# Patient Record
Sex: Female | Born: 1970
Health system: Southern US, Community
[De-identification: ages and names within clinical notes are randomized; demographics above are authoritative.]

## PROBLEM LIST (undated history)

## (undated) DIAGNOSIS — G4700 Insomnia, unspecified: Secondary | ICD-10-CM

## (undated) DIAGNOSIS — Z8719 Personal history of other diseases of the digestive system: Secondary | ICD-10-CM

## (undated) DIAGNOSIS — K219 Gastro-esophageal reflux disease without esophagitis: Secondary | ICD-10-CM

## (undated) DIAGNOSIS — E785 Hyperlipidemia, unspecified: Secondary | ICD-10-CM

## (undated) DIAGNOSIS — M722 Plantar fascial fibromatosis: Secondary | ICD-10-CM

## (undated) DIAGNOSIS — K0889 Other specified disorders of teeth and supporting structures: Secondary | ICD-10-CM

## (undated) DIAGNOSIS — M1990 Unspecified osteoarthritis, unspecified site: Secondary | ICD-10-CM

## (undated) DIAGNOSIS — H3530 Unspecified macular degeneration: Secondary | ICD-10-CM

## (undated) DIAGNOSIS — F329 Major depressive disorder, single episode, unspecified: Secondary | ICD-10-CM

## (undated) DIAGNOSIS — F32A Depression, unspecified: Secondary | ICD-10-CM

## (undated) DIAGNOSIS — Z9889 Other specified postprocedural states: Secondary | ICD-10-CM

## (undated) DIAGNOSIS — F419 Anxiety disorder, unspecified: Secondary | ICD-10-CM

## (undated) DIAGNOSIS — K759 Inflammatory liver disease, unspecified: Secondary | ICD-10-CM

## (undated) DIAGNOSIS — G8929 Other chronic pain: Secondary | ICD-10-CM

## (undated) DIAGNOSIS — I10 Essential (primary) hypertension: Secondary | ICD-10-CM

## (undated) HISTORY — DX: Other specified postprocedural states: Z98.890

## (undated) HISTORY — PX: COLONOSCOPY: SHX174

## (undated) HISTORY — DX: Unspecified macular degeneration: H35.30

## (undated) HISTORY — DX: Major depressive disorder, single episode, unspecified: F32.9

## (undated) HISTORY — DX: Gastro-esophageal reflux disease without esophagitis: K21.9

## (undated) HISTORY — PX: OTHER SURGICAL HISTORY: SHX169

## (undated) HISTORY — DX: Hyperlipidemia, unspecified: E78.5

## (undated) HISTORY — DX: Other chronic pain: G89.29

## (undated) HISTORY — DX: Plantar fascial fibromatosis: M72.2

## (undated) HISTORY — DX: Personal history of other diseases of the digestive system: Z87.19

## (undated) HISTORY — DX: Insomnia, unspecified: G47.00

## (undated) HISTORY — DX: Essential (primary) hypertension: I10

## (undated) HISTORY — DX: Depression, unspecified: F32.A

## (undated) HISTORY — DX: Anxiety disorder, unspecified: F41.9

## (undated) HISTORY — DX: Other specified disorders of teeth and supporting structures: K08.89

---
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
## 1997-09-13 HISTORY — PX: OTHER SURGICAL HISTORY: SHX169

## 1997-11-16 ENCOUNTER — Inpatient Hospital Stay (HOSPITAL_COMMUNITY): Admission: AD | Admit: 1997-11-16 | Discharge: 1997-11-16 | Payer: Self-pay | Admitting: *Deleted

## 1997-12-17 ENCOUNTER — Inpatient Hospital Stay (HOSPITAL_COMMUNITY): Admission: AD | Admit: 1997-12-17 | Discharge: 1997-12-17 | Payer: Self-pay | Admitting: *Deleted

## 1997-12-20 ENCOUNTER — Other Ambulatory Visit: Admission: RE | Admit: 1997-12-20 | Discharge: 1997-12-20 | Payer: Self-pay | Admitting: *Deleted

## 1997-12-24 ENCOUNTER — Inpatient Hospital Stay (HOSPITAL_COMMUNITY): Admission: AD | Admit: 1997-12-24 | Discharge: 1997-12-24 | Payer: Self-pay | Admitting: *Deleted

## 1997-12-26 ENCOUNTER — Inpatient Hospital Stay (HOSPITAL_COMMUNITY): Admission: AD | Admit: 1997-12-26 | Discharge: 1997-12-29 | Payer: Self-pay | Admitting: Obstetrics

## 1998-03-01 ENCOUNTER — Emergency Department (HOSPITAL_COMMUNITY): Admission: EM | Admit: 1998-03-01 | Discharge: 1998-03-01 | Payer: Self-pay | Admitting: Emergency Medicine

## 1998-03-19 ENCOUNTER — Inpatient Hospital Stay (HOSPITAL_COMMUNITY): Admission: AD | Admit: 1998-03-19 | Discharge: 1998-03-19 | Payer: Self-pay | Admitting: Obstetrics

## 1998-03-19 ENCOUNTER — Emergency Department (HOSPITAL_COMMUNITY): Admission: EM | Admit: 1998-03-19 | Discharge: 1998-03-19 | Payer: Self-pay | Admitting: Emergency Medicine

## 1998-04-03 ENCOUNTER — Ambulatory Visit (HOSPITAL_COMMUNITY): Admission: RE | Admit: 1998-04-03 | Discharge: 1998-04-03 | Payer: Self-pay | Admitting: *Deleted

## 1998-04-11 ENCOUNTER — Ambulatory Visit (HOSPITAL_COMMUNITY): Admission: RE | Admit: 1998-04-11 | Discharge: 1998-04-11 | Payer: Self-pay | Admitting: *Deleted

## 1998-04-14 ENCOUNTER — Ambulatory Visit (HOSPITAL_COMMUNITY): Admission: RE | Admit: 1998-04-14 | Discharge: 1998-04-14 | Payer: Self-pay | Admitting: *Deleted

## 1998-05-05 ENCOUNTER — Encounter: Admission: RE | Admit: 1998-05-05 | Discharge: 1998-05-05 | Payer: Self-pay | Admitting: Internal Medicine

## 1998-05-06 ENCOUNTER — Encounter: Admission: RE | Admit: 1998-05-06 | Discharge: 1998-05-06 | Payer: Self-pay | Admitting: Internal Medicine

## 1998-05-10 ENCOUNTER — Emergency Department (HOSPITAL_COMMUNITY): Admission: EM | Admit: 1998-05-10 | Discharge: 1998-05-10 | Payer: Self-pay | Admitting: Emergency Medicine

## 1998-05-13 ENCOUNTER — Ambulatory Visit (HOSPITAL_COMMUNITY): Admission: RE | Admit: 1998-05-13 | Discharge: 1998-05-13 | Payer: Self-pay | Admitting: *Deleted

## 1998-05-22 ENCOUNTER — Ambulatory Visit (HOSPITAL_COMMUNITY): Admission: RE | Admit: 1998-05-22 | Discharge: 1998-05-22 | Payer: Self-pay | Admitting: *Deleted

## 1998-05-22 ENCOUNTER — Encounter: Payer: Self-pay | Admitting: *Deleted

## 1998-05-25 ENCOUNTER — Emergency Department (HOSPITAL_COMMUNITY): Admission: EM | Admit: 1998-05-25 | Discharge: 1998-05-25 | Payer: Self-pay | Admitting: Emergency Medicine

## 1998-05-26 ENCOUNTER — Ambulatory Visit (HOSPITAL_COMMUNITY): Admission: RE | Admit: 1998-05-26 | Discharge: 1998-05-26 | Payer: Self-pay | Admitting: *Deleted

## 1998-06-07 ENCOUNTER — Emergency Department (HOSPITAL_COMMUNITY): Admission: EM | Admit: 1998-06-07 | Discharge: 1998-06-07 | Payer: Self-pay | Admitting: Emergency Medicine

## 1998-06-08 ENCOUNTER — Emergency Department (HOSPITAL_COMMUNITY): Admission: EM | Admit: 1998-06-08 | Discharge: 1998-06-08 | Payer: Self-pay | Admitting: Emergency Medicine

## 1998-06-17 ENCOUNTER — Encounter: Payer: Self-pay | Admitting: *Deleted

## 1998-06-17 ENCOUNTER — Ambulatory Visit (HOSPITAL_COMMUNITY): Admission: RE | Admit: 1998-06-17 | Discharge: 1998-06-17 | Payer: Self-pay | Admitting: *Deleted

## 1998-07-04 ENCOUNTER — Encounter: Payer: Self-pay | Admitting: *Deleted

## 1998-07-04 ENCOUNTER — Ambulatory Visit (HOSPITAL_COMMUNITY): Admission: RE | Admit: 1998-07-04 | Discharge: 1998-07-04 | Payer: Self-pay | Admitting: *Deleted

## 1998-07-05 ENCOUNTER — Inpatient Hospital Stay (HOSPITAL_COMMUNITY): Admission: AD | Admit: 1998-07-05 | Discharge: 1998-07-05 | Payer: Self-pay | Admitting: *Deleted

## 1998-07-10 ENCOUNTER — Encounter: Admission: RE | Admit: 1998-07-10 | Discharge: 1998-07-10 | Payer: Self-pay | Admitting: Obstetrics

## 1998-07-18 ENCOUNTER — Ambulatory Visit (HOSPITAL_COMMUNITY): Admission: RE | Admit: 1998-07-18 | Discharge: 1998-07-18 | Payer: Self-pay | Admitting: *Deleted

## 1998-07-24 ENCOUNTER — Inpatient Hospital Stay (HOSPITAL_COMMUNITY): Admission: AD | Admit: 1998-07-24 | Discharge: 1998-07-24 | Payer: Self-pay | Admitting: Obstetrics & Gynecology

## 1998-08-04 ENCOUNTER — Encounter: Payer: Self-pay | Admitting: Emergency Medicine

## 1998-08-04 ENCOUNTER — Emergency Department (HOSPITAL_COMMUNITY): Admission: EM | Admit: 1998-08-04 | Discharge: 1998-08-04 | Payer: Self-pay | Admitting: Emergency Medicine

## 1998-08-08 ENCOUNTER — Encounter: Payer: Self-pay | Admitting: Obstetrics & Gynecology

## 1998-08-08 ENCOUNTER — Inpatient Hospital Stay (HOSPITAL_COMMUNITY): Admission: AD | Admit: 1998-08-08 | Discharge: 1998-08-08 | Payer: Self-pay | Admitting: Obstetrics & Gynecology

## 1998-08-16 ENCOUNTER — Emergency Department (HOSPITAL_COMMUNITY): Admission: EM | Admit: 1998-08-16 | Discharge: 1998-08-16 | Payer: Self-pay | Admitting: Emergency Medicine

## 1998-08-21 ENCOUNTER — Encounter: Admission: RE | Admit: 1998-08-21 | Discharge: 1998-08-21 | Payer: Self-pay | Admitting: Obstetrics

## 1998-08-22 ENCOUNTER — Ambulatory Visit (HOSPITAL_COMMUNITY): Admission: RE | Admit: 1998-08-22 | Discharge: 1998-08-22 | Payer: Self-pay | Admitting: *Deleted

## 1998-08-22 ENCOUNTER — Encounter: Payer: Self-pay | Admitting: *Deleted

## 1998-09-02 ENCOUNTER — Ambulatory Visit (HOSPITAL_COMMUNITY): Admission: RE | Admit: 1998-09-02 | Discharge: 1998-09-02 | Payer: Self-pay | Admitting: *Deleted

## 1998-10-31 ENCOUNTER — Ambulatory Visit (HOSPITAL_COMMUNITY): Admission: RE | Admit: 1998-10-31 | Discharge: 1998-10-31 | Payer: Self-pay | Admitting: *Deleted

## 1998-10-31 ENCOUNTER — Encounter: Payer: Self-pay | Admitting: *Deleted

## 1998-11-07 ENCOUNTER — Ambulatory Visit (HOSPITAL_COMMUNITY): Admission: RE | Admit: 1998-11-07 | Discharge: 1998-11-07 | Payer: Self-pay | Admitting: *Deleted

## 1998-11-21 ENCOUNTER — Emergency Department (HOSPITAL_COMMUNITY): Admission: EM | Admit: 1998-11-21 | Discharge: 1998-11-22 | Payer: Self-pay | Admitting: Emergency Medicine

## 1998-11-22 ENCOUNTER — Encounter: Payer: Self-pay | Admitting: Emergency Medicine

## 1998-12-16 ENCOUNTER — Encounter: Admission: RE | Admit: 1998-12-16 | Discharge: 1998-12-16 | Payer: Self-pay | Admitting: Hematology and Oncology

## 1999-02-17 ENCOUNTER — Encounter: Admission: RE | Admit: 1999-02-17 | Discharge: 1999-02-17 | Payer: Self-pay | Admitting: Internal Medicine

## 1999-02-24 ENCOUNTER — Ambulatory Visit (HOSPITAL_COMMUNITY): Admission: RE | Admit: 1999-02-24 | Discharge: 1999-02-24 | Payer: Self-pay | Admitting: *Deleted

## 1999-03-19 ENCOUNTER — Encounter: Admission: RE | Admit: 1999-03-19 | Discharge: 1999-03-19 | Payer: Self-pay | Admitting: Obstetrics

## 1999-04-02 ENCOUNTER — Encounter: Admission: RE | Admit: 1999-04-02 | Discharge: 1999-04-02 | Payer: Self-pay | Admitting: Obstetrics

## 1999-04-24 ENCOUNTER — Encounter: Admission: RE | Admit: 1999-04-24 | Discharge: 1999-04-24 | Payer: Self-pay | Admitting: Internal Medicine

## 1999-07-02 ENCOUNTER — Encounter: Admission: RE | Admit: 1999-07-02 | Discharge: 1999-07-02 | Payer: Self-pay | Admitting: Obstetrics

## 1999-07-17 ENCOUNTER — Ambulatory Visit (HOSPITAL_COMMUNITY): Admission: RE | Admit: 1999-07-17 | Discharge: 1999-07-17 | Payer: Self-pay | Admitting: *Deleted

## 1999-09-17 ENCOUNTER — Encounter: Admission: RE | Admit: 1999-09-17 | Discharge: 1999-09-17 | Payer: Self-pay | Admitting: Obstetrics

## 1999-09-29 ENCOUNTER — Other Ambulatory Visit: Admission: RE | Admit: 1999-09-29 | Discharge: 1999-09-29 | Payer: Self-pay | Admitting: Obstetrics

## 1999-09-29 ENCOUNTER — Encounter: Admission: RE | Admit: 1999-09-29 | Discharge: 1999-09-29 | Payer: Self-pay | Admitting: Obstetrics & Gynecology

## 1999-12-24 ENCOUNTER — Encounter: Admission: RE | Admit: 1999-12-24 | Discharge: 1999-12-24 | Payer: Self-pay | Admitting: Obstetrics

## 2000-02-06 ENCOUNTER — Inpatient Hospital Stay (HOSPITAL_COMMUNITY): Admission: AD | Admit: 2000-02-06 | Discharge: 2000-02-06 | Payer: Self-pay | Admitting: *Deleted

## 2000-03-24 ENCOUNTER — Encounter: Payer: Self-pay | Admitting: Emergency Medicine

## 2000-03-24 ENCOUNTER — Encounter: Admission: RE | Admit: 2000-03-24 | Discharge: 2000-03-24 | Payer: Self-pay | Admitting: Obstetrics

## 2000-03-24 ENCOUNTER — Emergency Department (HOSPITAL_COMMUNITY): Admission: EM | Admit: 2000-03-24 | Discharge: 2000-03-24 | Payer: Self-pay | Admitting: Emergency Medicine

## 2000-05-18 ENCOUNTER — Encounter: Admission: RE | Admit: 2000-05-18 | Discharge: 2000-05-18 | Payer: Self-pay | Admitting: Hematology and Oncology

## 2000-05-27 ENCOUNTER — Ambulatory Visit (HOSPITAL_COMMUNITY): Admission: RE | Admit: 2000-05-27 | Discharge: 2000-05-27 | Payer: Self-pay | Admitting: *Deleted

## 2000-06-06 ENCOUNTER — Ambulatory Visit (HOSPITAL_COMMUNITY): Admission: RE | Admit: 2000-06-06 | Discharge: 2000-06-06 | Payer: Self-pay | Admitting: *Deleted

## 2000-06-21 ENCOUNTER — Ambulatory Visit (HOSPITAL_COMMUNITY): Admission: RE | Admit: 2000-06-21 | Discharge: 2000-06-21 | Payer: Self-pay | Admitting: Internal Medicine

## 2000-08-02 ENCOUNTER — Ambulatory Visit (HOSPITAL_COMMUNITY): Admission: RE | Admit: 2000-08-02 | Discharge: 2000-08-02 | Payer: Self-pay | Admitting: *Deleted

## 2000-08-25 ENCOUNTER — Encounter: Admission: RE | Admit: 2000-08-25 | Discharge: 2000-08-25 | Payer: Self-pay | Admitting: Internal Medicine

## 2000-09-08 ENCOUNTER — Encounter: Admission: RE | Admit: 2000-09-08 | Discharge: 2000-09-08 | Payer: Self-pay | Admitting: Internal Medicine

## 2000-09-14 ENCOUNTER — Encounter: Payer: Self-pay | Admitting: Internal Medicine

## 2000-09-14 ENCOUNTER — Ambulatory Visit (HOSPITAL_COMMUNITY): Admission: RE | Admit: 2000-09-14 | Discharge: 2000-09-14 | Payer: Self-pay | Admitting: Internal Medicine

## 2000-09-27 ENCOUNTER — Other Ambulatory Visit: Admission: RE | Admit: 2000-09-27 | Discharge: 2000-09-27 | Payer: Self-pay | Admitting: Obstetrics & Gynecology

## 2000-09-27 ENCOUNTER — Encounter: Admission: RE | Admit: 2000-09-27 | Discharge: 2000-09-27 | Payer: Self-pay | Admitting: Obstetrics & Gynecology

## 2000-11-03 ENCOUNTER — Emergency Department (HOSPITAL_COMMUNITY): Admission: EM | Admit: 2000-11-03 | Discharge: 2000-11-04 | Payer: Self-pay | Admitting: Emergency Medicine

## 2000-11-09 ENCOUNTER — Encounter: Admission: RE | Admit: 2000-11-09 | Discharge: 2000-11-09 | Payer: Self-pay | Admitting: Internal Medicine

## 2000-11-26 ENCOUNTER — Emergency Department (HOSPITAL_COMMUNITY): Admission: EM | Admit: 2000-11-26 | Discharge: 2000-11-26 | Payer: Self-pay | Admitting: *Deleted

## 2000-12-10 ENCOUNTER — Emergency Department (HOSPITAL_COMMUNITY): Admission: EM | Admit: 2000-12-10 | Discharge: 2000-12-10 | Payer: Self-pay | Admitting: Emergency Medicine

## 2000-12-13 ENCOUNTER — Emergency Department (HOSPITAL_COMMUNITY): Admission: EM | Admit: 2000-12-13 | Discharge: 2000-12-14 | Payer: Self-pay | Admitting: Emergency Medicine

## 2000-12-15 ENCOUNTER — Encounter: Payer: Self-pay | Admitting: Internal Medicine

## 2000-12-15 ENCOUNTER — Encounter: Admission: RE | Admit: 2000-12-15 | Discharge: 2000-12-15 | Payer: Self-pay | Admitting: Internal Medicine

## 2000-12-15 ENCOUNTER — Inpatient Hospital Stay (HOSPITAL_COMMUNITY): Admission: AD | Admit: 2000-12-15 | Discharge: 2000-12-18 | Payer: Self-pay | Admitting: Internal Medicine

## 2000-12-23 ENCOUNTER — Encounter: Admission: RE | Admit: 2000-12-23 | Discharge: 2000-12-23 | Payer: Self-pay

## 2000-12-30 ENCOUNTER — Encounter: Admission: RE | Admit: 2000-12-30 | Discharge: 2000-12-30 | Payer: Self-pay

## 2001-01-13 ENCOUNTER — Encounter: Admission: RE | Admit: 2001-01-13 | Discharge: 2001-01-13 | Payer: Self-pay | Admitting: Internal Medicine

## 2001-01-20 ENCOUNTER — Encounter: Admission: RE | Admit: 2001-01-20 | Discharge: 2001-01-20 | Payer: Self-pay | Admitting: Internal Medicine

## 2001-03-13 ENCOUNTER — Ambulatory Visit (HOSPITAL_COMMUNITY): Admission: RE | Admit: 2001-03-13 | Discharge: 2001-03-13 | Payer: Self-pay | Admitting: *Deleted

## 2001-03-24 ENCOUNTER — Encounter: Admission: RE | Admit: 2001-03-24 | Discharge: 2001-03-24 | Payer: Self-pay | Admitting: Obstetrics & Gynecology

## 2001-03-28 ENCOUNTER — Encounter: Admission: RE | Admit: 2001-03-28 | Discharge: 2001-03-28 | Payer: Self-pay | Admitting: Internal Medicine

## 2001-04-12 ENCOUNTER — Encounter: Admission: RE | Admit: 2001-04-12 | Discharge: 2001-04-12 | Payer: Self-pay | Admitting: Internal Medicine

## 2001-07-10 ENCOUNTER — Encounter: Admission: RE | Admit: 2001-07-10 | Discharge: 2001-07-10 | Payer: Self-pay

## 2001-07-31 ENCOUNTER — Ambulatory Visit (HOSPITAL_COMMUNITY): Admission: RE | Admit: 2001-07-31 | Discharge: 2001-07-31 | Payer: Self-pay | Admitting: *Deleted

## 2001-07-31 ENCOUNTER — Encounter: Payer: Self-pay | Admitting: *Deleted

## 2001-08-21 ENCOUNTER — Encounter: Admission: RE | Admit: 2001-08-21 | Discharge: 2001-08-21 | Payer: Self-pay | Admitting: Internal Medicine

## 2001-10-03 ENCOUNTER — Ambulatory Visit (HOSPITAL_COMMUNITY): Admission: RE | Admit: 2001-10-03 | Discharge: 2001-10-03 | Payer: Self-pay

## 2001-10-03 ENCOUNTER — Encounter: Payer: Self-pay | Admitting: Internal Medicine

## 2001-10-03 ENCOUNTER — Encounter: Admission: RE | Admit: 2001-10-03 | Discharge: 2001-10-03 | Payer: Self-pay | Admitting: Obstetrics & Gynecology

## 2001-11-30 ENCOUNTER — Encounter: Admission: RE | Admit: 2001-11-30 | Discharge: 2001-11-30 | Payer: Self-pay | Admitting: Internal Medicine

## 2001-12-12 ENCOUNTER — Ambulatory Visit (HOSPITAL_COMMUNITY): Admission: RE | Admit: 2001-12-12 | Discharge: 2001-12-12 | Payer: Self-pay | Admitting: *Deleted

## 2001-12-19 ENCOUNTER — Ambulatory Visit (HOSPITAL_COMMUNITY): Admission: RE | Admit: 2001-12-19 | Discharge: 2001-12-19 | Payer: Self-pay | Admitting: *Deleted

## 2001-12-26 ENCOUNTER — Encounter: Admission: RE | Admit: 2001-12-26 | Discharge: 2001-12-26 | Payer: Self-pay | Admitting: *Deleted

## 2001-12-28 ENCOUNTER — Encounter: Admission: RE | Admit: 2001-12-28 | Discharge: 2001-12-28 | Payer: Self-pay | Admitting: *Deleted

## 2002-02-05 ENCOUNTER — Encounter: Payer: Self-pay | Admitting: Internal Medicine

## 2002-02-05 ENCOUNTER — Ambulatory Visit (HOSPITAL_COMMUNITY): Admission: RE | Admit: 2002-02-05 | Discharge: 2002-02-05 | Payer: Self-pay | Admitting: Internal Medicine

## 2002-02-05 ENCOUNTER — Encounter: Admission: RE | Admit: 2002-02-05 | Discharge: 2002-02-05 | Payer: Self-pay | Admitting: Internal Medicine

## 2002-02-06 ENCOUNTER — Encounter: Admission: RE | Admit: 2002-02-06 | Discharge: 2002-02-06 | Payer: Self-pay | Admitting: Internal Medicine

## 2002-03-06 ENCOUNTER — Encounter: Admission: RE | Admit: 2002-03-06 | Discharge: 2002-03-06 | Payer: Self-pay | Admitting: Internal Medicine

## 2002-03-26 ENCOUNTER — Encounter: Admission: RE | Admit: 2002-03-26 | Discharge: 2002-03-26 | Payer: Self-pay | Admitting: Internal Medicine

## 2002-04-09 ENCOUNTER — Encounter: Admission: RE | Admit: 2002-04-09 | Discharge: 2002-04-09 | Payer: Self-pay | Admitting: Internal Medicine

## 2002-07-05 ENCOUNTER — Encounter: Admission: RE | Admit: 2002-07-05 | Discharge: 2002-07-05 | Payer: Self-pay | Admitting: Obstetrics and Gynecology

## 2002-12-02 ENCOUNTER — Emergency Department (HOSPITAL_COMMUNITY): Admission: EM | Admit: 2002-12-02 | Discharge: 2002-12-03 | Payer: Self-pay | Admitting: Emergency Medicine

## 2003-02-15 ENCOUNTER — Encounter: Admission: RE | Admit: 2003-02-15 | Discharge: 2003-02-15 | Payer: Self-pay | Admitting: Internal Medicine

## 2003-03-11 ENCOUNTER — Encounter: Admission: RE | Admit: 2003-03-11 | Discharge: 2003-03-11 | Payer: Self-pay | Admitting: Internal Medicine

## 2003-05-16 ENCOUNTER — Encounter: Admission: RE | Admit: 2003-05-16 | Discharge: 2003-05-16 | Payer: Self-pay | Admitting: Internal Medicine

## 2003-05-16 ENCOUNTER — Encounter: Admission: RE | Admit: 2003-05-16 | Discharge: 2003-05-16 | Payer: Self-pay | Admitting: Obstetrics and Gynecology

## 2003-07-18 ENCOUNTER — Encounter: Admission: RE | Admit: 2003-07-18 | Discharge: 2003-07-18 | Payer: Self-pay | Admitting: Internal Medicine

## 2003-07-18 ENCOUNTER — Ambulatory Visit (HOSPITAL_COMMUNITY): Admission: RE | Admit: 2003-07-18 | Discharge: 2003-07-18 | Payer: Self-pay | Admitting: Internal Medicine

## 2003-07-30 ENCOUNTER — Encounter: Admission: RE | Admit: 2003-07-30 | Discharge: 2003-10-28 | Payer: Self-pay | Admitting: Internal Medicine

## 2003-08-30 ENCOUNTER — Encounter: Admission: RE | Admit: 2003-08-30 | Discharge: 2003-08-30 | Payer: Self-pay | Admitting: Internal Medicine

## 2003-09-14 HISTORY — PX: OTHER SURGICAL HISTORY: SHX169

## 2003-09-23 ENCOUNTER — Encounter: Admission: RE | Admit: 2003-09-23 | Discharge: 2003-09-23 | Payer: Self-pay | Admitting: Internal Medicine

## 2003-10-02 ENCOUNTER — Encounter: Admission: RE | Admit: 2003-10-02 | Discharge: 2003-10-02 | Payer: Self-pay | Admitting: Internal Medicine

## 2003-11-01 ENCOUNTER — Encounter: Admission: RE | Admit: 2003-11-01 | Discharge: 2003-11-01 | Payer: Self-pay | Admitting: Internal Medicine

## 2003-11-06 ENCOUNTER — Ambulatory Visit (HOSPITAL_COMMUNITY): Admission: RE | Admit: 2003-11-06 | Discharge: 2003-11-06 | Payer: Self-pay | Admitting: Gastroenterology

## 2003-11-13 ENCOUNTER — Ambulatory Visit (HOSPITAL_COMMUNITY): Admission: RE | Admit: 2003-11-13 | Discharge: 2003-11-13 | Payer: Self-pay | Admitting: Gastroenterology

## 2003-11-29 ENCOUNTER — Encounter: Admission: RE | Admit: 2003-11-29 | Discharge: 2004-02-27 | Payer: Self-pay | Admitting: Orthopedic Surgery

## 2004-01-08 ENCOUNTER — Ambulatory Visit (HOSPITAL_BASED_OUTPATIENT_CLINIC_OR_DEPARTMENT_OTHER): Admission: RE | Admit: 2004-01-08 | Discharge: 2004-01-08 | Payer: Self-pay | Admitting: Orthopedic Surgery

## 2004-05-14 ENCOUNTER — Ambulatory Visit (HOSPITAL_COMMUNITY): Admission: RE | Admit: 2004-05-14 | Discharge: 2004-05-14 | Payer: Self-pay | Admitting: Internal Medicine

## 2004-05-14 ENCOUNTER — Ambulatory Visit: Payer: Self-pay | Admitting: Internal Medicine

## 2004-05-28 ENCOUNTER — Ambulatory Visit: Payer: Self-pay | Admitting: Internal Medicine

## 2004-06-15 ENCOUNTER — Ambulatory Visit: Payer: Self-pay | Admitting: Internal Medicine

## 2004-07-16 ENCOUNTER — Ambulatory Visit: Payer: Self-pay | Admitting: Internal Medicine

## 2004-07-31 ENCOUNTER — Ambulatory Visit: Payer: Self-pay | Admitting: Family Medicine

## 2004-08-17 ENCOUNTER — Ambulatory Visit: Payer: Self-pay | Admitting: Internal Medicine

## 2004-08-24 ENCOUNTER — Ambulatory Visit: Payer: Self-pay | Admitting: Internal Medicine

## 2004-09-02 ENCOUNTER — Ambulatory Visit: Payer: Self-pay | Admitting: Internal Medicine

## 2004-10-27 ENCOUNTER — Ambulatory Visit (HOSPITAL_COMMUNITY): Admission: RE | Admit: 2004-10-27 | Discharge: 2004-10-27 | Payer: Self-pay | Admitting: Gastroenterology

## 2004-11-18 ENCOUNTER — Ambulatory Visit (HOSPITAL_COMMUNITY): Admission: RE | Admit: 2004-11-18 | Discharge: 2004-11-18 | Payer: Self-pay | Admitting: Gastroenterology

## 2004-12-10 ENCOUNTER — Emergency Department (HOSPITAL_COMMUNITY): Admission: EM | Admit: 2004-12-10 | Discharge: 2004-12-10 | Payer: Self-pay | Admitting: Family Medicine

## 2004-12-11 ENCOUNTER — Emergency Department (HOSPITAL_COMMUNITY): Admission: EM | Admit: 2004-12-11 | Discharge: 2004-12-11 | Payer: Self-pay | Admitting: Family Medicine

## 2005-02-03 ENCOUNTER — Ambulatory Visit: Payer: Self-pay | Admitting: Internal Medicine

## 2005-02-17 ENCOUNTER — Ambulatory Visit: Payer: Self-pay | Admitting: Internal Medicine

## 2005-02-18 ENCOUNTER — Ambulatory Visit (HOSPITAL_COMMUNITY): Admission: RE | Admit: 2005-02-18 | Discharge: 2005-02-18 | Payer: Self-pay | Admitting: Internal Medicine

## 2005-02-19 ENCOUNTER — Ambulatory Visit: Payer: Self-pay | Admitting: Internal Medicine

## 2005-04-15 ENCOUNTER — Encounter: Admission: RE | Admit: 2005-04-15 | Discharge: 2005-05-10 | Payer: Self-pay | Admitting: Internal Medicine

## 2005-05-04 ENCOUNTER — Emergency Department (HOSPITAL_COMMUNITY): Admission: EM | Admit: 2005-05-04 | Discharge: 2005-05-04 | Payer: Self-pay | Admitting: Family Medicine

## 2005-05-11 ENCOUNTER — Ambulatory Visit: Payer: Self-pay | Admitting: Internal Medicine

## 2005-05-11 ENCOUNTER — Ambulatory Visit (HOSPITAL_COMMUNITY): Admission: RE | Admit: 2005-05-11 | Discharge: 2005-05-11 | Payer: Self-pay | Admitting: Internal Medicine

## 2005-05-19 ENCOUNTER — Ambulatory Visit: Payer: Self-pay | Admitting: Internal Medicine

## 2005-06-08 ENCOUNTER — Encounter: Admission: RE | Admit: 2005-06-08 | Discharge: 2005-08-11 | Payer: Self-pay | Admitting: Orthopedic Surgery

## 2005-06-09 ENCOUNTER — Inpatient Hospital Stay (HOSPITAL_COMMUNITY): Admission: AD | Admit: 2005-06-09 | Discharge: 2005-06-09 | Payer: Self-pay | Admitting: Obstetrics and Gynecology

## 2005-06-09 ENCOUNTER — Ambulatory Visit: Payer: Self-pay | Admitting: Internal Medicine

## 2005-07-13 ENCOUNTER — Ambulatory Visit: Payer: Self-pay | Admitting: Obstetrics and Gynecology

## 2005-08-12 ENCOUNTER — Encounter (INDEPENDENT_AMBULATORY_CARE_PROVIDER_SITE_OTHER): Payer: Self-pay | Admitting: Internal Medicine

## 2005-08-12 ENCOUNTER — Ambulatory Visit: Payer: Self-pay | Admitting: Obstetrics and Gynecology

## 2005-12-15 ENCOUNTER — Ambulatory Visit: Payer: Self-pay | Admitting: Internal Medicine

## 2006-05-05 ENCOUNTER — Ambulatory Visit (HOSPITAL_COMMUNITY): Admission: RE | Admit: 2006-05-05 | Discharge: 2006-05-05 | Payer: Self-pay | Admitting: Gastroenterology

## 2006-05-12 ENCOUNTER — Ambulatory Visit: Payer: Self-pay | Admitting: Internal Medicine

## 2006-05-20 ENCOUNTER — Ambulatory Visit (HOSPITAL_COMMUNITY): Admission: RE | Admit: 2006-05-20 | Discharge: 2006-05-20 | Payer: Self-pay | Admitting: Hospitalist

## 2006-05-20 ENCOUNTER — Ambulatory Visit: Payer: Self-pay | Admitting: Hospitalist

## 2006-06-16 ENCOUNTER — Ambulatory Visit (HOSPITAL_COMMUNITY): Admission: RE | Admit: 2006-06-16 | Discharge: 2006-06-16 | Payer: Self-pay | Admitting: Gastroenterology

## 2006-07-07 ENCOUNTER — Ambulatory Visit: Payer: Self-pay | Admitting: Internal Medicine

## 2006-10-12 ENCOUNTER — Encounter (INDEPENDENT_AMBULATORY_CARE_PROVIDER_SITE_OTHER): Payer: Self-pay | Admitting: Internal Medicine

## 2006-10-12 DIAGNOSIS — G4700 Insomnia, unspecified: Secondary | ICD-10-CM | POA: Insufficient documentation

## 2006-10-12 DIAGNOSIS — K222 Esophageal obstruction: Secondary | ICD-10-CM | POA: Insufficient documentation

## 2006-10-12 DIAGNOSIS — F3289 Other specified depressive episodes: Secondary | ICD-10-CM | POA: Insufficient documentation

## 2006-10-12 DIAGNOSIS — K219 Gastro-esophageal reflux disease without esophagitis: Secondary | ICD-10-CM | POA: Insufficient documentation

## 2006-10-12 DIAGNOSIS — F17200 Nicotine dependence, unspecified, uncomplicated: Secondary | ICD-10-CM

## 2006-10-12 DIAGNOSIS — F329 Major depressive disorder, single episode, unspecified: Secondary | ICD-10-CM | POA: Insufficient documentation

## 2006-10-12 DIAGNOSIS — F411 Generalized anxiety disorder: Secondary | ICD-10-CM | POA: Insufficient documentation

## 2006-10-13 ENCOUNTER — Ambulatory Visit: Payer: Self-pay | Admitting: Internal Medicine

## 2006-10-27 ENCOUNTER — Ambulatory Visit: Payer: Self-pay | Admitting: Obstetrics & Gynecology

## 2006-10-27 ENCOUNTER — Encounter (INDEPENDENT_AMBULATORY_CARE_PROVIDER_SITE_OTHER): Payer: Self-pay | Admitting: *Deleted

## 2006-11-03 ENCOUNTER — Ambulatory Visit (HOSPITAL_COMMUNITY): Admission: RE | Admit: 2006-11-03 | Discharge: 2006-11-03 | Payer: Self-pay | Admitting: Hospitalist

## 2006-11-03 ENCOUNTER — Ambulatory Visit: Payer: Self-pay | Admitting: Hospitalist

## 2006-11-09 ENCOUNTER — Ambulatory Visit: Payer: Self-pay | Admitting: Internal Medicine

## 2006-11-09 ENCOUNTER — Encounter (INDEPENDENT_AMBULATORY_CARE_PROVIDER_SITE_OTHER): Payer: Self-pay | Admitting: Pulmonary Disease

## 2006-11-09 LAB — CONVERTED CEMR LAB
Cholesterol: 207 mg/dL — ABNORMAL HIGH (ref 0–200)
HDL: 45 mg/dL (ref >39)
LDL Cholesterol: 143 mg/dL — ABNORMAL HIGH (ref 0–99)
Triglycerides: 96 mg/dL (ref <150)
VLDL: 19 mg/dL (ref 0–40)

## 2006-11-23 ENCOUNTER — Ambulatory Visit: Payer: Self-pay | Admitting: *Deleted

## 2006-12-06 ENCOUNTER — Encounter (INDEPENDENT_AMBULATORY_CARE_PROVIDER_SITE_OTHER): Payer: Self-pay | Admitting: Internal Medicine

## 2006-12-27 ENCOUNTER — Encounter (INDEPENDENT_AMBULATORY_CARE_PROVIDER_SITE_OTHER): Payer: Self-pay | Admitting: Internal Medicine

## 2007-01-06 ENCOUNTER — Telehealth: Payer: Self-pay | Admitting: *Deleted

## 2007-01-16 ENCOUNTER — Ambulatory Visit (HOSPITAL_COMMUNITY): Admission: RE | Admit: 2007-01-16 | Discharge: 2007-01-16 | Payer: Self-pay | Admitting: *Deleted

## 2007-01-16 ENCOUNTER — Encounter (INDEPENDENT_AMBULATORY_CARE_PROVIDER_SITE_OTHER): Payer: Self-pay | Admitting: *Deleted

## 2007-01-16 ENCOUNTER — Telehealth: Payer: Self-pay | Admitting: *Deleted

## 2007-01-16 ENCOUNTER — Ambulatory Visit: Payer: Self-pay | Admitting: *Deleted

## 2007-01-17 ENCOUNTER — Encounter (INDEPENDENT_AMBULATORY_CARE_PROVIDER_SITE_OTHER): Payer: Self-pay | Admitting: *Deleted

## 2007-01-17 LAB — CONVERTED CEMR LAB
ALT: 21 units/L (ref 0–35)
AST: 11 units/L (ref 0–37)
Albumin: 4 g/dL (ref 3.5–5.2)
Alkaline Phosphatase: 57 units/L (ref 39–117)
BUN: 5 mg/dL — ABNORMAL LOW (ref 6–23)
Calcium: 9.1 mg/dL (ref 8.4–10.5)
Chloride: 100 meq/L (ref 96–112)
Creatinine, Ser: 0.57 mg/dL (ref 0.40–1.20)
MCHC: 33.8 g/dL (ref 30.0–36.0)
Platelets: 310 10*3/uL (ref 150–400)
Potassium: 3.3 meq/L — ABNORMAL LOW (ref 3.5–5.3)
RDW: 12.1 % (ref 11.5–14.0)

## 2007-01-19 ENCOUNTER — Ambulatory Visit (HOSPITAL_COMMUNITY): Admission: RE | Admit: 2007-01-19 | Discharge: 2007-01-19 | Payer: Self-pay | Admitting: *Deleted

## 2007-02-17 ENCOUNTER — Encounter (INDEPENDENT_AMBULATORY_CARE_PROVIDER_SITE_OTHER): Payer: Self-pay | Admitting: Internal Medicine

## 2007-02-17 ENCOUNTER — Encounter: Admission: RE | Admit: 2007-02-17 | Discharge: 2007-02-17 | Payer: Self-pay | Admitting: Gastroenterology

## 2007-02-17 ENCOUNTER — Ambulatory Visit: Payer: Self-pay | Admitting: Internal Medicine

## 2007-03-13 ENCOUNTER — Encounter (INDEPENDENT_AMBULATORY_CARE_PROVIDER_SITE_OTHER): Payer: Self-pay | Admitting: Internal Medicine

## 2007-05-18 ENCOUNTER — Ambulatory Visit (HOSPITAL_COMMUNITY): Admission: RE | Admit: 2007-05-18 | Discharge: 2007-05-18 | Payer: Self-pay | Admitting: Gastroenterology

## 2007-05-18 ENCOUNTER — Encounter (INDEPENDENT_AMBULATORY_CARE_PROVIDER_SITE_OTHER): Payer: Self-pay | Admitting: Gastroenterology

## 2007-05-18 ENCOUNTER — Encounter (INDEPENDENT_AMBULATORY_CARE_PROVIDER_SITE_OTHER): Payer: Self-pay | Admitting: Internal Medicine

## 2007-05-26 ENCOUNTER — Telehealth (INDEPENDENT_AMBULATORY_CARE_PROVIDER_SITE_OTHER): Payer: Self-pay | Admitting: Internal Medicine

## 2007-06-05 ENCOUNTER — Encounter (INDEPENDENT_AMBULATORY_CARE_PROVIDER_SITE_OTHER): Payer: Self-pay | Admitting: Internal Medicine

## 2007-08-17 ENCOUNTER — Ambulatory Visit: Payer: Self-pay | Admitting: Internal Medicine

## 2007-08-24 ENCOUNTER — Encounter (INDEPENDENT_AMBULATORY_CARE_PROVIDER_SITE_OTHER): Payer: Self-pay | Admitting: Internal Medicine

## 2007-08-24 ENCOUNTER — Ambulatory Visit: Payer: Self-pay | Admitting: Infectious Diseases

## 2007-09-22 ENCOUNTER — Encounter (INDEPENDENT_AMBULATORY_CARE_PROVIDER_SITE_OTHER): Payer: Self-pay | Admitting: Internal Medicine

## 2007-11-02 ENCOUNTER — Ambulatory Visit: Payer: Self-pay | Admitting: Obstetrics and Gynecology

## 2007-11-02 ENCOUNTER — Encounter: Payer: Self-pay | Admitting: Family Medicine

## 2008-04-22 ENCOUNTER — Emergency Department (HOSPITAL_COMMUNITY): Admission: EM | Admit: 2008-04-22 | Discharge: 2008-04-22 | Payer: Self-pay | Admitting: Family Medicine

## 2008-05-02 ENCOUNTER — Ambulatory Visit: Payer: Self-pay | Admitting: Obstetrics and Gynecology

## 2008-05-13 ENCOUNTER — Encounter (INDEPENDENT_AMBULATORY_CARE_PROVIDER_SITE_OTHER): Payer: Self-pay | Admitting: Internal Medicine

## 2008-05-13 ENCOUNTER — Telehealth: Payer: Self-pay | Admitting: *Deleted

## 2008-05-17 ENCOUNTER — Encounter (INDEPENDENT_AMBULATORY_CARE_PROVIDER_SITE_OTHER): Payer: Self-pay | Admitting: Internal Medicine

## 2008-05-17 ENCOUNTER — Emergency Department (HOSPITAL_COMMUNITY): Admission: EM | Admit: 2008-05-17 | Discharge: 2008-05-17 | Payer: Self-pay | Admitting: Family Medicine

## 2008-05-27 ENCOUNTER — Encounter (INDEPENDENT_AMBULATORY_CARE_PROVIDER_SITE_OTHER): Payer: Self-pay | Admitting: Internal Medicine

## 2008-07-12 ENCOUNTER — Encounter (INDEPENDENT_AMBULATORY_CARE_PROVIDER_SITE_OTHER): Payer: Self-pay | Admitting: Internal Medicine

## 2008-07-16 ENCOUNTER — Ambulatory Visit: Payer: Self-pay | Admitting: Internal Medicine

## 2008-07-19 ENCOUNTER — Ambulatory Visit (HOSPITAL_COMMUNITY): Admission: RE | Admit: 2008-07-19 | Discharge: 2008-07-19 | Payer: Self-pay | Admitting: Infectious Diseases

## 2008-07-19 ENCOUNTER — Ambulatory Visit: Payer: Self-pay | Admitting: Infectious Diseases

## 2008-07-23 ENCOUNTER — Telehealth (INDEPENDENT_AMBULATORY_CARE_PROVIDER_SITE_OTHER): Payer: Self-pay | Admitting: Internal Medicine

## 2008-07-26 ENCOUNTER — Ambulatory Visit (HOSPITAL_BASED_OUTPATIENT_CLINIC_OR_DEPARTMENT_OTHER): Admission: RE | Admit: 2008-07-26 | Discharge: 2008-07-26 | Payer: Self-pay | Admitting: Urology

## 2008-09-13 DIAGNOSIS — K0889 Other specified disorders of teeth and supporting structures: Secondary | ICD-10-CM

## 2008-09-13 HISTORY — DX: Other specified disorders of teeth and supporting structures: K08.89

## 2008-09-17 ENCOUNTER — Telehealth: Payer: Self-pay | Admitting: *Deleted

## 2008-09-24 ENCOUNTER — Encounter: Admission: RE | Admit: 2008-09-24 | Discharge: 2008-12-18 | Payer: Self-pay | Admitting: Urology

## 2008-10-02 ENCOUNTER — Encounter: Payer: Self-pay | Admitting: Family

## 2008-10-02 ENCOUNTER — Ambulatory Visit: Payer: Self-pay | Admitting: Obstetrics and Gynecology

## 2008-10-02 LAB — CONVERTED CEMR LAB
Trich, Wet Prep: NONE SEEN
Yeast Wet Prep HPF POC: NONE SEEN

## 2008-11-12 ENCOUNTER — Telehealth: Payer: Self-pay | Admitting: *Deleted

## 2008-12-05 ENCOUNTER — Ambulatory Visit: Payer: Self-pay | Admitting: Family Medicine

## 2008-12-05 ENCOUNTER — Encounter (INDEPENDENT_AMBULATORY_CARE_PROVIDER_SITE_OTHER): Payer: Self-pay | Admitting: Family Medicine

## 2009-01-02 ENCOUNTER — Ambulatory Visit: Payer: Self-pay | Admitting: Internal Medicine

## 2009-01-02 DIAGNOSIS — T22039A Burn of unspecified degree of unspecified upper arm, initial encounter: Secondary | ICD-10-CM | POA: Insufficient documentation

## 2009-04-16 ENCOUNTER — Encounter (INDEPENDENT_AMBULATORY_CARE_PROVIDER_SITE_OTHER): Payer: Self-pay | Admitting: Internal Medicine

## 2009-04-21 ENCOUNTER — Encounter (INDEPENDENT_AMBULATORY_CARE_PROVIDER_SITE_OTHER): Payer: Self-pay | Admitting: Internal Medicine

## 2009-05-13 ENCOUNTER — Ambulatory Visit: Payer: Self-pay | Admitting: Internal Medicine

## 2009-05-13 ENCOUNTER — Telehealth: Payer: Self-pay | Admitting: Internal Medicine

## 2009-05-13 ENCOUNTER — Ambulatory Visit (HOSPITAL_COMMUNITY): Admission: RE | Admit: 2009-05-13 | Discharge: 2009-05-13 | Payer: Self-pay | Admitting: Internal Medicine

## 2009-05-13 DIAGNOSIS — N3010 Interstitial cystitis (chronic) without hematuria: Secondary | ICD-10-CM | POA: Insufficient documentation

## 2009-05-13 DIAGNOSIS — M549 Dorsalgia, unspecified: Secondary | ICD-10-CM | POA: Insufficient documentation

## 2009-05-22 ENCOUNTER — Ambulatory Visit: Payer: Self-pay | Admitting: Internal Medicine

## 2009-05-26 LAB — CONVERTED CEMR LAB
ALT: 14 units/L (ref 0–35)
Albumin: 4.3 g/dL (ref 3.5–5.2)
CO2: 26 meq/L (ref 19–32)
Calcium: 9 mg/dL (ref 8.4–10.5)
Chloride: 101 meq/L (ref 96–112)
Cholesterol: 219 mg/dL — ABNORMAL HIGH (ref 0–200)
Glucose, Bld: 90 mg/dL (ref 70–99)
MCV: 88.3 fL (ref >=78.0)
Platelets: 248 10*3/uL (ref 150–400)
RBC: 4.6 M/uL (ref 3.87–5.11)
Sodium: 138 meq/L (ref 135–145)
Total Protein: 7 g/dL (ref 6.0–8.3)
Triglycerides: 146 mg/dL (ref <150)
WBC: 6.7 10*3/uL (ref 4.0–10.5)

## 2009-06-17 ENCOUNTER — Telehealth (INDEPENDENT_AMBULATORY_CARE_PROVIDER_SITE_OTHER): Payer: Self-pay | Admitting: Internal Medicine

## 2009-07-11 ENCOUNTER — Ambulatory Visit: Payer: Self-pay | Admitting: Internal Medicine

## 2009-07-11 DIAGNOSIS — H60339 Swimmer's ear, unspecified ear: Secondary | ICD-10-CM | POA: Insufficient documentation

## 2009-07-31 ENCOUNTER — Ambulatory Visit: Payer: Self-pay | Admitting: Internal Medicine

## 2009-08-07 IMAGING — CR DG THORACIC SPINE 2V
3 series · 3 of 3 positions shown · non-contrast
Comparison: None

CLINICAL DATA: Acute thoracic back pain.

THORACIC SPINE - 2 VIEW

[w t-spine a.p.]
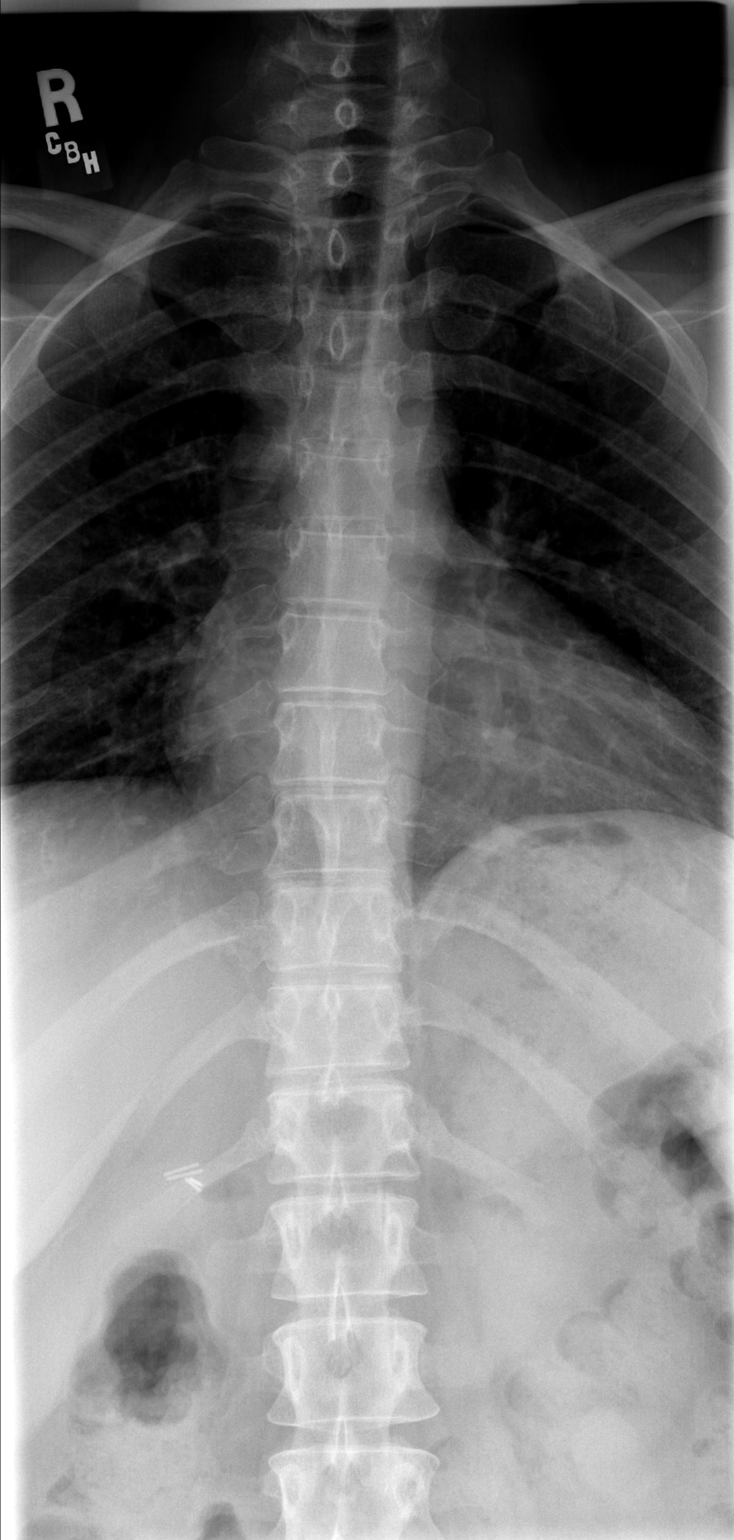

[w t-spine lat]
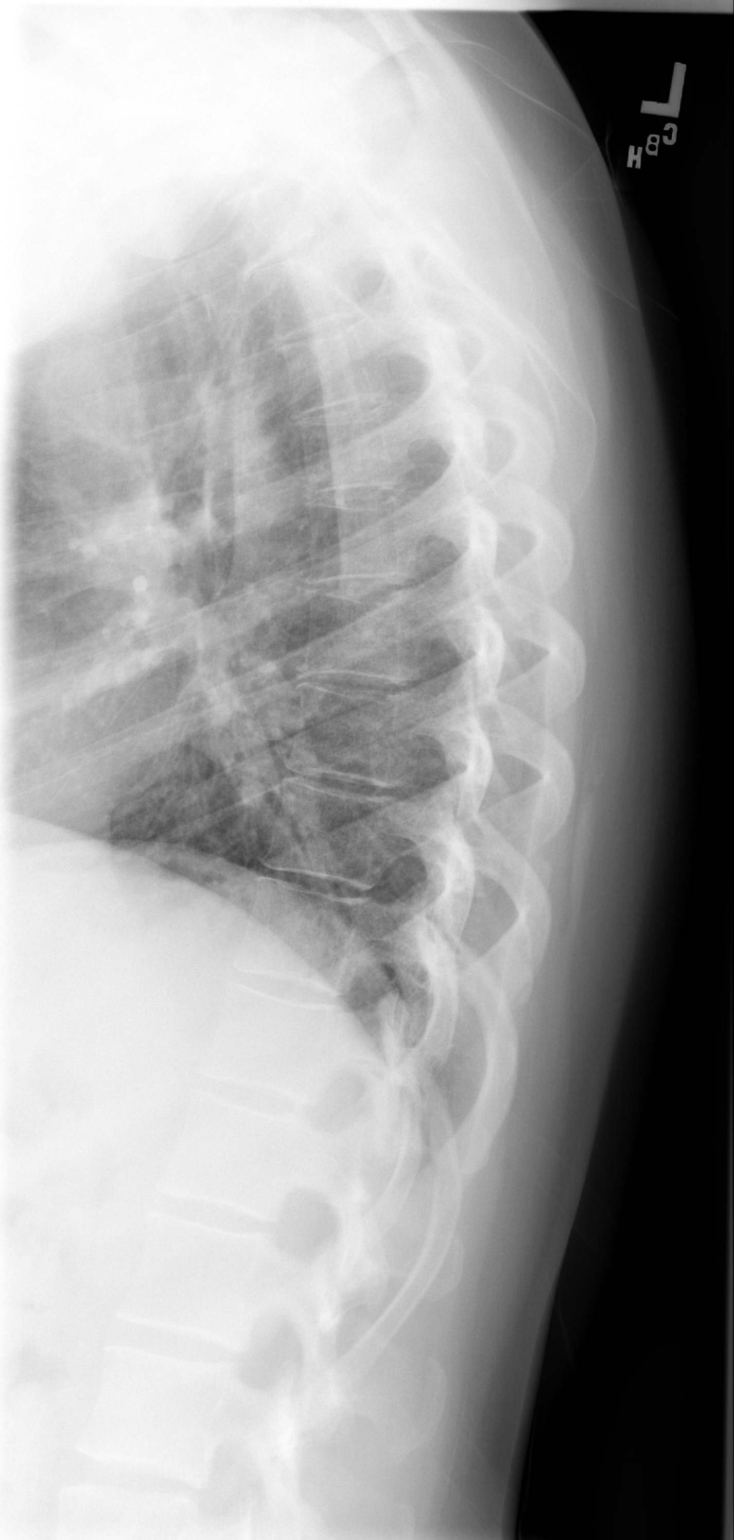

[w swimmers view]
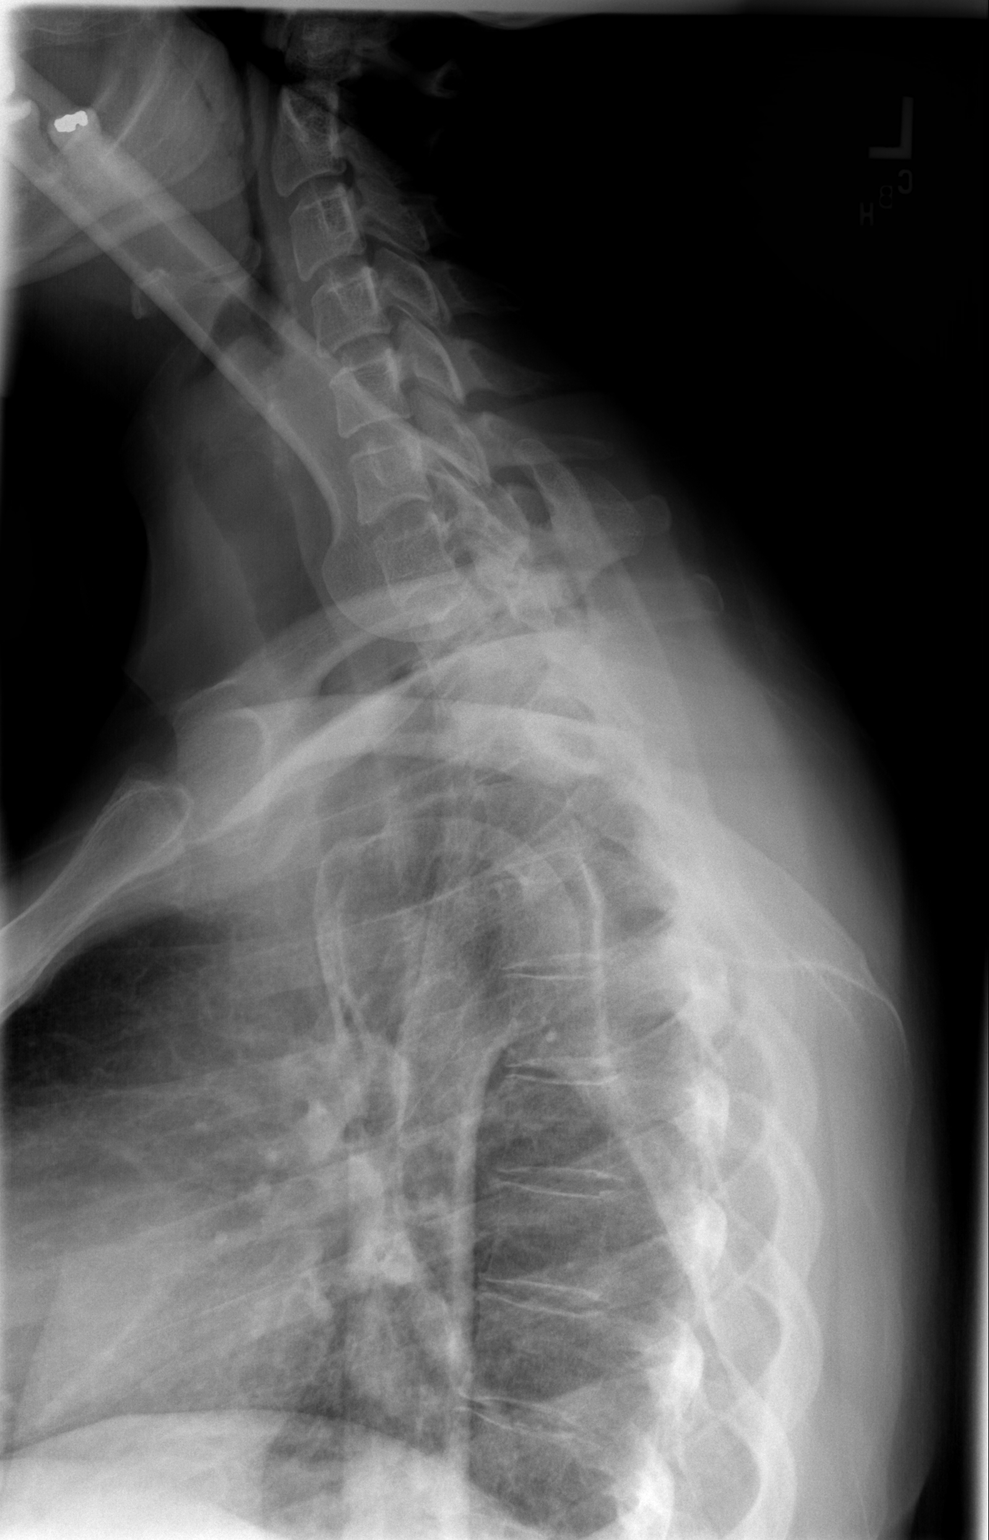

[3 of 3 positions shown; findings below may reference images not displayed]

FINDINGS: There is no evidence of thoracic spine fracture.
Alignment is normal.  No other significant bone abnormalities are
identified.
IMPRESSION: Negative.

## 2009-10-16 ENCOUNTER — Ambulatory Visit: Payer: Self-pay | Admitting: Internal Medicine

## 2009-10-16 ENCOUNTER — Ambulatory Visit (HOSPITAL_COMMUNITY): Admission: RE | Admit: 2009-10-16 | Discharge: 2009-10-16 | Payer: Self-pay | Admitting: Internal Medicine

## 2009-10-16 DIAGNOSIS — M79609 Pain in unspecified limb: Secondary | ICD-10-CM | POA: Insufficient documentation

## 2009-10-16 LAB — CONVERTED CEMR LAB: Cholesterol: 241 mg/dL — ABNORMAL HIGH (ref 0–200)

## 2009-10-17 ENCOUNTER — Telehealth: Payer: Self-pay | Admitting: *Deleted

## 2009-10-20 ENCOUNTER — Telehealth: Payer: Self-pay | Admitting: *Deleted

## 2010-03-20 ENCOUNTER — Telehealth: Payer: Self-pay | Admitting: *Deleted

## 2010-03-25 ENCOUNTER — Ambulatory Visit: Payer: Self-pay | Admitting: Internal Medicine

## 2010-03-25 LAB — CONVERTED CEMR LAB
Albumin: 4.4 g/dL (ref 3.5–5.2)
BUN: 8 mg/dL (ref 6–23)
Basophils Absolute: 0 10*3/uL (ref 0.0–0.1)
CRP: 0.1 mg/dL (ref <0.6)
Calcium: 9.2 mg/dL (ref 8.4–10.5)
Chloride: 104 meq/L (ref 96–112)
Glucose, Bld: 92 mg/dL (ref 70–99)
Hemoglobin: 13.9 g/dL (ref 12.0–15.0)
Lymphocytes Relative: 29 % (ref 12–46)
Monocytes Absolute: 0.8 10*3/uL (ref 0.1–1.0)
Monocytes Relative: 10 % (ref 3–12)
Neutro Abs: 4.5 10*3/uL (ref 1.7–7.7)
Potassium: 3.8 meq/L (ref 3.5–5.3)
RBC: 4.65 M/uL (ref 3.87–5.11)
RDW: 12.6 % (ref 11.5–15.5)
TSH: 0.865 microintl units/mL (ref 0.350–4.5)

## 2010-04-09 ENCOUNTER — Encounter: Admission: RE | Admit: 2010-04-09 | Discharge: 2010-04-09 | Payer: Self-pay | Admitting: Sports Medicine

## 2010-04-28 ENCOUNTER — Encounter: Admission: RE | Admit: 2010-04-28 | Discharge: 2010-06-22 | Payer: Self-pay | Admitting: Sports Medicine

## 2010-04-29 ENCOUNTER — Telehealth: Payer: Self-pay | Admitting: Internal Medicine

## 2010-06-16 ENCOUNTER — Telehealth: Payer: Self-pay | Admitting: Internal Medicine

## 2010-07-23 ENCOUNTER — Encounter: Payer: Self-pay | Admitting: Internal Medicine

## 2010-08-11 ENCOUNTER — Ambulatory Visit (HOSPITAL_COMMUNITY): Admission: RE | Admit: 2010-08-11 | Discharge: 2010-08-11 | Payer: Self-pay | Admitting: Internal Medicine

## 2010-08-11 ENCOUNTER — Telehealth: Payer: Self-pay | Admitting: *Deleted

## 2010-08-11 ENCOUNTER — Ambulatory Visit: Payer: Self-pay | Admitting: Internal Medicine

## 2010-08-11 ENCOUNTER — Telehealth: Payer: Self-pay | Admitting: Ophthalmology

## 2010-08-11 DIAGNOSIS — M26629 Arthralgia of temporomandibular joint, unspecified side: Secondary | ICD-10-CM

## 2010-08-11 LAB — CONVERTED CEMR LAB
Hemoglobin: 14.3 g/dL (ref 12.0–15.0)
RDW: 12.5 % (ref 11.5–15.5)
Sed Rate: 6 mm/hr (ref 0–22)

## 2010-08-12 ENCOUNTER — Telehealth: Payer: Self-pay | Admitting: Internal Medicine

## 2010-08-14 ENCOUNTER — Ambulatory Visit: Payer: Self-pay | Admitting: Internal Medicine

## 2010-08-14 ENCOUNTER — Encounter: Payer: Self-pay | Admitting: Internal Medicine

## 2010-08-20 ENCOUNTER — Encounter: Payer: Self-pay | Admitting: Internal Medicine

## 2010-10-04 ENCOUNTER — Encounter: Payer: Self-pay | Admitting: Gastroenterology

## 2010-10-13 NOTE — Progress Notes (Signed)
Summary: Medication  Phone Note Call from Patient   Caller: Patient Call For: Melida Quitter MD Summary of Call: Call from pt said that she saw Dr. Cathey Endow yesterday.  Given script by doctor was denied by her insurance.  Said that she had pain medication that was prescribed by another physician for her back.  Said that thestrap on her purse broke that she had her medications in and that it had been accidentally thrown away.  Wanted to know if she could get something for her pain if possible.  Said that she does not want the doctor to think that she is trying to pull anything over on anybody.  Can be reached at [phone].Angelina Ok RN  August 12, 2010 9:51 AM      Initial call taken by: Angelina Ok RN,  August 12, 2010 9:51 AM  Follow-up for Phone Call        We will not prescrie narcs if she gets narcs from another MD. She did not mention that she receives narcs from another MD when she saw Dr Cathey Endow. After the first pharmacy denied her RX, she took it to a second pharmacy and said she would pay cash - they were concerned and called [place].   Too many red flags for me to Rx narcs.  Follow-up by: Blanch Media MD,  August 12, 2010 11:34 AM  Additional Follow-up for Phone Call Additional follow up Details #1::        I contacted her regular pharmacy today (11/30) who stated that pt picked up a 30 day supply of vicodin on 11/14 from Dr. Ethelene Hal  #60.  As such we will not give her another perscription today and she will need to discuss the matter with Dr. Ethelene Hal if she feels she needs more narcotics.   Additional Follow-up by: Sinda Du MD,  August 12, 2010 4:25 PM     Appended Document: Medication RTC to pt message left to call the Clinics and to ask to speak with Venita Sheffield.  Calll was to inform pt thta Dr. Cathey Endow would like for her to contact the physician that gave her the pain medication.   He will nol not prescribe her medication for pain at this time. Angelina Ok, RN August 13, 2010 10:30 AM

## 2010-10-13 NOTE — Progress Notes (Signed)
Summary: prior authorization/gp  Phone Note Other Incoming   Summary of Call: Zolpidem 10mg  has been approved for 6 months starting on 10/16/09. [facility] pharmacy made awared. Initial call taken by: Chinita Pester RN,  October 20, 2009 9:43 AM

## 2010-10-13 NOTE — Assessment & Plan Note (Signed)
Summary: REASSIGNED NEW TO DR/MEDICATION F/U PER DR KILMA/CFB   Vital Signs:  Patient profile:   40 year old female Height:      60 inches (152.40 cm) Weight:      156.5 pounds (71.14 kg) BMI:     30.67 Temp:     97.9 degrees F (36.61 degrees C) oral Pulse rate:   82 / minute BP sitting:   102 / 63  (right arm)  Vitals Entered By: Chinita Pester RN (October 16, 2009 10:30 AM) CC: New to MD; BLE's  aching esp. in the a.m.(wakes her up).Refill on Ambien.  Hands are very dry/red/crackling. Is Patient Diabetic? No Pain Assessment Patient in pain? yes     Location: BLE's Intensity: 6 Type: aching Onset of pain  Intermittent Nutritional Status BMI of > 30 = obese  Have you ever been in a relationship where you felt threatened, hurt or afraid?No   Does patient need assistance? Functional Status Self care Ambulation Normal   Primary Care Provider:  Nilda Riggs MD  CC:  New to MD; BLE's  aching esp. in the a.m.(wakes her up).Refill on Ambien.  Hands are very dry/red/crackling.Marland Kitchen  History of Present Illness: Pt is a 40 yr old woman with PMH of depression, right ear infection and smoking abuse came here for f/u for:  1) Medication refills - Ambien   2) Bilateral leg pain- Began around 08/2009, when she was working at a seasonal [employer], where she was on her feet all day. Now the pain is slightly different, pain begins below the knee and radiates down to feet. The pain is greater in the left leg. Pain awakes her at night. No history of trauma or fall. When pain gets severe, pt has to get up and walk. Aggravated by prolonged periods of rest. Pt reports she takes Ibuprofen about 4 to relieve pain.     Depression History:      The patient denies a depressed mood most of the day and a diminished interest in her usual daily activities.         Preventive Screening-Counseling & Management  Alcohol-Tobacco     Alcohol drinks/day: 0     Smoking Status: current  Smoking Cessation Counseling: yes     Smoke Cessation Stage: trying     Packs/Day: 0.25     Year Started: one year ago  Caffeine-Diet-Exercise     Does Patient Exercise: yes     Type of exercise: WALKING     Times/week: ATTIMES  Current Medications (verified): 1)  Ambien 10 Mg Tabs (Zolpidem Tartrate) .... Take 1 Tablet By Mouth At Bedtime As Needed  Allergies (verified): 1)  ! Penicillin 2)  ! Zithromax  Past History:  Past Medical History: Last updated: 10/12/2006 Anxiety Depression Hyperlipidemia  Past Surgical History: Last updated: 10/12/2006  1. Left gastroc slide.  2. Left plantar fascial toe pad fascial tenotomy.  Family History: Last updated: 01/02/2009 No significant medical family history.   Social History: Last updated: 07/31/2009 Current Smoker: 1/2 PPD Alcohol use-no  Risk Factors: Alcohol Use: 0 (10/16/2009) Exercise: yes (10/16/2009)  Risk Factors: Smoking Status: current (10/16/2009) Packs/Day: 0.25 (10/16/2009)  Review of Systems General:  Denies chills, fatigue, fever, loss of appetite, sweats, weakness, and weight loss. Eyes:  Denies blurring. ENT:  Denies earache, hoarseness, nasal congestion, sinus pressure, and sore throat. CV:  Denies chest pain or discomfort, difficulty breathing at night, difficulty breathing while lying down, and swelling of feet. Resp:  Denies cough and sputum productive. GI:  Denies abdominal pain, change in bowel habits, constipation, diarrhea, hemorrhoids, indigestion, nausea, and vomiting. GU:  Denies dysuria, urinary frequency, and urinary hesitancy. MS:  Complains of joint pain, loss of strength, and mid back pain.  Physical Exam  General:  alert and well-developed.   Head:  normocephalic and atraumatic.   Eyes:  vision grossly intact.   Ears:  R ear normal and L ear normal.   Neck:  supple, full ROM, and no masses.   Lungs:  normal respiratory effort, no intercostal retractions, no accessory muscle  use, normal breath sounds, no dullness, no fremitus, no crackles, and no wheezes.   Heart:  normal rate, regular rhythm, no murmur, and no gallop.   Abdomen:  soft, non-tender, normal bowel sounds, no distention, no masses, and no guarding.   Msk:  normal ROM and joint tenderness.   Extremities:  no edema noted   Impression & Recommendations:  Problem # 1:  LEG PAIN, BILATERAL (ICD-729.5) Assessment New Pain is described more in knee joints. On physical exam there was no gait abnormality, and there was full ROM. Pain is more likely related to strain with overuse. Will check xrays of both knees and ankles to r/o acute fracture and degenerative changes. Pt reports using 4 or more Ibuprofen to relieve pain, without success. Will prescribe pt Tramadol for better pain management and Voltaren gel to apply topically over affected area. Also recommended PT.   Orders: Physical Therapy Referral (PT) Diagnostic X-Ray/Fluoroscopy (Diagnostic X-Ray/Flu)  Problem # 2:  INSOMNIA (ICD-780.52) Assessment: Comment Only Pt continues to experience insomnia.  Plan: Refill Ambien   Her updated medication list for this problem includes:    Ambien 10 Mg Tabs (Zolpidem tartrate) .Marland Kitchen... Take 1 tablet by mouth at bedtime as needed  Problem # 3:  Preventive Health Care (ICD-V70.0) Pt is up to date with immunizations and screening tests. Will check lipid panel, as prior  LDL was on the upper limit of normal.    Complete Medication List: 1)  Ambien 10 Mg Tabs (Zolpidem tartrate) .... Take 1 tablet by mouth at bedtime as needed 2)  Tramadol Hcl 50 Mg Tabs (Tramadol hcl) .... Take 1 tab every 6 hours or at bedtime for pain, do not exceed 3 tabs daily. 3)  Voltaren 1 % Gel (Diclofenac sodium) .... Apply a small amount to knees once daily.  Other Orders: T-Lipid Profile ([id number])  Patient Instructions: 1)  Please schedule a follow-up appointment in 1 month. 2)  Please follow up with physical therapy as  scheduled. 3)  Stop Smoking Tips: Choose a Quit date. Cut down before the Quit date. decide what you will do as a substitute when you feel the urge to smoke(gum,toothpick,exercise). Prescriptions: VOLTAREN 1 % GEL (DICLOFENAC SODIUM) Apply a small amount to knees once daily.  #1 tube x 2   Entered and Authorized by:   Melida Quitter MD   Signed by:   Melida Quitter MD on 10/16/2009   Method used:   Print then Give to Patient   RxID:   [id number] TRAMADOL HCL 50 MG TABS (TRAMADOL HCL) Take 1 tab every 6 hours or at bedtime for pain, do not exceed 3 tabs daily.  #60 x 2   Entered and Authorized by:   Melida Quitter MD   Signed by:   Melida Quitter MD on 10/16/2009   Method used:   Print then Give to Patient   RxID:   [id number] AMBIEN 10  MG TABS (ZOLPIDEM TARTRATE) Take 1 tablet by mouth at bedtime as needed  #30 x 2   Entered and Authorized by:   Melida Quitter MD   Signed by:   Melida Quitter MD on 10/16/2009   Method used:   Print then Give to Patient   RxID:   [id number]   Prevention & Chronic Care Immunizations   Influenza vaccine: Fluvax 3+  (07/11/2009)    Tetanus booster: Not documented    Pneumococcal vaccine: Not documented  Other Screening   Pap smear: NEGATIVE FOR INTRAEPITHELIAL LESIONS OR MALIGNANCY.  (12/05/2008)   Smoking status: current  (10/16/2009)   Smoking cessation counseling: yes  (10/16/2009)  Lipids   Total Cholesterol: 219  (05/22/2009)   Lipid panel action/deferral: Lipid Panel ordered   LDL: 140  (05/22/2009)   LDL Direct: Not documented   HDL: 50  (05/22/2009)   Triglycerides: 146  (05/22/2009)  Process Orders Check Orders Results:     [facility]: ABN not required for this insurance Tests Sent for requisitioning (October 17, 2009 8:16 AM):     10/16/2009: [facility] -- T-Lipid Profile [phone] (signed)    Process Orders Check Orders Results:     [facility]: ABN not  required for this insurance Tests Sent for requisitioning (October 17, 2009 8:16 AM):     10/16/2009: [facility] -- T-Lipid Profile [phone] (signed)

## 2010-10-13 NOTE — Progress Notes (Signed)
Summary: pharmacy call/ hla  Phone Note From Pharmacy   Summary of Call: fleming, [facility] to say [facility] to fill vicodin and naproxen, she stated she wanted to pay cash for the vicodin. fleming states this [facility]'s [facility] and pt has been getting vicodin from dr Ethelene Hal at [facility] and taking those scripts to another pharm, fleming states that pt recently did that and she is informing int med. she is instructed to hold script until the attending is informed. i spoke w/ dr Midwife. Initial call taken by: Marin Roberts RN,  August 11, 2010 2:32 PM  Follow-up for Phone Call        Printed off [facility] narc databse. Myriam Jacobson will tell pharmac not to fill today's [facility] narc Rx. I entered note into directives. Follow-up by: Blanch Media MD,  August 11, 2010 2:39 PM

## 2010-10-13 NOTE — Progress Notes (Signed)
Summary: refill/gg  Phone Note Refill Request  on April 29, 2010 12:36 PM  Refills Requested: Medication #1:  AMBIEN 10 MG TABS Take 1 tablet by mouth at bedtime as needed   Last Refilled: 03/24/2010  Method Requested: Fax to Local Pharmacy Initial call taken by: Merrie Roof RN,  April 29, 2010 12:36 PM  Follow-up for Phone Call        Refill approved-nurse to complete Follow-up by: Melida Quitter MD,  April 29, 2010 1:50 PM  Additional Follow-up for Phone Call Additional follow up Details #1::        Rx faxed to pharmacy Additional Follow-up by: Merrie Roof RN,  April 29, 2010 2:37 PM    Prescriptions: AMBIEN 10 MG TABS (ZOLPIDEM TARTRATE) Take 1 tablet by mouth at bedtime as needed  #30 x 0   Entered and Authorized by:   Melida Quitter MD   Signed by:   Melida Quitter MD on 04/29/2010   Method used:   Telephoned to ...       [facility] [address] [phone]* (retail)       [address]       [place], [place]  [address]       Ph: [phone] or [phone]       Fax: [phone]   RxID:   [id number]

## 2010-10-13 NOTE — Assessment & Plan Note (Signed)
Summary: ear pain/gg   Vital Signs:  Patient profile:   40 year old female Height:      60 inches (152.40 cm) Weight:      157.3 pounds (70.05 kg) BMI:     30.20 Temp:     97.9 degrees F (36.61 degrees C) oral Pulse rate:   87 / minute BP sitting:   123 / 79  (right arm) Cuff size:   regular  Vitals Entered By: Theotis Barrio NT II (August 11, 2010 10:33 AM) CC: PATIENT IS HERE FOR LEFT JAW AND EAR PAIN FOR ABOUT 3 DAYS, Is Patient Diabetic? No Pain Assessment Patient in pain? yes     Location: LEFT JAW/EAR Intensity:          6 Type: DULL ACHE Onset of pain  ABOUT 3 DAYS AGO Nutritional Status BMI of 25 - 29 = overweight  Have you ever been in a relationship where you felt threatened, hurt or afraid?No   Does patient need assistance? Functional Status Self care Ambulation Normal   Primary Care Provider:  Nilda Riggs MD  CC:  PATIENT IS HERE FOR LEFT JAW AND EAR PAIN FOR ABOUT 3 DAYS and .  History of Present Illness: This is a 40 year old with hx of GERD, esophageal stricture, and anxiety who presents for an acute visit for a 3 day hx of left  jaw pain radiating to the ear.  Pt states that she awoke 3 days ago with pain in her jaw and states that it felt like someone "punched her".  Throughout the day, the pain worsened and was associated with closing her jaw or chewing.   Pain is constant and discribed as 6/10 in intensity.  Pt has tried taking ibuprofen without relief.  Pt states that the pain radiates to the ear and causes her to have a headache.  Pt last saw a dentist in November and had no problems at that time but pt had multiple extractions of her molars in the past due to dental caries.  Pt denies any rash, fever, chills, but has had some mild facial swelling.  Pt also denis any weight change, change in her BMs or change is vision.   Pt continues to have mild dysphagia secondary to her esophageal stricture but is not concerned about that at this point in time. Pt  also denies any GERD symptoms and states that she is primarily controling her symptoms by avoidance of triggers.    Depression History:      The patient denies a depressed mood most of the day and a diminished interest in her usual daily activities.         Preventive Screening-Counseling & Management  Alcohol-Tobacco     Alcohol drinks/day: 0     Smoking Status: current     Smoking Cessation Counseling: yes     Smoke Cessation Stage: trying     Packs/Day: 0.25     Year Started: one year ago  Caffeine-Diet-Exercise     Does Patient Exercise: yes     Type of exercise: WALKING     Times/week: ATTIMES  Allergies: 1)  ! Penicillin 2)  ! Zithromax  Past History:  Past Medical History: Last updated: 10/12/2006 Anxiety Depression Hyperlipidemia  Family History: Reviewed history from 01/02/2009 and no changes required. No significant medical family history.   Social History: Reviewed history from 07/31/2009 and no changes required. Current Smoker: 1/2 PPD Alcohol use-no  Review of Systems  Negative as per HPI.   Physical Exam  General:  alert and well-developed.   Head:  normocephalic and atraumatic.  There is tenderness to palpation over the TMJ which appears to track normally throughout jaw opening and closing.  Eyes:  vision grossly intact, pupils equal, pupils round, and pupils reactive to light.   Ears:  R ear normal and L ear normal.  TMs wnl bilaterally Nose:  no external deformity and no nasal discharge.  Mild asymetry of the  nasolabial fold with mild edema on the left.  Mouth:  pharynx pink and moist.   Neck:  supple.   Lungs:  normal respiratory effort, normal breath sounds, no crackles, and no wheezes.   Heart:  normal rate, regular rhythm, no murmur, no gallop, and no rub.   Abdomen:  soft, non-tender, normal bowel sounds, no distention, and no masses.   Neurologic:  cranial nerves II-XII intact.  cranial nerves II-XII intact.   Skin:  No Rashes.     Impression & Recommendations:  Problem # 1:  TMJ PAIN (ICD-524.62) DDX would include TMJ pain vs. trigeminal neuralgia vs parotiditis vs zoster with the first being the most likely given the presentation and physical exam.  Although the jaw does not appear dislocated and the pt denies any trauma, I will check a orthopanogram today.  Given the acute onset and severity of the pain I will check a CBC and sed rate today though an infectious or autoimmune etiology is also less likely.  For now I will treat conservatively with pain control (vicodin for the short term) and naproxen to reduce inflamation.  The patient has received instructions that the patient should eat a soft diet and use warm compresses.  Pt was initially given a prescription for vicodin, however, she recently filled vicodin prescribed by Dr. Ethelene Hal, as such, the pharmacy was instructed not to refill the script. The patient will return in 1 week unless her pain resolves.  At that point, TMJ injection vs referral to a maxillofacial surgeon should be considered.     Orders: T-CBC No Diff ([id number]) T-Sed Rate (Automated) ([id number]) Radiology other (Radiology Other)  Problem # 2:  GERD (ICD-530.81) Pt currently denies any symptoms of GERD.  Will continue to monitor.  Her updated medication list for this problem includes:    Omeprazole 20 Mg Cpdr (Omeprazole) .Marland Kitchen... Take 1 tablet by mouth once a day for stomach protection while taking meloxicam  Problem # 3:  Preventive Health Care (ICD-V70.0) Pt did recieve a flu vaccination today.   Orders: Flu Vaccine 53yrs + MEDICARE PATIENTS (X5284)  Complete Medication List: 1)  Ambien 10 Mg Tabs (Zolpidem tartrate) .... Take 1 tablet by mouth at bedtime as needed 2)  Omeprazole 20 Mg Cpdr (Omeprazole) .... Take 1 tablet by mouth once a day for stomach protection while taking meloxicam 3)  Meloxicam 7.5 Mg Tabs (Meloxicam) .... Take 1 tablet by mouth once a day for leg pain. 4)   Vicodin 5-500 Mg Tabs (Hydrocodone-acetaminophen) .... Take 1-2 tabs every 6 hours as needed for pain. 5)  Naproxen 500 Mg Tabs (Naproxen) .... Take 1 tablet by mouth two times a day x 7 days. take with food.  Patient Instructions: 1)  Please make an appointment for this Friday.  You may cancel if you are better. I am prescribing you a pain medication and an anti-inflamatory drug for one week, these should be taken with food.  You may use a warm compress on your jaw. Prescriptions:  NAPROXEN 500 MG TABS (NAPROXEN) Take 1 tablet by mouth two times a day x 7 days. Take with food.  #14 x 0   Entered and Authorized by:   Sinda Du MD   Signed by:   Sinda Du MD on 08/11/2010   Method used:   Print then Give to Patient   RxID:   [id number] VICODIN 5-500 MG TABS (HYDROCODONE-ACETAMINOPHEN) Take 1-2 tabs every 6 hours as needed for pain.  #42 x 0   Entered and Authorized by:   Sinda Du MD   Signed by:   Sinda Du MD on 08/11/2010   Method used:   Print then Give to Patient   RxID:   [id number]    Orders Added: 1)  T-CBC No Diff [[id number]] 2)  T-Sed Rate (Automated) [[id number]] 3)  Est. Patient Level III [43329] 4)  Flu Vaccine 65yrs + MEDICARE PATIENTS [Q2039] 5)  Radiology other [Radiology Other]   Process Orders Check Orders Results:     [facility]: ABN not required for this insurance Tests Sent for requisitioning (August 11, 2010 9:41 PM):     08/11/2010: [facility] -- T-CBC No Diff [[id number]] (signed)     08/11/2010: [facility] -- T-Sed Rate (Automated) [[id number]] (signed)     Prevention & Chronic Care Immunizations   Influenza vaccine: Fluvax 3+  (07/11/2009)   Influenza vaccine deferral: Not available  (03/25/2010)    Tetanus booster: Not documented   Td booster deferral: Deferred  (03/25/2010)    Pneumococcal vaccine: Not documented   Pneumococcal vaccine deferral: Not indicated   (03/25/2010)  Other Screening   Pap smear: NEGATIVE FOR INTRAEPITHELIAL LESIONS OR MALIGNANCY.  (12/05/2008)   Pap smear action/deferral: Deferred-2 yr interval  (03/25/2010)   Smoking status: current  (08/11/2010)   Smoking cessation counseling: yes  (08/11/2010)  Lipids   Total Cholesterol: 241  (10/16/2009)   Lipid panel action/deferral: Lipid Panel ordered   LDL: 177  (10/16/2009)   LDL Direct: Not documented   HDL: 51  (10/16/2009)   Triglycerides: 67  (10/16/2009)

## 2010-10-13 NOTE — Medication Information (Signed)
Summary: RX HISTORY REPORT  RX HISTORY REPORT   Imported By: Margie Billet 08/18/2010 11:16:20  _____________________________________________________________________  External Attachment:    Type:   Image     Comment:   External Document

## 2010-10-13 NOTE — Progress Notes (Signed)
Summary: phone/gg  Phone Note Call from Patient   Caller: Patient Summary of Call: Pt c/o pain to left side of face,  pain to jaw with rad to ear.  Can't close jaw or chew.  Onset 3 days ago. She has taken IBU without relief.  Will see this AM Initial call taken by: Merrie Roof RN,  August 11, 2010 9:33 AM

## 2010-10-13 NOTE — Assessment & Plan Note (Signed)
Summary: EST-F/U VISIT/CH   Vital Signs:  Patient profile:   40 year old female Height:      60 inches (152.40 cm) Weight:      157.9 pounds (71.77 kg) BMI:     30.95 Temp:     98.5 degrees F (36.94 degrees C) oral Pulse rate:   85 / minute BP sitting:   101 / 68  (left arm) Cuff size:   large  Vitals Entered By: Cynda Familia Duncan Dull) (August 14, 2010 11:10 AM) CC: 3day f/u left side jaw side, ambien refill Is Patient Diabetic? No Pain Assessment Patient in pain? yes     Location: jaw Intensity: 5 Type: sharp Onset of pain  Sudden 3 days ago Nutritional Status BMI of > 30 = obese  Have you ever been in a relationship where you felt threatened, hurt or afraid?No   Does patient need assistance? Functional Status Self care Ambulation Normal   Primary Care Provider:  Nilda Riggs MD  CC:  3day f/u left side jaw side and ambien refill.  History of Present Illness: This is a 40 year old with hx of GERD, esophageal stricture, and anxiety who presents for an acute visit for a 3 day hx of left  jaw pain radiating to the ear.  Pt states that she awoke 5 days ago with pain in her jaw and states that it felt like someone "punched her".  Throughout the day, the pain worsened and was associated with closing her jaw or chewing.   Pain is constant and discribed as 6/10 in intensity.  Pt has tried taking ibuprofen without relief.  Pt states that the pain radiates to the ear and causes her to have a headache.  Pt last saw a dentist in November and had no problems at that time but pt had multiple extractions of her molars in the past due to dental caries.  Pt denies any rash, fever, chills, but has had some mild facial swelling.  Pt also denis any weight change, change in her BMs or change is vision.  She was evaluated for this 11/29, where she had orthopantogram, CBC and sed rate checked, all of which were normal. She was given a prescription for Vicodin and Naproxen, however could not  fill the vicodin as she is also receiving this medication from Dr. Ethelene Hal (ortho). She returns to clinic today to have this medication issue resolved. She still complains of jaw pain, unchanged from prior visit.       Depression History:      The patient denies a depressed mood most of the day and a diminished interest in her usual daily activities.         Preventive Screening-Counseling & Management  Alcohol-Tobacco     Alcohol drinks/day: 0     Smoking Status: current     Smoking Cessation Counseling: yes     Smoke Cessation Stage: trying     Packs/Day: 0.25     Year Started: one year ago  Current Medications (verified): 1)  Ambien 10 Mg Tabs (Zolpidem Tartrate) .... Take 1 Tablet By Mouth At Bedtime As Needed 2)  Omeprazole 20 Mg Cpdr (Omeprazole) .... Take 1 Tablet By Mouth Once A Day For Stomach Protection While Taking Meloxicam 3)  Vicodin 5-500 Mg Tabs (Hydrocodone-Acetaminophen) .... Take 1-2 Tabs Every 6 Hours As Needed For Pain. 4)  Naproxen 500 Mg Tabs (Naproxen) .... Take 1 Tablet By Mouth Two Times A Day X 7 Days. Take With Food.  Allergies: 1)  ! Penicillin 2)  ! Zithromax  Past History:  Past Medical History: Last updated: 10/12/2006 Anxiety Depression Hyperlipidemia  Past Surgical History: Last updated: 10/12/2006  1. Left gastroc slide.  2. Left plantar fascial toe pad fascial tenotomy.  Family History: Last updated: 01/02/2009 No significant medical family history.   Social History: Last updated: 07/31/2009 Current Smoker: 1/2 PPD Alcohol use-no  Risk Factors: Alcohol Use: 0 (08/14/2010) Exercise: yes (08/11/2010)  Risk Factors: Smoking Status: current (08/14/2010) Packs/Day: 0.25 (08/14/2010)  Review of Systems      See HPI  Physical Exam  General:  alert and well-developed.  VS reviewed  Head:  normocephalic, atraumatic, no abnormalities observed, and no abnormalities palpated.   Ears:  R ear normal and L ear normal.   TMs normal   Mouth:  no gingival abnormalities and pharynx pink and moist.   Lungs:  normal respiratory effort and normal breath sounds.   Heart:  normal rate and regular rhythm.     Impression & Recommendations:  Problem # 1:  TMJ PAIN (ICD-524.62) Assessment Improved TMJ pain has somewhat improved with naproxen. The exact etiology is still unknown, however orthopantogram, CBC and sed rate were all normal, making autoimmune, and infectious causes less likely. She is concerned that she could not get her Vicodin refilled. In looking at the [facility] narcotic data base, it appears that she received 90 Vicodin tablets from Dr. Ethelene Hal and Dr. Penni Bombard from the orthopedic clinic in the month of November. According to the patient, her boyfriend had threw out the second bottle by accident. I explained to the patient that she cannot fill multiple scripts for vicodin from different providers. I advised her to continue to follow with the Ortho clinic to receive her steroid injections and pain meds. Will give patient toradol shot today, and refill Naproxen. Pt was agreeable to this plan.   Complete Medication List: 1)  Ambien 10 Mg Tabs (Zolpidem tartrate) .... Take 1 tablet by mouth at bedtime as needed 2)  Omeprazole 20 Mg Cpdr (Omeprazole) .... Take 1 tablet by mouth once a day for stomach protection while taking meloxicam 3)  Vicodin 5-500 Mg Tabs (Hydrocodone-acetaminophen) .... Take 1-2 tabs every 6 hours as needed for pain. 4)  Naproxen 500 Mg Tabs (Naproxen) .... Take 1 tablet by mouth two times a day x 7 days. take with food.  Patient Instructions: 1)  Please follow up with Dr. Ethelene Hal for lower back pain. 2)  Please take Naproxen as needed for pain.  Prescriptions: AMBIEN 10 MG TABS (ZOLPIDEM TARTRATE) Take 1 tablet by mouth at bedtime as needed  #30 x 0   Entered by:   Cynda Familia (Duncan Dull)   Authorized by:   Melida Quitter MD   Signed by:   Melida Quitter MD on 08/14/2010   Method used:   Telephoned  to ...       [facility] [address] [phone]* (retail)       [address]       [address]       [place], [place]  [address]       Ph: [phone]       Fax: [phone]   RxID:   [id number] NAPROXEN 500 MG TABS (NAPROXEN) Take 1 tablet by mouth two times a day x 7 days. Take with food.  #28 x 0   Entered and Authorized by:   Melida Quitter MD   Signed by:   Melida Quitter MD on 08/14/2010  Method used:   Print then Give to Patient   RxID:   [id number]  Rx for Ambien called into pt's new pharmacy-[facility] on [address] .Cynda Familia [facility])  August 14, 2010 12:06 PM    Medication Administration  Injection # 1:    Medication: Ketorolac-Toradol 15mg     Diagnosis: TMJ PAIN (ICD-524.62)    Route: IM    Comments: Hd pt wait 10-15 min after injection to make sure she coule tolerate.. Pt given a total of 30mg .Cynda Familia ([facility])  August 14, 2010 12:07 PM     Patient tolerated injection without complications    Given by: Cynda Familia Duncan Dull) (August 14, 2010 12:08 PM)  Orders Added: 1)  Est. Patient Level III [99213]     Prevention & Chronic Care Immunizations   Influenza vaccine: Fluvax 3+  (07/11/2009)   Influenza vaccine deferral: Not available  (03/25/2010)    Tetanus booster: Not documented   Td booster deferral: Deferred  (03/25/2010)    Pneumococcal vaccine: Not documented   Pneumococcal vaccine deferral: Not indicated  (03/25/2010)  Other Screening   Pap smear: NEGATIVE FOR INTRAEPITHELIAL LESIONS OR MALIGNANCY.  (12/05/2008)   Pap smear action/deferral: Deferred-2 yr interval  (03/25/2010)   Smoking status: current  (08/14/2010)   Smoking cessation counseling: yes  (08/14/2010)  Lipids   Total Cholesterol: 241  (10/16/2009)   Lipid panel action/deferral: Lipid Panel ordered   LDL: 177  (10/16/2009)   LDL Direct: Not documented   HDL: 51  (10/16/2009)   Triglycerides: 67  (10/16/2009)

## 2010-10-13 NOTE — Consult Note (Signed)
Summary: [facility]  [facility]   Imported By: Louretta Parma 07/31/2010 16:07:31  _____________________________________________________________________  External Attachment:    Type:   Image     Comment:   External Document

## 2010-10-13 NOTE — Progress Notes (Signed)
Summary: refill/gg  Phone Note Refill Request  on June 16, 2010 10:29 AM  Refills Requested: Medication #1:  AMBIEN 10 MG TABS Take 1 tablet by mouth at bedtime as needed   Last Refilled: 06/02/2010  Method Requested: Fax to Local Pharmacy Initial call taken by: Merrie Roof RN,  June 16, 2010 10:30 AM  Follow-up for Phone Call        Refill approved-nurse to complete Follow-up by: Melida Quitter MD,  June 16, 2010 2:15 PM  Additional Follow-up for Phone Call Additional follow up Details #1::        Rx faxed to pharmacy Additional Follow-up by: Merrie Roof RN,  June 18, 2010 12:21 PM    Prescriptions: AMBIEN 10 MG TABS (ZOLPIDEM TARTRATE) Take 1 tablet by mouth at bedtime as needed  #30 x 0   Entered and Authorized by:   Melida Quitter MD   Signed by:   Melida Quitter MD on 06/16/2010   Method used:   Telephoned to ...       [facility] [address] [phone]* (retail)       [address]       [place], [place]  [address]       Ph: [phone] or [phone]       Fax: [phone]   RxID:   [id number]

## 2010-10-13 NOTE — Assessment & Plan Note (Signed)
Summary: leg pain/gg   Vital Signs:  Patient profile:   40 year old female Height:      60 inches (152.40 cm) Weight:      154.1 pounds (70.05 kg) BMI:     30.20 Temp:     99.5 degrees F (37.50 degrees C) oral Pulse rate:   99 / minute BP sitting:   117 / 73  (right arm) Cuff size:   regular  Vitals Entered By: Cynda Familia Duncan Dull) (March 25, 2010 1:46 PM) CC: f/u left leg pain, achy pain  that wakes her up at night, neg ankle/knee films in 10/2009 Is Patient Diabetic? No Pain Assessment Patient in pain? yes     Location: left leg Intensity: 4 Type: aching Onset of pain  Intermittent since 09/2009 Nutritional Status BMI of 25 - 29 = overweight  Have you ever been in a relationship where you felt threatened, hurt or afraid?No   Does patient need assistance? Functional Status Self care Ambulation Normal   Primary Care Provider:  Nilda Riggs MD  CC:  f/u left leg pain, achy pain  that wakes her up at night, and neg ankle/knee films in 10/2009.  History of Present Illness: Patient is a 40 year old female who presents today because of left leg pain.  She was seen in Febuary 2011 for bilateral leg pain and was started on Tramadol and Voltaren.  This helped for a few months but over the last month her left leg has started hurting again.  Her right leg bothers her occasionally but her left leg wakes her up almost every night and bothers her ever day.  The pain is in the distal anterior part of her thigh and down her leg to her toes.  She denies any numbness, weakness, or trouble walking.  She also denies any joint swelling, red warm joints, or diffuse stiffness.  The tramadol doesn't seem to be helping anymore.  She was refered to PT in February but never went.  Depression History:      The patient denies a depressed mood most of the day and a diminished interest in her usual daily activities.        The patient denies that she feels like life is not worth living, denies that she  wishes that she were dead, and denies that she has thought about ending her life.         Preventive Screening-Counseling & Management  Alcohol-Tobacco     Alcohol drinks/day: 0     Smoking Status: current     Smoking Cessation Counseling: yes     Smoke Cessation Stage: trying     Packs/Day: 0.25     Year Started: one year ago  Current Medications (verified): 1)  Ambien 10 Mg Tabs (Zolpidem Tartrate) .... Take 1 Tablet By Mouth At Bedtime As Needed 2)  Tramadol Hcl 50 Mg Tabs (Tramadol Hcl) .... Take 1 Tab Every 6 Hours or At Bedtime For Pain, Do Not Exceed 3 Tabs Daily. 3)  Voltaren 1 % Gel (Diclofenac Sodium) .... Apply A Small Amount To Knees Once Daily.  Allergies: 1)  ! Penicillin 2)  ! Zithromax  Past History:  Past medical, surgical, family and social histories (including risk factors) reviewed for relevance to current acute and chronic problems.  Past Medical History: Reviewed history from 10/12/2006 and no changes required. Anxiety Depression Hyperlipidemia  Past Surgical History: Reviewed history from 10/12/2006 and no changes required.  1. Left gastroc slide.  2. Left  plantar fascial toe pad fascial tenotomy.  Family History: Reviewed history from 01/02/2009 and no changes required. No significant medical family history.   Social History: Reviewed history from 07/31/2009 and no changes required. Current Smoker: 1/2 PPD Alcohol use-no  Review of Systems      See HPI  Physical Exam  Additional Exam:  General: Well developed, female in no acute distress  Head: Atraumatic, normocephalic with no signs of trauma  Eyes: PERRLA, EOM intact  Ears: TM intact  Nose: Nares patent, mucosa is pink and moist, no polyps noted  Mouth: Mucosa is pink and moist, dention,   Neck: Supple, full ROM, no thyromegaly or masses noted  Resp: Clear to ascultation bilaterally, no wheezes, rales, or rhonchi noted  CV: Regular rate and rhythm with no murmurs, rubs, or gallops  noted  Abdomen: Soft, non-tender, non-distended with normal bowel sounds  Musculoskelatal: ROM full with intact strength, no pain to palpation. Back extension, flexion, rotation, and bending are normal ROM with no pain to palpation over the spine or paraspinus muscles.  Hip, Knee, ankle, and foot ROM is normal, strength is 5/5 and symmetric.  There was minor pain to external rotation of the left hip.  There is mild pain to palpation of the medial and lateral collateral ligaments of the knee. Neurologic: Alert and oriented x3, Cranial nerves II-XII grossly intact, DTR normal and symmetric.  Sensory exam of the lower extremity is normal to light touch as well as pinprick. Pulses: Radial, brachial, carotid, femoral, dorsal pedis, and posterior tibial pulses equal and symmetric      Impression & Recommendations:  Problem # 1:  LEG PAIN, BILATERAL (ICD-729.5) This is returning and seems to be centered around the knee again.  We will discontinue the tramadol and Voltarin since it isn't helping and try Meloxicam 7.5mg  daily.  We will also add Omeprazole for GI protection.  We will also recheck some lab work to check for any more concerning causes of the pain since the symptoms and history don't fully fit with an orthopedic injury.  I will also refer her to sports medicine to see if they have any suggestions for her to deal with the knee pain.  Laboratory testing results were reviewed and were all normal except for a slightly increased ESR at 29 (normal is 22).  I'm not sure of the significance of this result but we will discuss this at her follow up appointment.  Orders: Sports Medicine (Sports Med) T-CMP with Estimated GFR ([id number]) T-CBC w/Diff [phone]) T-TSH [phone]) T-Sed Rate (Automated) [phone]) T-C-Reactive Protein [phone]) T-CK Total [phone])  Complete Medication List: 1)  Ambien 10 Mg Tabs (Zolpidem tartrate) .... Take 1 tablet by mouth at bedtime as needed 2)   Omeprazole 20 Mg Cpdr (Omeprazole) .... Take 1 tablet by mouth once a day for stomach protection while taking meloxicam 3)  Meloxicam 7.5 Mg Tabs (Meloxicam) .... Take 1 tablet by mouth once a day for leg pain.  Patient Instructions: 1)  Start taking Meloxicam 7.5 mg daily for the leg pain.  Take with food! 2)  Start Taking Omeprazole 20 mg daily for stomach protection. 3)  Stop Tramadol 4)  Stop Diclofunac Gel. 5)  We will call with appointment time for the Sports Medicine Doctor. 6)  Please schedule a follow-up appointment in 1 month Prescriptions: MELOXICAM 7.5 MG TABS (MELOXICAM) Take 1 tablet by mouth once a day for leg pain.  #30 x 1   Entered and Authorized by:   Cristal Deer  Tonny Branch MD   Signed by:   Leodis Sias MD on 03/25/2010   Method used:   Electronically to        [facility]. [address] [phone]* (retail)       [address]       [place], [place]  [address]       Ph: [phone] or [phone]       Fax: [phone]   RxID:   [id number] OMEPRAZOLE 20 MG CPDR (OMEPRAZOLE) Take 1 tablet by mouth once a day for stomach protection while taking Meloxicam  #30 x 2   Entered and Authorized by:   Leodis Sias MD   Signed by:   Leodis Sias MD on 03/25/2010   Method used:   Electronically to        [facility] [address] [phone]* (retail)       [address]       [place], [place]  [address]       Ph: [phone] or [phone]       Fax: [phone]   RxID:   [id number]  Process Orders Check Orders Results:     [facility]: ABN not required for this insurance Tests Sent for requisitioning (March 26, 2010 8:46 AM):     03/25/2010: [facility] -- T-CMP with Estimated GFR [[id number]] (signed)     03/25/2010: [facility] -- T-CBC w/Diff [[id number]] (signed)     03/25/2010: [facility] -- T-TSH [phone] (signed)     03/25/2010: [facility] -- T-Sed Rate (Automated) [phone] (signed)     03/25/2010: [facility] -- T-C-Reactive Protein [phone] (signed)     03/25/2010: [facility] -- T-CK Total [[id number]] (signed)    Prevention & Chronic Care Immunizations   Influenza vaccine: Fluvax 3+  (07/11/2009)   Influenza vaccine deferral: Not available  (03/25/2010)    Tetanus booster: Not documented   Td booster deferral: Deferred  (03/25/2010)    Pneumococcal vaccine: Not documented   Pneumococcal vaccine deferral: Not indicated  (03/25/2010)  Other Screening   Pap smear: NEGATIVE FOR INTRAEPITHELIAL LESIONS OR MALIGNANCY.  (12/05/2008)   Pap smear action/deferral: Deferred-2 yr interval  (03/25/2010)   Smoking status: current  (03/25/2010)   Smoking cessation counseling: yes  (03/25/2010)  Lipids   Total Cholesterol: 241  (10/16/2009)   Lipid panel action/deferral: Lipid Panel ordered   LDL: 177  (10/16/2009)   LDL Direct: Not documented   HDL: 51  (10/16/2009)   Triglycerides: 67  (10/16/2009)

## 2010-10-13 NOTE — Progress Notes (Signed)
Summary: prior authorization/gp  Phone Note From Pharmacy   Caller: [facility] [address] [phone]* Summary of Call: Prior Authorization requested for Zolpidem 10mg .  Form completed by Dr. Baltazar Apo the faxed.  Waiting on a response. Initial call taken by: Chinita Pester RN,  October 17, 2009 12:31 PM

## 2010-10-13 NOTE — Progress Notes (Signed)
Summary: refill/ hla  Phone Note Refill Request Message from:  Fax from Pharmacy on March 20, 2010 4:37 PM  Refills Requested: Medication #1:  AMBIEN 10 MG TABS Take 1 tablet by mouth at bedtime as needed   Dosage confirmed as above?Dosage Confirmed   Last Refilled: 6/14 Initial call taken by: Marin Roberts RN,  March 20, 2010 4:38 PM  Follow-up for Phone Call        Refill approved-nurse to complete Follow-up by: Julaine Fusi  DO,  March 24, 2010 2:51 PM  Additional Follow-up for Phone Call Additional follow up Details #1::        Rx called to pharmacy Additional Follow-up by: Marin Roberts RN,  March 24, 2010 6:08 PM    Prescriptions: AMBIEN 10 MG TABS (ZOLPIDEM TARTRATE) Take 1 tablet by mouth at bedtime as needed  #30 x 0   Entered by:   Julaine Fusi  DO   Authorized by:   Melida Quitter MD   Signed by:   Julaine Fusi  DO on 03/24/2010   Method used:   Telephoned to ...       [facility] [address] [phone]* (retail)       [address]       [place], [place]  [address]       Ph: [phone] or [phone]       Fax: [phone]   RxID:   [id number]

## 2010-10-15 NOTE — Consult Note (Signed)
Summary: [facility]  [facility]   Imported By: Margie Billet 08/28/2010 10:18:39  _____________________________________________________________________  External Attachment:    Type:   Image     Comment:   External Document

## 2010-10-26 ENCOUNTER — Other Ambulatory Visit: Payer: Self-pay | Admitting: Internal Medicine

## 2010-10-29 NOTE — Telephone Encounter (Signed)
Refill called to pharmacy.

## 2010-10-31 ENCOUNTER — Inpatient Hospital Stay (INDEPENDENT_AMBULATORY_CARE_PROVIDER_SITE_OTHER)
Admission: RE | Admit: 2010-10-31 | Discharge: 2010-10-31 | Disposition: A | Discharge status: Home / Self Care | Payer: Medicaid Other | Source: Ambulatory Visit | Attending: Family Medicine | Admitting: Family Medicine

## 2010-10-31 DIAGNOSIS — J020 Streptococcal pharyngitis: Secondary | ICD-10-CM

## 2010-10-31 LAB — POCT RAPID STREP A (OFFICE): Streptococcus, Group A Screen (Direct): POSITIVE — AB

## 2010-11-05 ENCOUNTER — Telehealth: Payer: Self-pay | Admitting: *Deleted

## 2010-11-05 DIAGNOSIS — B370 Candidal stomatitis: Secondary | ICD-10-CM

## 2010-11-05 MED ORDER — NYSTATIN 100000 UNIT/ML MT SUSP: 500000.0000 [IU] | Freq: Four times a day (QID) | OROMUCOSAL | Status: AC

## 2010-11-05 NOTE — Telephone Encounter (Signed)
Pt calls to say she went to urg care for sore throat, she has strep, now several days later her tongue feels funny, has a white, strange coating on it, pharmacist told her it was probably thrush, she would like something to treat yeast from her abx, [facility] pharmacy [facility] [address]

## 2010-11-05 NOTE — Telephone Encounter (Signed)
Pt informed and reassured. 

## 2010-11-19 ENCOUNTER — Encounter: Payer: Self-pay | Admitting: Internal Medicine

## 2010-12-16 ENCOUNTER — Other Ambulatory Visit: Payer: Self-pay | Admitting: Internal Medicine

## 2010-12-21 NOTE — Telephone Encounter (Signed)
Zolpidem rx refill called to [facility].

## 2011-01-26 NOTE — Group Therapy Note (Signed)
[name], [name] [name] NO.:  [id number]   MEDICAL RECORD NO.:  [id number]          PATIENT TYPE:  WOC   LOCATION:  [facility]                   FACILITY:  [facility]   PHYSICIAN:  Sid Falcon, CNM  DATE OF BIRTH:  [birth year]   DATE OF SERVICE:                                  CLINIC NOTE   HISTORY:  The patient is here for vaginal discharge and odor x 1-2  weeks.  The patient describes as fishy.  Denies itching.  Increased  burning with intercourse.  Using condoms for birth control.  LMP was  September 27, 2008.  The patient is receiving followup at an urologist for  interstitial cystitis in which she has been placed on Elmiron.   PHYSICAL EXAMINATION:  GU:  Vaginal white adherent discharge.  No odor  noted.  No abnormal lesions.  Negative cervical motion tenderness.  No  abnormal lesions or discharge concerns.   ASSESSMENT:  Vaginal discharge.   PLAN:  Metronidazole 500 mg 1 pill b.i.d. x 7 days.  The patient  instructed as potential for bacterial vaginosis.  Wet prep was obtained  and sent to lab.   LABORATORY DATA:  Urinanalysis for a culture.  Results will be sent to  her urologist for followup.      Sid Falcon, CNM     WM/MEDQ  D:  10/02/2008  T:  10/02/2008  Job:  [id number]

## 2011-01-26 NOTE — Op Note (Signed)
NAMEJADENE, [name] [name]                  ACCOUNT NO.:  [id number]   MEDICAL RECORD NO.:  [id number]          PATIENT TYPE:  AMB   LOCATION:  ENDO                         FACILITY:  [facility]   PHYSICIAN:  John C. Madilyn Fireman, M.D.    DATE OF BIRTH:  24-Jun-1971   DATE OF PROCEDURE:  05/18/2007  DATE OF DISCHARGE:                               OPERATIVE REPORT   OPERATION/PROCEDURE:  Esophagogastroduodenoscopy with esophageal  dilatation __________ .   INDICATIONS FOR PROCEDURE:  History of esophageal stricture requiring  dilatation who also has had recent early satiety, nausea, bloating and  has been with delayed gastric emptying which has been refractory to  medical management.  Procedure is to assess for any anatomic abnormality  of the stomach or gastric outlet and to consider esophageal dilatation  for the previous stricture.   PROCEDURE:  The patient was placed in left lateral decubitus position  and placed on the pulse monitor with continuous low-flow oxygen  delivered by nasal cannula.  Propofol was administered per the  anesthesia team for this case.  The Olympus video endoscope was advanced  under direct vision into the oropharynx and esophagus.  The esophagus  was straight, of normal caliber, squamocolumnar line at 38 cm.  I did  not visually appreciate any definite stricturing, but there was  significant resistance to passage of 9.8-mm scope which finally popped  through.  There was no visible esophagitis or hiatal hernia.  The  stomach was entered and a small amount of liquid secretions were  suctioned from the fundus.  There was no retained food.  Retroflexed  view of cardia was unremarkable.  Fundus, body and pylorus appeared  normal.  The duodenum was entered, both bulb and second portion were  inspected and appeared to be within normal limits.  The CLO-test was  obtained from the antrum and small bowel biopsies were obtained to rule  out celiac disease.  Then a 50 Savary guidewire  was placed through the  endoscope channel and scope withdrawn.  Savary dilators of 14 mm and 15  mm passed consecutively over the guidewire with mild resistance and some  blood seen on all 15 mm dilator.  The dilator was removed together with  the wire and the patient returned to the recovery room in stable  condition.  She tolerated the procedure well.  There were no immediate  complications.   IMPRESSION:  1. Esophageal stricture dilated to 15 mm.  2. No anatomic abnormality of the stomach or duodenum, status post CLO-      test and duodenal biopsies.   PLAN:  Advance diet slowly and await biopsy results for continuing with  __________  for now.           ______________________________  Everardo All. Madilyn Fireman, M.D.     JCH/MEDQ  D:  05/18/2007  T:  05/18/2007  Job:  [id number]   cc:   Ileana Roup, M.D.  Fax: [phone]

## 2011-01-26 NOTE — Group Therapy Note (Signed)
NAME:  [name] [name], [name] [name] NO.:  [id number]   MEDICAL RECORD NO.:  [id number]          PATIENT TYPE:  WOC   LOCATION:  [facility]                   FACILITY:  [facility]   PHYSICIAN:  Tinnie Gens, MD        DATE OF BIRTH:  [birth year]   DATE OF SERVICE:  11/02/2007                                  CLINIC NOTE   CHIEF COMPLAINT:  Yearly exam.   HISTORY OF PRESENT ILLNESS:  The patient is a 40 year old gravida 2,  para 2 who here for yearly exam.  Complains that she has regular cycles  and was using condoms for birth control.  She is interested in possibly  something more permanent in terms of birth control, possibly tubal  ligation.  The patient also has lost 30 pounds.  States she had a full  feeling.  She has recently been treated for H. Pylori.   PAST MEDICAL HISTORY:  Significant for GERD and esophageal stricture.   PAST SURGICAL HISTORY:  Esophageal dilation 1 to 2 times a year since  1994.  C section times one.   MEDICATIONS:  Ambien 10 mg p.o. p.r.n., Tylenol as needed.   ALLERGIES:  PENICILLIN and ZITHROMAX.   PAST SURGICAL HISTORY:  C-section times one.  Esophageal dilation   OBSTETRICAL HISTORY:  She is a G2 P2, one C section, one VBAC, 7 pounds  9 ounces female.   GYN HISTORY:  Regular cycles.  LMP October 20, 1007.   SOCIAL HISTORY:  She is a smoker half pack per day.  No alcohol use.  She does not work currently.  She lives with her boyfriend and has been  with him for past 15 years.   FAMILY HISTORY:  No breast, GYN or colon cancer.  Father deceased of  heart disease.  Acute MI.  Mother has diabetes and hypertension.   14 POINT REVIEW OF SYSTEMS:  Reviewed.  Please see HPI.   EXAM:  In general she is a well-nourished female in no acute distress.  VITALS:  Blood pressure 105/71, and weight is 153.  HEENT:  Normocephalic, atraumatic.  Sclerae anicteric.  NECK:  Supple.  Normal thyroid.  LUNGS:  Clear bilaterally.  CARDIOVASCULAR:  Regular rate and  rhythm without murmurs.  ABDOMEN:  Soft, nontender, nondistended.  EXTREMITIES:  She has no sinus clubbing or edema pulse was 2+.  BREASTS:  Symmetric with everted nipples.  No masses.  Bilateral  fibrocystic change.  No supraclavicular adenopathy.  GU:  Normal external female genitalia, BUS normal.  Vagina is pink and  rugated.  Cervix is parous without lesion, uterus small, anteverted.  No  adnexal mass or tenderness.   IMPRESSION:  1. Annual exam.  2. Undesired fertility.   PLAN:  1. Pap smear today.  2. The patient given handout and probably 50 minutes was spent      discussing birth control options with the patient including Mirena      IUD, tubal ligation, and vasectomy.  The patient is not really      candidate for oral contraceptives.  Suspect the patient is already  using barrier method but do not think implantable or injection      devices are good choice either.           ______________________________  Tinnie Gens, MD     TP/MEDQ  D:  11/02/2007  T:  11/03/2007  Job:  [id number]

## 2011-01-26 NOTE — Group Therapy Note (Signed)
NAME:  [name] [name], [name] [name] NO.:  [id number]   MEDICAL RECORD NO.:  [id number]          PATIENT TYPE:  WOC   LOCATION:  [facility]                   FACILITY:  [facility]   PHYSICIAN:  Argentina Donovan, MD        DATE OF BIRTH:  1971-08-17   DATE OF SERVICE:  05/02/2008                                  CLINIC NOTE   The patient is a 40 year old Caucasian female gravida 2, para 2-0-0-2  who was the last few days went to the [facility] because of abrupt onset of  severe urinary symptoms including post voiding bleeding, spasms of the  bladder and extreme burning and frequency.  She was seen and placed on  Macrobid for 3 days.  A culture was done and they called her, after 3  days the culture showed 100,000 colonies enterobacter cloacae and she  was switched to Trimeth/sulfa.  She has been on that now for 5 days with  no relief and Pyridium has not helped.  The urine that we did today was  completely negative except for a large amount of blood.  My feeling is  that she is probably getting some relief from the antibiotic she was on  but the irritation of the bladder is still there so rather good chance  it is going to come back I will switch her to Cipro.  I am giving her 10  days of Cipro with some Pyridium.  I told her after 5 days if her  symptoms are not completely gone to call because she is going to have to  see a urologist and we will make that referral.   IMPRESSION:  Cystitis with urethritis, difficult to respond.           ______________________________  Argentina Donovan, MD     PR/MEDQ  D:  05/02/2008  T:  05/02/2008  Job:  [id number]

## 2011-01-26 NOTE — Op Note (Signed)
[name] [name], [name] [name]                  ACCOUNT NO.:  [id number]   MEDICAL RECORD NO.:  [id number]          PATIENT TYPE:  AMB   LOCATION:  [facility]                         FACILITY:  [facility]   PHYSICIAN:  Bertram Millard. Dahlstedt, M.D.DATE OF BIRTH:  12-09-1970   DATE OF PROCEDURE:  DATE OF DISCHARGE:                               OPERATIVE REPORT   ATTENDING:  Bertram Millard. Dahlstedt, M.D.   ASSISTANT:  Dr. Duane Boston.   PROCEDURE:  1. Pancystourethroscopy.  2. Right-sided retrograde pyelogram.  3. Hydrodistention.  4. Installation of the bladder with local anesthetic and Pyridium.   PREOPERATIVE DIAGNOSES:  Pelvic pain and right flank pain.   POSTOPERATIVE DIAGNOSES:  Pelvic pain and right flank pain, interstitial  cystitis.   INDICATIONS:  This is a 40 year old female with a history of voiding  dysfunction and bladder pain.  She also had a right-sided flank pain and  as such was scheduled for evaluation for interstitial cystitis, as well  as retrograde pyelogram for evaluation of her right flank pain.  The  risks and benefits were discussed with the patient preoperatively.   PROCEDURE IN DETAIL:  The patient was brought back to the operating room  and after successful induction of general endotracheal anesthesia she  was placed in dorsal lithotomy position and all pressure points were  padded appropriately.  She was prepped and draped in usual sterile  fashion.  A 22-French sheath was inserted into the patient's urethra and  pancystourethroscopy was performed.  The patient had no evidence of  stricture, tumor, foreign body, diverticula, stone or other anomaly.  At  this point, retrograde pyelogram was done by inserting a 5-French end-  hole catheter in the right ureter.  Retrograde pyelogram showed no fixed  filling defects, narrowing or other anomaly.  At this point, the end-  hole catheter was removed and the bladder was filled to 1 meter of water  pressure.   The anesthetic bladder  capacity was measured to be approximately 650 mL.  This was left in the bladder for approximately 5 minutes and the bladder  was drained and she was rescoped.  Inside her bladder there was evidence  of multiple mucosal hemorrhages, more than 30 per cystoscopic field. Due  to this, the patient is considered to have Interstitial Cystitis. At  this point, the bladder was drained and a combination of Marcaine and  Pyridium was inserted into the bladder and the procedure was ended.   Please note Bertram Millard. Dahlstedt, M.D. was the responsible surgeon and  present throughout the entirety of the case.   ESTIMATED BLOOD LOSS:  None.   URINE OUTPUT:  Unrecorded.   DRAINS:  None.   SPECIMENS:  None.   DISPOSITION:  The patient will go to the PACU for further care.      Delman Kitten, MD      Bertram Millard. Dahlstedt, M.D.  Electronically Signed    DW/MEDQ  D:  07/26/2008  T:  07/26/2008  Job:  [id number]

## 2011-01-26 NOTE — Group Therapy Note (Signed)
NAME:  ALEXSYS, ESKIN NO.:  [id number]   MEDICAL RECORD NO.:  [id number]          PATIENT TYPE:  WOC   LOCATION:  [facility]                   FACILITY:  [facility]   PHYSICIAN:  Argentina Donovan, MD        DATE OF BIRTH:  Feb 03, 1971   DATE OF SERVICE:                                  CLINIC NOTE   REASON FOR VISIT:  Annual exam.   HISTORY OF PRESENT ILLNESS:  [name] [name] is a 40 year old gravida 2,  para 2 who presents today for her annual exam.  She has no significant  complaints other than some vaginal burning and irritation which she  generally experiences fairly regular due to her history of interstitial  cystitis, but notes that she has some discharge and a little bit  worsening of her usual symptoms.  She otherwise has no significant  complaints or concerns.   PAST MEDICAL HISTORY:  1. Significant for GERD.  2. Esophageal stricture.  3. Interstitial cystitis.   PAST SURGICAL HISTORY:  1. Significant for C. section.  2. Esophageal dilation.   MEDICATIONS:  1. Hydroxyzine.  2. Ibuprofen.   GYN HISTORY:  She has no history of abnormal Pap smears.   SOCIAL HISTORY:  She is a smoker.  Does not use alcohol.   FAMILY HISTORY:  Noncontributory.   REVIEW OF SYSTEMS:  As per noted in the history of present illness.   PHYSICAL EXAMINATION:  GENERAL:  She is healthy-appearing in no acute  distress.  VITAL SIGNS:  Normal.  They are on her chart.  HEENT:  Normal.  NECK:  Supple.  Her thyroid is normal size without nodules.  LUNGS:  Clear bilaterally.  CARDIOVASCULAR:  Regular rate and rhythm.  No murmurs.  ABDOMEN:  Soft, nontender, no hepatosplenomegaly.  No rebound or  guarding.  BREASTS:  Symmetric.  There are no masses noted.  Bilateral fibrocystic  tissues noted.  There is no axillary or supraclavicular adenopathy.  GU:  Normal external female genitalia.  Her vaginal mucosa is normal.  The cervix is parous and no lesions were noted.  Pap smear was  collected.  Uterus is normal size without mass.  There are no adnexal  masses or tenderness.   ASSESSMENT/PLAN:  1. Annual examination.  2. Contraceptive counseling.  Pap smear done today.  Patient will be      notified of the results.  GC and chlamydia were also collected off      the Pap.  We discussed her birth control options.  The patient is      interested in the [place].  I told her to make a follow up      appointment for insertion.  3. Bacterial vaginosis.  A wet prep was collected and clue cells were      noted.  No hyphae noted.  The patient was given a prescription for      Flagyl 500 mg twice daily for 1 week.  I also discussed with her      eating yogurt daily or buying some probiotic pills.     ______________________________  Odie Sera, D.O.    ______________________________  Argentina Donovan, MD    MC/MEDQ  D:  12/05/2008  T:  12/05/2008  Job:  [id number]

## 2011-01-29 NOTE — Procedures (Signed)
[place]  Patient:    [name] [name], [name] [name]                         MRN: [id number] Proc. Date: 06/21/00 Adm. Date:  [id number] Attending:  Sharyn Dross                           Procedure Report  PREOPERATIVE DIAGNOSIS:  Esophageal stricture.  POSTOPERATIVE DIAGNOSIS:  Dilation of esophageal stricture to 12 mm with a balloon dilator.  PROCEDURE PERFORMED:  Esophagogastroduodenoscopy with dilatation of the esophageal stricture.  MEDICATIONS USED:  Demerol 90 mg IV, Versed 10 mg IV over a 15-minute period of time.  INSTRUMENT USED:  Olympus video panendoscope.  ENDOSCOPIST:  Sharyn Dross., M.D.  INDICATIONS FOR PROCEDURE:  This pleasant 40 year old female known to me with a previous history of esophageal strictures at this time.  Recently at an endoscopy was found to have a distal esophageal stricture and the instrument was unable to advance through it.  Subsequently serial dilatations were started.   The patients last dilatation was approximately two weeks ago where the area was dilated at this time.  It appears to be patent and open.  The patient was brought back in because of recurrent heartburn and discomforts with some dysphagia noted.  OBJECTIVE FINDINGS:  She is a pleasant female in no distress.  Her vital signs are stable.  Her HEENT examination is anicteric.  The neck was supple.  Lungs were clear.  Heart had a regular rate and rhythm without heaves, thrills, murmurs or gallops.  The abdomen was soft, no tenderness, no hepatosplenomegaly.  Extremities are within normal limits.  PLAN:  Will proceed with the endoscopic examination.  INFORMED CONSENT:  The patient was advised of the procedure, indications, and the risks involved.  The patient has agreed to have the procedure performed at this time.  A video was reviewed and the consent form was obtained.  PREOPERATIVE PREPARATION:  The patient was brought to the endoscopy unit  where an IV for IV conscious sedating medication was started.  A monitor was placed on the patient to monitor the patients vital signs and oxygen saturation. Nasal oxygen at 2 L per minute was used and after adequate sedation was performed, the procedure was begun.  The instrument was advanced with the patient in the left lateral position via the direct technique without difficulty.  The oropharyngeal epliglottis, vocal cords and piriform sinuses appeared to be grossly within normal limits.  The esophagus appeared to be within normal limits until the level of the stricture was appreciated.  Although the instrument was patent it had to slightly be forced into the region at this time for the area to give.  Otherwise, there was no other abnormalities that was noted of the esophagus.  The gastric area showed normal mucous lake and is without any evidence of acute inflammation or ulcerations noted.  The pylorus was normal and upon advancement through the pyloric canal, the duodenal bulb and second portion appeared to be within normal limits.  Retroflex view of the cardia revealed evidence of heme that was present but no mass or bleeding noted at this time. The heme was probably from when the instrument advanced through the stricture spot that was present.  The instrument was retracted back into the esophageal area.  A 12 mm balloon dilator was then used at this time.  It  was inflated into the region and held for 60 seconds.  After adequate time was held in position, the balloon was deflated at this time.  There was evidence of residual heme that was appreciated and photographs were taken of the area.  The instrument was subsequently removed per orum without difficulty at this time.  The patient tolerated the procedure well.  TREATMENT: 1. Will continue the Carafate slurry that the patient has used to protect the    area. 2. Have patient follow up with me in the office. 3. May need to  repeat the procedure but will wait approximately one month    or six weeks before this is done again.  DD:  06/21/00 TD:  06/22/00 Job: [id number]

## 2011-01-29 NOTE — Group Therapy Note (Signed)
NAME:  [name] [name], BEHNE NO.:  [id number]   MEDICAL RECORD NO.:  [id number]          PATIENT TYPE:  WOC   LOCATION[facility]  [facility]                   FACILITY:  [facility]   PHYSICIAN:  Argentina Donovan, MD        DATE OF BIRTH:  06-30-1971   DATE OF SERVICE:                                    CLINIC NOTE   The patient is a 40 year old white female, gravida 2, para 2-0-0-2, one  vaginal birth, and one C-section, who has had some irregularity in menstrual  cycle, which seems to have self-corrected.  She came in today for her yearly  check and consideration for __________ IUD.  She wants to delay the decision  on that for a while.  Her other complaint is urinary stress incontinence.   PHYSICAL EXAMINATION:  ABDOMEN:  Soft, flat, nontender, no masses, no  organomegaly, although in her lower abdomen there is an early furuncle  forming, which apparently has recurred, and for which I am sending her to a  dermatologist.  Her abdomen is clear with no sign of mass.  EXTERNAL GENITALIA:  Normal.  BUS within normal limits.  PELVIC:  The vagina is clean and well rugated with a second degree cystocele  that is at the back 2/3 of the bladder.  Cervix is clean and parous, and Pap  smear was taken.  The uterus was normal size, shape, and consistency.  Adnexa could not be well palpated because of habitus of patient.   IMPRESSION:  Normal gynecologic examination with the exception of mild  cystocele causing mild stress incontinence.   The patient is going to wait for the urological testing clinic to open here  to be tested.  She will try and call in about 6 months to see if that is  available.           ______________________________  Argentina Donovan, MD     PR/MEDQ  D:  08/12/2005  T:  08/12/2005  Job:  [id number]

## 2011-01-29 NOTE — Group Therapy Note (Signed)
NAME:  [name] [name]                            ACCOUNT NO.:  [id number]   MEDICAL RECORD NO.:  [id number]                   PATIENT TYPE:  OUT   LOCATION:  [facility]                           FACILITY:  [facility]   PHYSICIAN:  Tinnie Gens, MD                     DATE OF BIRTH:  Feb 01, 1971   DATE OF SERVICE:  05/16/2003                                    CLINIC NOTE   CHIEF COMPLAINT:  Annual examination.   HISTORY OF PRESENT ILLNESS:  The patient is a 40 year old gravida 2, para 2  who had one previous cesarean section who is here for annual examination.  She was previously on Depo-Provera, but has not been on that for some time.  She does want different kind of birth control.  She has trouble with pills  because they made her bleed.   PAST MEDICAL HISTORY:  1. Significant for elevated blood pressure during pregnancy.  2. Hepatitis A ___________.  3. History of ulcer disease.   PAST SURGICAL HISTORY:  1. Cesarean section.  2. Gallbladder removal.   ALLERGIES:  PENICILLIN, ZITHROMAX.   MEDICATIONS:  1. Aciphex.  2. Entex.  3. Tylenol No.3.   GYN HISTORY:  She had menarche age 20.  Cycles q.25-28 days.  Last five  days.  She has had no abnormal Pap smears.   FAMILY HISTORY:  Significant for diabetes in her mom and high blood pressure  in her mom, heart attack in her father.   SOCIAL HISTORY:  She denies alcohol, drug, and tobacco use.  She is not  currently working and lives with her two children.   REVIEW OF SYSTEMS:  Reviewed and is negative except for weight gain,  fatigue, swelling in her legs, and loss of urine with coughing or sneezing.   PHYSICAL EXAMINATION:  VITAL SIGNS:  Pulse 114, blood pressure 133/81,  weight 189.  She is a slightly obese female in no acute distress.  BREASTS:  Symmetric breasts with everted nipples.  She has no  supraclavicular or axillary lymphadenopathy.  ABDOMEN:  Soft, nontender, nondistended.  PELVIC:  She has normal external female  genitalia.  Cervix is visualized,  has a fish mouth shape with a previous old scar from a vaginal delivery off  to the patient's left.  There is a moderate amount of mucousy yellow  discharge.  The uterus is anteverted, small.  The adnexa are without  tenderness or mass.  Nuvaring placement of the vagina without difficulty.  The patient tolerated this well and walked around and had no problems with  it.  EXTREMITIES:  No clubbing, cyanosis, edema.   IMPRESSION:  1. Annual examination.  2. Contraceptive counseling.   PLAN:  Pap smear, GC, Chlamydia today.  Also Nuvaring prescription given and  instruction as well as an information packet.  Tinnie Gens, MD    TP/MEDQ  D:  05/16/2003  T:  05/17/2003  Job:  [id number]

## 2011-01-29 NOTE — Op Note (Signed)
[name], [name]                  ACCOUNT NO.:  [id number]   MEDICAL RECORD NO.:  [id number]          PATIENT TYPE:  AMB   LOCATION:  ENDO                         FACILITY:  [facility]   PHYSICIAN:  Bernette Redbird, M.D.   DATE OF BIRTH:  1971-07-05   DATE OF PROCEDURE:  05/05/2006  DATE OF DISCHARGE:                                 OPERATIVE REPORT   PROCEDURE:  Upper endoscopy, Savary dilatation of the esophagus.   SURGEON:  Bernette Redbird, M.D.   INDICATIONS:  This is a 41 year old female with recurrent esophageal  dysphagia symptoms, status post numerous dilatations by Dr. Madilyn Fireman  previously, most recently about 1-1/2 years ago with intermittent dysphagia  symptoms since that time but really more severe protracted symptoms over the  past 2 weeks and a transient food impaction last night.   FINDINGS:  Distal esophageal stricture, without residual food impaction.  Dilatation performed to 14 mm.   PROCEDURE:  The nature, purpose and risks of the procedure were familiar to  the patient from prior examination, but I reviewed the risks of the  procedure with her and she provided written consent.   Sedation was provided by the Anesthesia Department since this patient has a  known tendency for combativeness when attempts are made to do a procedure on  her with standard moderate IV conscious sedation.  Moreover, the anesthesia  personnel elected to intubate the patient for the procedure because of  concerns of a possible residual degree of food impaction.   The Olympus video endoscope was passed under direct vision after the patient  had been endotracheally intubated, and the esophagus was readily entered.   The mucosa of the esophagus was normal, without evidence of reflux  esophagitis, Barrett's esophagus, varices, infection or neoplasia, but there  was a smooth and ring-like stricture with tapering at the esophagus at the  squamocolumnar junction, such that the 10 mm endoscope could  not be passed  through this area.  Therefore, dilatation was performed by passing the  spring tipped Savary guidewire through the scope and through the lumen of  the esophagus blindly thereafter into the stomach after which the scope was  removed in an exchange fashion and the passage was made sequentially using  the Savary dilators, sizes 12 and 14 mm, each time encountering mild  discrete resistance in the distal esophageal region.  There was a slight  amount of heme in the lumen of the second dilator distally.  The patient was  then re-endoscoped under direct vision.  There was some fresh hemorrhage in  the region of the gastroesophageal junction, but no evidence of active or  ongoing bleeding.  Careful inspection showed what appeared to be a  superficial but broad mucosal laceration in two areas in the distal  esophagus, without actual fracture of a ring right at the squamocolumnar  junction as such.  There was no evidence of frank perforation.  The stomach  was able to be entered with the scope following the dilatation and appeared  normal, without evidence of gastritis, erosions, ulcers, polyps or masses,  and  the pylorus and second duodenum looked normal.   The scope was then removed from the patient.  She tolerated the procedure  well and there were no apparent complications.   IMPRESSION:  Smooth, benign-appearing distal esophageal stricture, without  evidence of esophagitis or residual food impaction at this time.  This no  doubt accounts for the patient's dysphagia symptoms (530.3)   PLAN:  The patient has been instructed to watch for evidence of esophageal  perforation such as chest pain, and will contact us if it occurs.  I would  favor consideration of a repeat dilatation to a larger diameter in the near  future, at the discretion of the patient and her primary gastroenterologist.           ______________________________  Bernette Redbird, M.D.     RB/MEDQ  D:   05/05/2006  T:  05/06/2006  Job:  [id number]   cc:   Madaline Guthrie, M.D.  Fax: [phone]   Everardo All. Madilyn Fireman, M.D.  Fax: [phone]

## 2011-01-29 NOTE — Op Note (Signed)
[name] [name], [name] [name]                  ACCOUNT NO.:  [id number]   MEDICAL RECORD NO.:  [id number]          PATIENT TYPE:  AMB   LOCATION:  ENDO                         FACILITY:  [facility]   PHYSICIAN:  John C. Madilyn Fireman, M.D.    DATE OF BIRTH:  23-Aug-1971   DATE OF PROCEDURE:  10/27/2004  DATE OF DISCHARGE:                                 OPERATIVE REPORT   PROCEDURE:  Esophagogastroduodenoscopy with esophageal dilatation.   INDICATIONS FOR PROCEDURE:  Distal recurrent esophageal stricture with  recurrent dysphagia.   PROCEDURE:  The patient was placed in the left lateral decubitus position  and placed on the pulse monitor with continuous low-flow oxygen delivered by  nasal cannula.  She was sedated 250 mg IV propofol and 50 mcg IV fentanyl  and 2 mg IV Versed, administered by the anesthesiologist.  The Olympus video  endoscope was advanced under direct vision into the oropharynx and  esophagus.  The esophagus was straight and of normal caliber with the  squamocolumnar line at 38 cm imbedded in a fibrotic, smooth, uninflamed-  appearing ring or stricture.  This did not permit passage of the scope tip  beyond it.  I elected to pass a guidewire through the GE junction which  passed freely for a long distance and withdrew the scope.  Savary dilators  of 12, 12.8, and 14 were passed consecutively over the guidewire with blood  seen on the last dilator only.  The last dilator was removed together with  the wire, and the patient returned to the recovery room in stable condition.  She tolerated the procedure well, and there were no immediate complications.   IMPRESSION:  Esophageal stricture dilated to 14 mm.   PLAN:  I will have her come back in 2-3 weeks and repeat dilatation  hopefully up to 16 mm.      JCH/MEDQ  D:  10/27/2004  T:  10/27/2004  Job:  [id number]   cc:   Madaline Guthrie, M.D.  [address]  [place] [address]  Fax: [phone]

## 2011-01-29 NOTE — Group Therapy Note (Signed)
NAME:  [name] [name], [name] [name] NO.:  [id number]   MEDICAL RECORD NO.:  [id number]          PATIENT TYPE:  WOC   LOCATION:  [facility]                   FACILITY:  [facility]   PHYSICIAN:  Argentina Donovan, MD        DATE OF BIRTH:  [birth year]   DATE OF SERVICE:                                    CLINIC NOTE   HISTORY OF PRESENT ILLNESS:  The patient is a 40 year old gravida 2, para 2-  0-0-2 who over the past year has been completely normal until the last  month.  She has had regular periods that have lasted only day and then,  after her last period a month ago, had one week of heavy bleeding with  cramps for 7 days.  Three weeks after the last regular period she, once  again, had a period that lasted 5 days, but the bleeding was not heavy at  that time.  Other than that, she has been in good health, and is using  condoms for contraception.  We have discussed other alternatives for her.  She gets sick with a birth control pill, and has put on great weight with  Depo-Provera, but is considering the Mirena.  We did a Pap smear today, but  discarded it because it has not been a year since her last normal Pap smear.  She will be in at the beginning of December at which time we will repeat her  Pap, and do her annual.   PHYSICAL EXAMINATION:  GENITOURINARY:  External genitalia is normal.  BUS  within normal limits.  The vagina is clean and well rugated.  The cervix is  clean and parous.  The uterus is anterior and of normal size, shape and  consistency, and the adnexa is normal.   PLAN:  Keep a menstrual chart.  Consider Mirena IUD, and when she returns in  one month, if she desires the IUD, we will insert it at that time.  I have  instructed her to take ibuprofen before she comes in.           ______________________________  Argentina Donovan, MD     PR/MEDQ  D:  07/13/2005  T:  07/13/2005  Job:  [id number]

## 2011-01-29 NOTE — Op Note (Signed)
NAME:  [name] [name], [name] [name]                            ACCOUNT NO.:  [id number]   MEDICAL RECORD NO.:  [id number]                   PATIENT TYPE:  AMB   LOCATION:  ENDO                                 FACILITY:  [facility]   PHYSICIAN:  John C. Madilyn Fireman, M.D.                 DATE OF BIRTH:  1970-12-22   DATE OF PROCEDURE:  11/06/2003  DATE OF DISCHARGE:                                 OPERATIVE REPORT   PROCEDURE:  Esophagoscopy with esophageal dilatation.   INDICATIONS:  History of esophageal stricture with recurrent dysphagia.   DESCRIPTION OF PROCEDURE:  The patient was placed in the left lateral  decubitus position and placed on the pulse monitor with continuous low-flow  oxygen delivered by nasal cannula.  She was sedated with 75 mcg IV fentanyl  and 7 mg IV Versed and 12.5 mg of IV Phenergan.  The Olympus video endoscope  was advanced under direct vision into the oropharynx and esophagus near the  GE junction at 38 cm.  There was an abrupt fibrotic narrowing and the scope  would not traverse this area.  It was also very difficult to keep the  patient comfortably sedated.  She was agitated during this phase of the  procedure and subsequently I elected to pass a guide wire through the  visible GE junction lumen and this passed freely into the stomach.  The  scope was withdrawn and then 8 mm Savary dilator was passed over the guide  wire without resistance and a 9 mm dilator was also passed.  The patient  became quite combative during this time and I elected not to pursue further  dilatation because of this.  The last dilator was removed together with the  wire and the patient returned to the recovery room in stable condition.  She  did not tolerate the procedure well but there were no immediate  complications.   IMPRESSION:  1. Tight distal esophageal stricture.  Unable to pass a standard scope     beyond it.  2. Status post dilatation to only 9 mm.   PLAN:  Will need to have the procedure  repeated under general anesthesia or  with propofol.                                               John C. Madilyn Fireman, M.D.    JCH/MEDQ  D:  11/06/2003  T:  11/06/2003  Job:  [id number]   cc:   Madaline Guthrie, M.D.  [address]  [place] [address]  Fax: [phone]

## 2011-01-29 NOTE — Discharge Summary (Signed)
[place]. [facility]  Patient:    [name] [name], [name] [name]                         MRN: [id number] Adm. Date:  [id number] Disc. Date: [id number] Attending:  Edwyna Perfect Dictator:   Blanch Media, M.D.                           Discharge Summary  DISCHARGE DIAGNOSES: 1. Pansinusitis. 2. Increased liver function tests.  DISCHARGE MEDICATIONS: 1. Nexium. 2. Afrin. 3. Saline nasal spray. 4. Nasonex. 5. Vicodin p.r.n. 6. Azithromycin 2 tsp q.d.  HISTORY OF PRESENT ILLNESS:  [name] [name] is a 40 year old white female with a history of esophageal stricture and GERD who presents to the clinic with severe left frontal sinus pain and droopy left eye lid since the day before admission.  The patient presented to the emergency department on the Saturday prior to admission with onset of fever to 102 with sore throat and nausea. The patient was diagnosed with a URI and supportive care was prescribed.  The patient also had frontal sinus pain that started on the Monday prior to admission.  The patient again presented to the ED and diagnosed with sinusitis treated with Claritin, Vicodin and Phenergan.  The sinus pain persisted and her left eye lid began drooping and the patient presented to the clinic.  PHYSICAL EXAMINATION:  VITAL SIGNS:  Temperature 99.4, heart rate 101, blood pressure 104/70, respiratory rate 16.  HEENT:  Left ptosis.  Pupils are equal, round and reactive to light. Extraocular movements intact.  Conjunctivae pink.  TMs clear.  Cervical lymphadenopathy present.  Tender left frontal sinus.  CARDIOVASCULAR:  Tachycardia, otherwise no murmurs, rubs or gallops.  LUNGS:  Clear to auscultation bilaterally.  ABDOMEN:  Benign.  NEUROLOGIC:  No focal deficits.  ADMITTING LABORATORY DATA AND X-RAY FINDINGS:  White blood cell count 10.5, ANC 75%, hemoglobin 13.2, platelets 304.  Sodium 138, potassium 2.9, chloride 97, CO2 34, BUN 9, creatinine 0.5, glucose  104.  Alk phos 168, AST 60, ALT 112, total bilirubin 0.6.  Admitting sinus CT showed pansinusitis with air fluid levels of the left sphenoid, right maxillary and bilateral frontal sinuses with air fluid levels, left greater than right.  There were no intracranial abnormalities.  HOSPITAL COURSE:  #1 - SINUSITIS:  The patient was started on Primaxin as he had a penicillin allergy and Afrin nasal spray.  The patient was continued on Vicodin for pain control.  The patient remained afebrile with no leukocytosis throughout her hospital stay.  ENT was consulted and recommended continued inpatient treatment with IV antibiotics and q.4h. neurologic checks and eye checks.  The patients discharge was held until she remained stable throughout the weekend.  Prior to discharge, the patient had decreased swelling of her eye lid.  Her extraocular muscles remained intact.  There is no diplopia on examination and her pupils are equal, round and reactive to light.  The patient was discharged on oral antibiotics to complete a course.  #2 - INCREASED LIVER FUNCTION TESTS:  The patients hepatitis panel was negative.  An ANA was checked which was pending at the time of discharge. After discussing with the patient, she stated that she had been taking Vicodin every three hours plus 1000 mg of Tylenol several times a day.  Her LFTs could be consequence of her acetaminophen use.  This is to be rechecked as  an outpatient.  Her primary care physician was made aware of these findings.  DISPOSITION:  The patient was discharged to home.  FOLLOWUP:  Arrangements for a follow-up appointment in the internal medicine clinic was to be arranged and to be contacted with these details. DD:  01/12/01 TD:  01/14/01 Job: [id number]

## 2011-01-29 NOTE — Op Note (Signed)
NAME:  [name] [name], [name] [name]                            ACCOUNT NO.:  [id number]   MEDICAL RECORD NO.:  [id number]                   PATIENT TYPE:  AMB   LOCATION:  ENDO                                 FACILITY:  [facility]   PHYSICIAN:  John C. Madilyn Fireman, M.D.                 DATE OF BIRTH:  15-Mar-1971   DATE OF PROCEDURE:  11/13/2003  DATE OF DISCHARGE:                                 OPERATIVE REPORT   PROCEDURE:  Esophagogastroduodenoscopy with esophageal dilatation.   INDICATION FOR PROCEDURE:  Esophageal stricture with difficulty sedating  patient for previous dilatation two weeks ago, only able to dilate up to 9  mm Savary dilator.  The procedure has been arranged to be done with  anesthesiologist providing Propofol for sedation.   DESCRIPTION OF PROCEDURE:  The patient was placed in the left lateral  decubitus position and placed on the pulse monitor with continuous low-flow  oxygen delivered by nasal cannula.  She was sedated with IV Propofol  administered by Dr. Rica Mast.  The Olympus video endoscope was advanced under  direct vision into the oropharynx and esophagus.  The esophagus was straight  and of normal caliber with abrupt narrowing through a very short, fibrotic  stricture at the GE junction at 37 mm.  There was some resistance to passage  of the scope beyond it but unlike with the last procedure, the scope did  pass.  There was no visible esophagitis and no visible suspicion of  neoplasm.  The stomach was entered and a small amount of liquid secretions  were suctioned from the fundus.  Retroflexed view of the cardia was  unremarkable.  The fundus, body, antrum, and pylorus all appeared normal.  The duodenum was entered and both the bulb and second portion were well-  inspected and appeared to be within normal limits.  The Savary guidewire was  placed through the endoscope channel and the scope withdrawn.  Savary  dilators of 11, 12.8, and 14 mm were passed consecutively over the  guidewire  with some blood seen on the last two dilators, and the last dilator was  removed together with the wire and the patient returned to the recovery room  in stable condition.  She tolerated the procedure well, and there were no  immediate complications.   IMPRESSION:  Distal esophageal stricture, status post dilatation to 14 mm.   PLAN:  Advance diet and observe response to dilatation and consider elective  repeat dilatation to approximately 16 mm in the next few weeks, particularly  if improvement in dysphagia is not satisfactory.                                               John C. Madilyn Fireman, M.D.    JCH/MEDQ  D:  11/13/2003  T:  11/13/2003  Job:  [id number]   cc:   Madaline Guthrie, M.D.  [address]  [place] [address]  Fax: [phone]

## 2011-01-29 NOTE — Op Note (Signed)
[place]. [facility]  Patient:    [name] [name], [name] [name]                         MRN: [id number] Proc. Date: 05/27/00 Adm. Date:  [id number] Disc. Date: [id number] Attending:  Sharyn Dross CC:         Sharyn Dross., M.D.  [facility]   Operative Report  PREOPERATIVE DIAGNOSES:  1. Dysphagia, recurrent.  2. History of esophageal stricture.  POSTOPERATIVE DIAGNOSIS: Stricture of the esophagus at approximately 35-37 cm.  OPERATION/PROCEDURE: Esophagogastroduodenoscopy with dilatation over a guide wire.  ENDOSCOPIST: Dortha Kern, Montez Hageman., M.D.  MEDICATION: Demerol 100 mg IV, Versed 10 mg IV over a 20 minute period of time.  INSTRUMENTS: Olympus video panendoscope.  INDICATIONS FOR PROCEDURE: The patient is a pleasant 40 year old female with a known history of esophageal stricture in the past.  She has undergone previous dilatations and had been on a conservative regimen without any complaints for a while.  She subsequently came in for evaluation because of recurrent difficulty swallowing at this time.  PHYSICAL EXAMINATION:  GENERAL: She is a pleasant female, in no distress.  VITAL SIGNS: Stable.  HEENT: Sclerae anicteric.  NECK: Supple.  LUNGS: Clear.  HEART: Regular rate and rhythm without heaves, thrills, murmurs, or gallops.  ABDOMEN: Soft, nontender.  No hepatosplenomegaly noted.  EXTREMITIES: Unremarkable.  PLAN: Proceed with endoscopic examination and if evidence of a stricture is noted may need to do a dilatation procedure.  INFORMED CONSENT: I have explained the procedure to the patient and the fact that we were initially going to try not dilate the area; however, if the dilatation was worse or unable to be successful we would need to dilate the region for complete evaluation at this time.  The patient was fully aware of this and has agreed to have the procedure performed.  PREPARATION: The patient was  brought to the endoscopy room and intravenous sedation medication started.  A monitor was placed to monitor the patients vital signs and oxygen saturation.  Nasal oxygen at 2 liters per minute was used and after adequate sedation was performed the procedure was begun.  DESCRIPTION OF PROCEDURE: The instrument was advanced with the patient lying in the left lateral position via direct technique without difficulty.  The oropharynx, epiglottis, vocal cords, and piriform sinus appeared to be grossly within normal limits.  The esophagus was normal, without any evidence of acute inflammation, ulcerations, or varices appreciated.  There appeared to be evidence of a distal stricture at approximately 35-37 cm.  The instrument was advanced to this region but was able to be advanced through the area at this time.  Photographs were taken of the region at this time.  Because of the difficulty attempting to advance the instrument through the area it was felt that dilatative procedure was necessary for complete evaluation.  A guide wire was advanced into the area at this time and because the previous endoscopic examination showed no evidence of any diverticulum or abnormalities it was felt the guide wire could be advanced into the gastric lumen without difficulty at this time.  Subsequently after the guide wire was advanced the instrument was remained without difficulty.  Savory dilators 9 and 10 mm were then subsequently used to advance through the area.  They were held in for 60 seconds each and then removed.  Re-endoscopy confirmed evidence with heme of the region at this time of [name] [name]  area that has been opened at this time.  The instrument was able to advance through without any abnormalities noted.  Photographs of the pre and the post procedure dilated region were then taken.  The gastric area showed no abnormalities except mild linear streaks from the instrumentation that was present.  The  pylorus was normal, and upon advancement to the pyloric canal the duodenal bulb and second portion appeared to be within normal limits.  Retroflex view of the cardia revealed no gross pathology, but heme was noted at the cardia region.  The instrument was subsequently retracted back at this point and removed without difficulty.  The patient tolerated the procedure well.  TREATMENT: I am going to recommend repeating the procedure in approximately one week to two weeks at this time and depending on results will determine course of therapy. DD:  05/27/00 TD:  05/29/00 Job: [id number]

## 2011-01-29 NOTE — Procedures (Signed)
. [facility]  Patient:    [name] [name], [name] [name]                         MRN: [id number] Proc. Date: 06/06/00 Adm. Date:  [id number] Attending:  Sharyn Dross                           Procedure Report  REFERRING PHYSICIAN:  Sharyn Dross., M.D.  PREOPERATIVE  DIAGNOSIS:  Esophageal stricture.  POSTOPERATIVE DIAGNOSIS: 1. Esophageal stricture dilated with 12 mm balloon dilator. 2. Mild duodenitis.  PROCEDURE:  Esophagogastroduodenoscopy.  MEDICATIONS:  Demerol 100 mg IV, Versed 5 mg IV over a ten minute period of time.  INSTRUMENTS:  Olympus video panendoscope.  ENDOSCOPIST:  Sharyn Dross., M.D.  INDICATIONS:  This pleasant female comes in for a series of procedures with esophageal dilatation.  The patient has had previous strictures in the past requiring dilatation that was noted.  Approximately one week ago, the patient underwent an endoscopy because of dysphagia and was found that the instrument could not advance through the area because of the stricture that was present. The patient underwent an esophagoscopy and dilatation which went excellent at at this time.  She was brought back in for a reevaluation of the area.  OBJECTIVE FINDINGS:  GENERAL:  She is a pleasant female appears to be in no acute distress.  VITAL SIGNS:  Stable.  HEENT:  Adequate.  NECK:  Supple.  LUNGS:  Clear.  HEART:  Regular rate and rhythm without murmurs or gallops.  ABDOMEN:  Soft no tenderness.  EXTREMITIES:  Grossly within normal limits.  PLAN:  Proceed with the endoscopic examination with possible dilatation noted.  INFORMED CONSENT:  The patient has been advised of the procedure and the indications.  She has agreed to have the procedure performed in a serial fashion at this time.  PREOPERATIVE PREPARATION:  The patient was brought in the endoscopy unit with an IV in place for adequate sedation.  Medication was started.  A monitor was placed on  the patient to monitor the patients vital signs and oxygen saturation.  Nasal oxygen at 2 liters per minute was used and after adequate sedation was performed the procedure was begun.  PROCEDURE NOTE:  The instrument was advanced with the patient lying in the left lateral decubitus position where the instrument was advanced without difficulty.  The oropharyngeal, epiglottis, vocal cords and piriformis sinuses felt to be grossly within normal limits.  The esophagus was normal without any evidence of acute inflammation, ulcerations, hiatal hernia proximal portion of the esophageal body.  However, in the distal area of the gastric region there appeared to be evidence of a stricture formation that was noted.  The instrument was able to advance through the area but there was some tightness on the advancement noted.  The gastric area showed a normal mucous lake without any evidence of acute inflammation that was noted.  The antral area appeared to be unremarkable  at this time.  Upon advancing to the pylorus the duodenal bulb showed evidence of of mild duodenitis that was noted.  A photograph was taken of the area.  The instrument was retracted back where retroflexion of the cardia showed no gross pathology.  Because the instrument was able to advance through those regions, it was felt that a 12 mm balloon be used for dilatation.  The balloon apparatus was then set  up and advanced onto the instrument at this time.  The instrument was gradually retracted back with the mid position of the balloon noted.  Once in adequate position the balloon was inflated to a maximum capacity at this time.  The balloon was held in position for 60 seconds at this time and then deflation was noted.  The balloon was subsequently removed from the instrument at this time.  Advancement of the instrument through the area showed good dilatative process that was present. The instrument was subsequently retracted back and  removed without difficulty.  The patient tolerated the procedure well.  TREATMENT:  Because of the easiness without any residual tears only minimally were appreciated, it is felt I will repeat this procedure in approximately two weeks if evidence that the area is relatively stable will then gradually extend the process out and the patient may only need these short dilatative procedures at this time. DD:  06/06/00 TD:  06/06/00 Job: [id number]

## 2011-01-29 NOTE — Op Note (Signed)
NAME:  [name] [name], [name] [name]                            ACCOUNT NO.:  [id number]   MEDICAL RECORD NO.:  [id number]                   PATIENT TYPE:  AMB   LOCATION:  DSC                                  FACILITY:  [facility]   PHYSICIAN:  Leonides Grills, M.D.                  DATE OF BIRTH:  1971/09/04   DATE OF PROCEDURE:  01/08/2004  DATE OF DISCHARGE:                                 OPERATIVE REPORT   PREOPERATIVE DIAGNOSES:  1. Left tight gastrocnemius.  2. Left chronic plantar fasciitis.   POSTOPERATIVE DIAGNOSES:  1. Left tight gastrocnemius.  2. Left chronic plantar fasciitis.   OPERATION PERFORMED:  1. Left gastroc slide.  2. Left plantar fascial toe pad fascial tenotomy.   SURGEON:  Leonides Grills, M.D.   ASSISTANT:  Lianne Cure, P.A.   ANESTHESIA:  General endotracheal tube.   ESTIMATED BLOOD LOSS:  Minimal.   TOURNIQUET TIME:  Approximately 20 minutes.   COMPLICATIONS:  None.   DISPOSITION:  Stable to PR.   INDICATIONS FOR PROCEDURE:  The patient is a 40 year old female who has had  longstanding plantar fasciitis that was resistant to conservative  management.  The patient  has consented for the above procedure.  All risks  which include infection, neurovascular injury, persistent pain, worsening  pain, possibility of plantar fascial rupture were all explained, questions  were encouraged and answered.   DESCRIPTION OF PROCEDURE:  The patient was brought to the operating room and  placed in supine position after adequate general endotracheal tube  anesthesia was administered as well as vancomycin 500 mg IV piggyback.  The  left lower extremity was then prepped and draped in sterile manner over a  proximally placed thigh tourniquet.  We started the procedure with  longitudinal incision on medial gastrocnemius, musculotendinous junction.  Dissection was carried down through skin, hemostasis was obtained, fascia  was opened in line with the incision.  The conjoined region  was then  developed between the gastroc soleus.  The soft tissue was then elevated off  the posterior aspect of the gastrocnemius tendon. The gastrocnemius tendon  was then tenotomized with the Mayo scissors after the sural nerve was  identified posteriorly and protected throughout the case.  This had  excellent release of the tight gastroc.  The wound was then copiously  irrigated with normal saline.  The subcutaneous tissue was closed with 3-0  Vicryl and skin was 4-0 Monocryl subcuticular stitch.  At this point the  limb was gravity exsanguinated and tourniquet was elevated 290 mmHg.  A  longitudinal incision over the medial aspect of the proximal portion of the  plantar fascia was then made.  Dissection was carried down through skin.  Plantar fascia was then identified as described and [employer] manual,  a saline bath was then created and ____________ swab was used to create a  grid in the plantar fascia.  This was done at varying depths between  superficial 3 mm to 5 mm.  The procedure was uneventful.  The wound was  copiously irrigated with normal saline.  Skin over the heel was closed with  4-0 nylon suture.  The gastroc slide area was closed with 3-0 Vicryl and  then 4-0 Monocryl subcuticular stitch.  Steri-Strips were applied.  Sterile  dressing was applied.  Cam walker boot was applied.  The patient was stable  to the PR.                                               Leonides Grills, M.D.    PB/MEDQ  D:  01/08/2004  T:  01/08/2004  Job:  [id number]

## 2011-01-29 NOTE — Group Therapy Note (Signed)
NAME:  [name] [name], [name] [name] NO.:  [id number]   MEDICAL RECORD NO.:  [id number]          PATIENT TYPE:  WOC   LOCATION:  [facility]                   FACILITY:  [facility]   PHYSICIAN:  Tinnie Gens, MD        DATE OF BIRTH:  01-09-1971   DATE OF SERVICE:  07/31/2004                                    CLINIC NOTE   CHIEF COMPLAINT:  Annual exam.   HISTORY OF PRESENT ILLNESS:  The patient is a 40 year old gravida 2 para 2-0-  0-2 who is here today for annual exam.  She was previously placed on the  NuvaRing; however, she stated that she could feel it too much and did not  want to continue that.  She is currently using condoms for birth control.   PAST MEDICAL HISTORY:  Elevated blood pressure during pregnancy, hepatitis  A, history of ulcer disease, and plantar fasciitis.   PAST SURGICAL HISTORY:  Cesarean section, gallbladder removal, and surgery  for plantar fasciitis.   ALLERGIES:  PENICILLIN, ZITHROMAX.   MEDICATIONS:  Darvocet, Ultracet, Ambien, and Prilosec.   GYNECOLOGICAL HISTORY:  Menarche at age 69, cycles every 25-28 days.  Over  the last 5 months they have only lasted for 1 day.  No history of abnormal  Pap smears.  Her LMP is July 23, 2004.  Last Pap was September 2004.   OBSTETRICAL HISTORY:  She is a G2 P2.  She had a C-section followed by a  VBAC.   FAMILY HISTORY:  Significant for diabetes and hypertension in her mom,  coronary artery disease in her father.  She denies tobacco, alcohol, or drug  use.  She is currently not working and lives with her two children.   REVIEW OF SYSTEMS:  A 14-point review of systems is reviewed and is negative  today.   PHYSICAL EXAMINATION:  VITAL SIGNS:  Her vital signs are as noted in the  chart.  Her weight is 178.6 - which is down 13 pounds from last year.  GENERAL:  She is an obese female in no acute distress.  BREASTS:  Symmetric, everted nipples.  She has no supraclavicular or  axillary adenopathy.  There is  no breast mass.  ABDOMEN:  Soft, nontender, nondistended.  PELVIC:  Shows normal external female genitalia.  The cervix is clear with a  fishmouth shape.  The uterus feels small and anteverted.  The adnexa are  without mass or tenderness, however.   LABORATORY DATA:  Urine pregnancy test.   IMPRESSION:  Annual exam with contraceptive counseling.   PLAN:  1.  Pap smear today; rule out pregnancy.  2.  Advised the patient about 95% effectiveness of condom use; however, she      declines other types of birth control at this moment.      TP/MEDQ  D:  07/31/2004  T:  07/31/2004  Job:  [id number]

## 2011-01-29 NOTE — Op Note (Signed)
NAMEJOUA, [name] [name]                  ACCOUNT NO.:  [id number]   MEDICAL RECORD NO.:  [id number]          PATIENT TYPE:  AMB   LOCATION:  DAY                          FACILITY:  [facility]   PHYSICIAN:  John C. Madilyn Fireman, M.D.    DATE OF BIRTH:  01-12-1971   DATE OF PROCEDURE:  11/18/2004  DATE OF DISCHARGE:                                 OPERATIVE REPORT   PROCEDURE PERFORMED:  Esophagogastroduodenoscopy with esophageal dilatation.   INDICATIONS FOR PROCEDURE:  Tight lower esophageal stricture dilated a few  weeks ago to 14 mm it was anticipated she would further need dilatation to  achieve satisfactory relief of her dysphagia. She is brought in for elective  dilatation today.   PROCEDURE:  The patient was placed in the left lateral decubitus position  and placed on the pulse monitor with continuous low-flow oxygen delivered by  nasal cannula. Anesthesiology was requested to perform sedation for this  procedure which was done with 200 mcg IV fentanyl 4 milligrams IV Versed and  150 milligrams of IV propofol the Olympus video endoscope was advanced under  direct vision into the oropharynx and esophagus.  The esophagus was straight  and of normal caliber with the squamocolumnar line at 38 cm with a smooth  circumferential stricture with slight resistance to passage of scope beyond  it. There was no visible inflammation nor visible suspicion of neoplasm. The  stomach was entered and small amount of liquid secretions were suctioned  fundus. Retroflexed view of cardia was unremarkable.  The fundus, body,  antrum and pylorus all appeared normal. The duodenum was entered and both  the bulb and second portion were well inspected and appeared be within  normal limits. A Savary guidewire was placed through the endoscope channel  and the scope withdrawn. Savary dilators of 14, 15 and 16 mm passed  consecutively over the guide wire with moderate resistance and slight blood  seen on removal of the last two  dilators.  The last dilator was removed  together with wire and the patient returned to the recovery room in stable  condition. She tolerated the procedure well. There were no immediate  complications.   IMPRESSION:  Persistent esophageal stricture dilated to 16 mm.   PLAN:  Advance diet and observe response to dilatation.      JCH/MEDQ  D:  11/18/2004  T:  11/18/2004  Job:  [id number]   cc:   Madaline Guthrie, M.D.  [address]  [place] [address]  Fax: [phone]

## 2011-01-29 NOTE — Op Note (Signed)
[name], [name] [name]                  ACCOUNT NO.:  [id number]   MEDICAL RECORD NO.:  [id number]          PATIENT TYPE:  AMB   LOCATION:  ENDO                         FACILITY:  [facility]   PHYSICIAN:  John C. Madilyn Fireman, M.D.    DATE OF BIRTH:  [birth year]   DATE OF PROCEDURE:  06/16/2006  DATE OF DISCHARGE:                                 OPERATIVE REPORT   PROCEDURE:  Esophagogastroduodenoscopy with esophageal dilatation.   INDICATION FOR PROCEDURE:  Dysphagia with known esophageal stricture.   DESCRIPTION OF PROCEDURE:  The patient was placed in the left lateral  decubitus position and placed on the pulse monitor with continuous low-flow  oxygen delivered by nasal cannula.  Sedation was administered by the  anesthesia department.  She received 5 mcg IV fentanyl and 1000 mcg of  Alfenta and 120 mcg of Propofol.  The Olympus video endoscope was advanced  under direct vision into the oropharynx and esophagus.  The esophagus was  straight and of normal caliber with the concentric narrowing at 36 cm with  slight resistance of passage of the scope through it.  There was no visible  inflammation.  The stomach was entered, and a small amount of liquid  secretions were suctioned from the fundus.  Retroflexed view of the cardia  was unremarkable.  The fundus, body, antrum, and pylorus all appeared  normal.  The duodenum was entered, and both the bulb and second portion were  well-inspected and appeared to be within normal limits.  A Savary guidewire  was placed through the endoscope channel and the scope withdrawn.  Savary  dilators of 14, 15, and 16 mm were passed over the guidewire with mild to  moderate resistance and some blood seen on the last 2 dilators.  The last  dilator was removed together with the wire and the patient returned to the  recovery room in stable condition.  She tolerated the procedure well, and  there were no immediate complications.   IMPRESSION:  Esophageal stricture or ring,  status post dilatation.   PLAN:  Advance diet and observe response to dilatation.           ______________________________  Everardo All Madilyn Fireman, M.D.     JCH/MEDQ  D:  06/16/2006  T:  06/17/2006  Job:  [id number]

## 2011-02-12 ENCOUNTER — Other Ambulatory Visit: Payer: Self-pay | Admitting: Internal Medicine

## 2011-02-12 NOTE — Telephone Encounter (Signed)
Refill for Ambien called to pt's Pharmacy.

## 2011-03-11 ENCOUNTER — Ambulatory Visit (INDEPENDENT_AMBULATORY_CARE_PROVIDER_SITE_OTHER): Payer: Medicaid Other | Admitting: Internal Medicine

## 2011-03-11 VITALS — BP 107/71 | HR 87 | Temp 97.6°F | Wt 145.0 lb

## 2011-03-11 DIAGNOSIS — N3090 Cystitis, unspecified without hematuria: Secondary | ICD-10-CM

## 2011-03-11 DIAGNOSIS — R300 Dysuria: Secondary | ICD-10-CM

## 2011-03-11 LAB — URINALYSIS, ROUTINE W REFLEX MICROSCOPIC
Ketones, ur: NEGATIVE mg/dL
Nitrite: NEGATIVE
Protein, ur: NEGATIVE mg/dL
Urobilinogen, UA: 0.2 mg/dL (ref 0.0–1.0)
pH: 7 (ref 5.0–8.0)

## 2011-03-11 LAB — POCT URINALYSIS DIPSTICK
Ketones, UA: NEGATIVE
Nitrite, UA: NEGATIVE
Protein, UA: NEGATIVE
Urobilinogen, UA: 0.2

## 2011-03-11 MED ORDER — CIPROFLOXACIN 500 MG/5ML (10%) PO SUSR: 500.0000 mg | Freq: Two times a day (BID) | ORAL | Status: AC

## 2011-03-11 NOTE — Patient Instructions (Signed)
Please follow-up with Urology

## 2011-03-12 LAB — URINALYSIS, MICROSCOPIC ONLY: Casts: NONE SEEN

## 2011-03-14 ENCOUNTER — Encounter: Payer: Self-pay | Admitting: Internal Medicine

## 2011-03-14 NOTE — Assessment & Plan Note (Signed)
Patient most likely has cystitis. Urine dip showed many leukocytes. Patient was started on ciprofloxacin which she tolerated well in the past. We'll send urine for cultures and sensitivity. I recommended the patient to follow up with urology. She did not followup since one year since she was feeling better.

## 2011-03-14 NOTE — Progress Notes (Signed)
  Subjective:    Patient ID: [name] [name], female    DOB: 1971/08/16, 40 y.o.   MRN: [id number]  HPI  This is a 40 year old female with past medical history significant for recurrent cystitis, anxiety and depression who presented to the clinic with history of dysuria. Patient endorses pain exactly after passing urine. The pain is located in the bladder area and is kind of sharp, burning sensation. She also noted increased frequency but denies any flank pain, fevers or chills, nausea or vomiting. Patient is sexually active.     Review of Systems  Constitutional: Negative for fever, chills and fatigue.  Respiratory: Negative for chest tightness and shortness of breath.   Cardiovascular: Negative for chest pain and palpitations.  Gastrointestinal: Positive for abdominal pain. Negative for nausea, vomiting, constipation and abdominal distention.  Genitourinary: Positive for dysuria, urgency and frequency. Negative for hematuria, flank pain, vaginal discharge and pelvic pain.  Musculoskeletal: Negative for back pain.       Objective:   Physical Exam  Constitutional: She is oriented to person, place, and time. She appears well-developed and well-nourished.  Neck: Neck supple.  Cardiovascular: Normal rate, regular rhythm and normal heart sounds.   Pulmonary/Chest: Effort normal and breath sounds normal.  Abdominal: Soft. Bowel sounds are normal. She exhibits no distension. There is no tenderness.  Musculoskeletal: Normal range of motion.  Neurological: She is alert and oriented to person, place, and time.  Skin: Skin is dry.          Assessment & Plan:

## 2011-04-06 ENCOUNTER — Other Ambulatory Visit: Payer: Self-pay | Admitting: Internal Medicine

## 2011-04-07 NOTE — Telephone Encounter (Signed)
Zolpidem rx called to [facility] pharmacy.

## 2011-04-12 ENCOUNTER — Telehealth: Payer: Self-pay | Admitting: *Deleted

## 2011-04-12 ENCOUNTER — Ambulatory Visit (INDEPENDENT_AMBULATORY_CARE_PROVIDER_SITE_OTHER): Payer: Medicaid Other | Admitting: Internal Medicine

## 2011-04-12 VITALS — BP 130/80 | HR 98 | Temp 98.0°F | Ht 60.0 in | Wt 143.0 lb

## 2011-04-12 DIAGNOSIS — N390 Urinary tract infection, site not specified: Secondary | ICD-10-CM | POA: Insufficient documentation

## 2011-04-12 LAB — URINALYSIS, ROUTINE W REFLEX MICROSCOPIC
Protein, ur: NEGATIVE mg/dL
Urobilinogen, UA: 0.2 mg/dL (ref 0.0–1.0)

## 2011-04-12 MED ORDER — SULFAMETHOXAZOLE-TRIMETHOPRIM 800-160 MG PO TABS: 1.0000 | ORAL_TABLET | Freq: Two times a day (BID) | ORAL | Status: AC

## 2011-04-12 NOTE — Patient Instructions (Signed)
Please take your medicines as prescribed. I will call you when your final results will be back and if we may need to change antibiotics. Please call the clinic if your symptoms persist or get worse after finishing the complete antibiotic course.  Urinary Tract Infection (UTI) Infections of the urinary tract can start in several places. A bladder infection (cystitis), a kidney infection (pyelonephritis), and a prostate infection (prostatitis) are different types of urinary tract infections. They usually get better if treated with medicines (antibiotics) that kill germs. Take all the medicine until it is gone. You or your child may feel better in a few days, but TAKE ALL MEDICINE or the infection may not respond and may become more difficult to treat. HOME CARE INSTRUCTIONS  Drink enough water and fluids to keep the urine clear or pale yellow. Cranberry juice is especially recommended, in addition to large amounts of water.   Avoid caffeine, tea, and carbonated beverages. They tend to irritate the bladder.   Alcohol may irritate the prostate.   Only take over-the-counter or prescription medicines for pain, discomfort, or fever as directed by your caregiver.  FINDING OUT THE RESULTS OF YOUR TEST Not all test results are available during your visit. If your or your child's test results are not back during the visit, make an appointment with your caregiver to find out the results. Do not assume everything is normal if you have not heard from your caregiver or the medical facility. It is important for you to follow up on all test results. TO PREVENT FURTHER INFECTIONS:  Empty the bladder often. Avoid holding urine for long periods of time.   After a bowel movement, women should cleanse from front to back. Use each tissue only once.   Empty the bladder before and after sexual intercourse.  SEEK MEDICAL CARE IF:  There is back pain.   You or your child has an oral temperature above .   Your  baby is older than 3 months with a rectal temperature of 100.5 F (38.1 C) or higher for more than 1 day.   Your or your child's problems (symptoms) are no better in 3 days. Return sooner if you or your child is getting worse.  SEEK IMMEDIATE MEDICAL CARE IF:  There is severe back pain or lower abdominal pain.   You or your child develops chills.  Your baby is older than 3 months with a rectal temperature of 102 F (38.9 C) or higher.   Your baby is 2 months old or younger with a rectal temperature of 100.4 F (38 C) or higher.   There is nausea or vomiting.   There is continued burning or discomfort with urination.  MAKE SURE YOU:  Understand these instructions.   Will watch this condition.   Will get help right away if you or your child is not doing well or gets worse.  Document Released: 06/09/2005 Document Re-Released: 11/24/2009 [facility] Patient Information 2011 [place], [place].

## 2011-04-12 NOTE — Telephone Encounter (Signed)
Pt called with c/o UTI, she was treated for same on 6/28 with Cipro for 7 days.  Now symptoms return. Onset over the week end.  She is having burning with and after urination.   Her daughter had a miscarriage this AM so that's why she is calling so late.  We have appointments for tomorrow.  Could we have her come by and leave a sample urine, give her Rx for antibiotic and give her an appointment for tomorrow?  I hate to make her wait another night.  Pt # [id number]

## 2011-04-12 NOTE — Telephone Encounter (Signed)
As we discussed, let's put her in today at 4:15pm.

## 2011-04-12 NOTE — Assessment & Plan Note (Signed)
Patient presents with recurrent symptoms of dysuria  within a month of finishing her antibiotic course (ciprofloxacin for 7 days for Klebsiella UTI). Her dipstick showed moderate leukocytes. We'll check her UA, urine culture and sensitivities. We'll treat her empirically with Bactrim for 5 days. Patient was advised to drink plenty of fluids, postcoital voiding which might help her with her recurrent UTIs. Patient was informed that based on her final results if her  antibiotics needs to be changed, I will call in for that. Patient voices understanding.

## 2011-04-12 NOTE — Telephone Encounter (Signed)
Will see today.  

## 2011-04-12 NOTE — Progress Notes (Signed)
Subjective:    Patient ID: [name] [name], female    DOB: April 21, 1971, 40 y.o.   MRN: [id number]  HPI: 40 year old woman with past medical history significant for depression, cystitis, comes to the clinic for dysuria  for one week.  The patient was seen in the clinic a month ago in the last week of June with similar complaints of dysuria and her urine cultures grew Klebsiella for which she was treated with 7 days of ciprofloxacin. She states that she was feeling fine in the interim until a week ago when she started having some intermittent burning again. The symptoms were progressively getting worse and got worst over the weekend (a day prior to her clinic visit )and she could not tolerate her dysuria. She also reports some suprapubic discomfort whenever she urinates along with some urgency and chills. Denies any frequency,fever,  nausea and vomiting. She is not sure if she has noticed any blood in her urine but says that she has been always told that she had hematuria with her previous UTI's. She has been drinking a lot of water. She states she has been sexually active but she does not do post - coitus voiding every time.  Of note patient has been treated for recurrent UTIs around 2 years ago and was also referred to [facility] at that time. As per the patient she got up some  interstitial cystitis when she required treatment with different antibiotics.   Review of Systems  Constitutional: Negative for fever, chills, diaphoresis and fatigue.  HENT: Negative for nosebleeds, congestion, rhinorrhea, sneezing and postnasal drip.   Eyes: Negative for discharge and redness.  Respiratory: Negative for apnea, cough, choking, chest tightness, shortness of breath, wheezing and stridor.   Cardiovascular: Negative for chest pain, palpitations and leg swelling.  Gastrointestinal: Negative for abdominal distention.  Genitourinary: Positive for dysuria. Negative for urgency, frequency, hematuria, flank pain  and vaginal discharge.  Musculoskeletal: Negative for arthralgias.  Neurological: Negative for dizziness, light-headedness and numbness.  Hematological: Negative for adenopathy.       Objective:   Physical Exam  Constitutional: She is oriented to person, place, and time. She appears well-developed and well-nourished. No distress.  HENT:  Head: Normocephalic and atraumatic.  Right Ear: External ear normal.  Left Ear: External ear normal.  Eyes: Conjunctivae and EOM are normal. Pupils are equal, round, and reactive to light. Right eye exhibits no discharge. Left eye exhibits no discharge. No scleral icterus.  Neck: Normal range of motion. Neck supple. No JVD present. No tracheal deviation present. No thyromegaly present.  Cardiovascular: Normal rate, regular rhythm, normal heart sounds and intact distal pulses.  Exam reveals no gallop and no friction rub.   No murmur heard. Pulmonary/Chest: Effort normal and breath sounds normal. No stridor. No respiratory distress. She has no wheezes. She has no rales. She exhibits no tenderness.  Abdominal: Soft. Bowel sounds are normal. She exhibits no distension and no mass. There is no rebound and no guarding.       No suprapubic tenderness, no CVA tenderness.  Genitourinary: Guaiac negative stool. No vaginal discharge found.  Musculoskeletal: Normal range of motion. She exhibits no edema and no tenderness.  Lymphadenopathy:    She has no cervical adenopathy.  Neurological: She is alert and oriented to person, place, and time. She displays normal reflexes. No cranial nerve deficit. She exhibits normal muscle tone. Coordination normal.  Skin: Skin is warm. She is not diaphoretic.  Assessment & Plan:

## 2011-04-13 ENCOUNTER — Encounter: Payer: Self-pay | Admitting: Internal Medicine

## 2011-04-13 LAB — URINALYSIS, MICROSCOPIC ONLY: Casts: NONE SEEN

## 2011-04-14 LAB — URINE CULTURE: Colony Count: 8000

## 2011-05-05 ENCOUNTER — Other Ambulatory Visit: Payer: Self-pay | Admitting: Internal Medicine

## 2011-05-05 DIAGNOSIS — G479 Sleep disorder, unspecified: Secondary | ICD-10-CM

## 2011-05-05 NOTE — Telephone Encounter (Signed)
Rx called in 

## 2011-05-05 NOTE — Telephone Encounter (Signed)
Reviewed chart, no red flags, does not appear to have inappropriate use. I will refill for now but will need to see PCP to discuss long term treatment of insomnia.

## 2011-06-02 ENCOUNTER — Other Ambulatory Visit: Payer: Self-pay | Admitting: Internal Medicine

## 2011-06-03 NOTE — Telephone Encounter (Signed)
Ambien rx called to [facility] pharmacy. 

## 2011-06-11 LAB — POCT URINALYSIS DIP (DEVICE)
Bilirubin Urine: NEGATIVE
Ketones, ur: NEGATIVE
pH: 6

## 2011-06-11 LAB — URINE CULTURE: Colony Count: >=100000

## 2011-06-11 LAB — POCT PREGNANCY, URINE: Preg Test, Ur: NEGATIVE

## 2011-06-15 LAB — POCT PREGNANCY, URINE: Preg Test, Ur: NEGATIVE

## 2011-06-16 LAB — POCT URINALYSIS DIP (DEVICE)
Bilirubin Urine: NEGATIVE
Ketones, ur: NEGATIVE
Protein, ur: NEGATIVE
Specific Gravity, Urine: >=1.03
pH: 5.5

## 2011-06-16 LAB — URINE CULTURE
Colony Count: NO GROWTH
Culture: NO GROWTH

## 2011-06-25 LAB — BASIC METABOLIC PANEL
BUN: 5 — ABNORMAL LOW
Calcium: 9
GFR calc non Af Amer: >60
Potassium: 3.6
Sodium: 138

## 2011-06-25 LAB — CBC
HCT: 38.6
Platelets: 266
RDW: 12
WBC: 5.6

## 2011-08-17 ENCOUNTER — Other Ambulatory Visit: Payer: Self-pay | Admitting: Internal Medicine

## 2011-08-18 ENCOUNTER — Other Ambulatory Visit: Payer: Self-pay | Admitting: Internal Medicine

## 2011-08-18 NOTE — Telephone Encounter (Signed)
Ambien rx called to [facility] pharmacy. 

## 2011-09-14 DIAGNOSIS — H3530 Unspecified macular degeneration: Secondary | ICD-10-CM

## 2011-09-14 HISTORY — DX: Unspecified macular degeneration: H35.30

## 2011-09-20 ENCOUNTER — Ambulatory Visit: Payer: Medicaid Other

## 2011-10-25 ENCOUNTER — Other Ambulatory Visit: Payer: Self-pay | Admitting: Internal Medicine

## 2011-10-25 DIAGNOSIS — O021 Missed abortion: Secondary | ICD-10-CM

## 2011-10-26 NOTE — Telephone Encounter (Signed)
Called to pharm 

## 2011-11-09 ENCOUNTER — Other Ambulatory Visit (INDEPENDENT_AMBULATORY_CARE_PROVIDER_SITE_OTHER): Payer: Medicaid Other

## 2011-11-09 ENCOUNTER — Other Ambulatory Visit: Payer: Self-pay | Admitting: Internal Medicine

## 2011-11-09 ENCOUNTER — Telehealth: Payer: Self-pay | Admitting: *Deleted

## 2011-11-09 DIAGNOSIS — N912 Amenorrhea, unspecified: Secondary | ICD-10-CM

## 2011-11-09 LAB — POCT URINE PREGNANCY: Preg Test, Ur: POSITIVE

## 2011-11-09 NOTE — Telephone Encounter (Signed)
Pt came by clinic and thinks she is pregnant. She took test at home and were positive. She just returned from eye doctor ( Dr Elmer Picker )and was told she had macular degeneration and they want to start her on eye vitamins but she needs to find out if she is pregnant and if she is can she take these eye vitamins. Vitamin C 250 mg, Vitamin E 200 IU, Zinc 7.5 mg Copper 0.5 mg Lutein 12.5 mg Bilberry 10 mg and Zeaxanthin 5 mg.  She takes 2 tabs a day. She will also take 1 fish oil tab a day.

## 2011-11-09 NOTE — Progress Notes (Signed)
Addended by: Bufford Spikes on: 11/09/2011 04:52 PM   Modules accepted: Orders

## 2011-11-09 NOTE — Telephone Encounter (Signed)
Please enter urine preg. Test  Test was done and positive, pt informed and will call OB at [facility] tomorrow for appointment. She will bring list of vitamins for OB MD to decide on.

## 2011-11-10 ENCOUNTER — Telehealth: Payer: Self-pay | Admitting: *Deleted

## 2011-11-10 NOTE — Telephone Encounter (Signed)
I called [facility] clinic and was told they only accept High Risk Pregnancies  I explained pt's condition and age and they will accept her. Appointment made for 3/13 at 9:30 @ clinics on ground floor.  Pt has been informed, she did ask about taking the vitamins for MD and I referred her back to the eye MD to see if there is something she can take until seen by OB.

## 2011-11-10 NOTE — Telephone Encounter (Signed)
Pt called and asked if she was taking any of her meds.  She stopped all 3 meds when she thought she was pregnant.  She will call if she can't sleep, or has a problem with GERD and make an appointment to talk about safe meds.

## 2011-11-10 NOTE — Telephone Encounter (Signed)
Pt called, was seen in clinic yesterday for preg test that was positive.  Her last delivery was at [facility] @ [facility] and was sent there yesterday.  She called today at said they only see high risk pregnancy pt's.  Pt has medicaid and needs OB referral.  Pt # [id number]

## 2011-11-24 ENCOUNTER — Other Ambulatory Visit (HOSPITAL_COMMUNITY)
Admission: RE | Admit: 2011-11-24 | Discharge: 2011-11-24 | Disposition: A | Discharge status: Home / Self Care | Payer: Medicaid Other | Source: Ambulatory Visit | Attending: Family Medicine | Admitting: Family Medicine

## 2011-11-24 ENCOUNTER — Encounter: Payer: Self-pay | Admitting: Family Medicine

## 2011-11-24 ENCOUNTER — Ambulatory Visit (INDEPENDENT_AMBULATORY_CARE_PROVIDER_SITE_OTHER): Payer: Medicaid Other | Admitting: Family Medicine

## 2011-11-24 ENCOUNTER — Other Ambulatory Visit: Payer: Self-pay | Admitting: Family Medicine

## 2011-11-24 VITALS — BP 115/74 | Temp 97.8°F

## 2011-11-24 DIAGNOSIS — Z01419 Encounter for gynecological examination (general) (routine) without abnormal findings: Secondary | ICD-10-CM | POA: Insufficient documentation

## 2011-11-24 DIAGNOSIS — N390 Urinary tract infection, site not specified: Secondary | ICD-10-CM

## 2011-11-24 DIAGNOSIS — O09299 Supervision of pregnancy with other poor reproductive or obstetric history, unspecified trimester: Secondary | ICD-10-CM

## 2011-11-24 DIAGNOSIS — O09529 Supervision of elderly multigravida, unspecified trimester: Secondary | ICD-10-CM

## 2011-11-24 DIAGNOSIS — Z3480 Encounter for supervision of other normal pregnancy, unspecified trimester: Secondary | ICD-10-CM | POA: Insufficient documentation

## 2011-11-24 DIAGNOSIS — O34219 Maternal care for unspecified type scar from previous cesarean delivery: Secondary | ICD-10-CM | POA: Insufficient documentation

## 2011-11-24 LAB — POCT URINALYSIS DIP (DEVICE)
Bilirubin Urine: NEGATIVE
Glucose, UA: NEGATIVE mg/dL
Leukocytes, UA: NEGATIVE
Nitrite: NEGATIVE
Urobilinogen, UA: 0.2 mg/dL (ref 0.0–1.0)

## 2011-11-24 MED ORDER — COMPLETENATE 29-1 MG PO CHEW: 1.0000 | CHEWABLE_TABLET | Freq: Every day | ORAL | Status: DC

## 2011-11-24 NOTE — Assessment & Plan Note (Signed)
Proven pelvis 7#1oz

## 2011-11-24 NOTE — Patient Instructions (Signed)
Pregnancy - First Trimester  During sexual intercourse, millions of sperm go into the vagina. Only 1 sperm will penetrate and fertilize the female egg while it is in the Fallopian tube. One week later, the fertilized egg implants into the wall of the uterus. An embryo begins to develop into a baby. At 6 to 8 weeks, the eyes and face are formed and the heartbeat can be seen on ultrasound. At the end of 12 weeks (first trimester), all the baby's organs are formed. Now that you are pregnant, you will want to do everything you can to have a healthy baby. Two of the most important things are to get good prenatal care and follow your caregiver's instructions. Prenatal care is all the medical care you receive before the baby's birth. It is given to prevent, find, and treat problems during the pregnancy and childbirth.  PRENATAL EXAMS   During prenatal visits, your weight, blood pressure and urine are checked. This is done to make sure you are healthy and progressing normally during the pregnancy.   A pregnant woman should gain 25 to 35 pounds during the pregnancy. However, if you are over weight or underweight, your caregiver will advise you regarding your weight.   Your caregiver will ask and answer questions for you.   Blood work, cervical cultures, other necessary tests and a Pap test are done during your prenatal exams. These tests are done to check on your health and the probable health of your baby. Tests are strongly recommended and done for HIV with your permission. This is the virus that causes AIDS. These tests are done because medications can be given to help prevent your baby from being born with this infection should you have been infected without knowing it. Blood work is also used to find out your blood type, previous infections and follow your blood levels (hemoglobin).   Low hemoglobin (anemia) is common during pregnancy. Iron and vitamins are given to help prevent this. Later in the pregnancy, blood  tests for diabetes will be done along with any other tests if any problems develop. You may need tests to make sure you and the baby are doing well.   You may need other tests to make sure you and the baby are doing well.  CHANGES DURING THE FIRST TRIMESTER (THE FIRST 3 MONTHS OF PREGNANCY)  Your body goes through many changes during pregnancy. They vary from person to person. Talk to your caregiver about changes you notice and are concerned about. Changes can include:   Your menstrual period stops.   The egg and sperm carry the genes that determine what you look like. Genes from you and your partner are forming a baby. The female genes determine whether the baby is a boy or a girl.   Your body increases in girth and you may feel bloated.   Feeling sick to your stomach (nauseous) and throwing up (vomiting). If the vomiting is uncontrollable, call your caregiver.   Your breasts will begin to enlarge and become tender.   Your nipples may stick out more and become darker.   The need to urinate more. Painful urination may mean you have a bladder infection.   Tiring easily.   Loss of appetite.   Cravings for certain kinds of food.   At first, you may gain or lose a couple of pounds.   You may have changes in your emotions from day to day (excited to be pregnant or concerned something may go wrong with   the pregnancy and baby).   You may have more vivid and strange dreams.  HOME CARE INSTRUCTIONS    It is very important to avoid all smoking, alcohol and un-prescribed drugs during your pregnancy. These affect the formation and growth of the baby. Avoid chemicals while pregnant to ensure the delivery of a healthy infant.   Start your prenatal visits by the 12th week of pregnancy. They are usually scheduled monthly at first, then more often in the last 2 months before delivery. Keep your caregiver's appointments. Follow your caregiver's instructions regarding medication use, blood and lab tests, exercise, and  diet.   During pregnancy, you are providing food for you and your baby. Eat regular, well-balanced meals. Choose foods such as meat, fish, milk and other low fat dairy products, vegetables, fruits, and whole-grain breads and cereals. Your caregiver will tell you of the ideal weight gain.   You can help morning sickness by keeping soda crackers at the bedside. Eat a couple before arising in the morning. You may want to use the crackers without salt on them.   Eating 4 to 5 small meals rather than 3 large meals a day also may help the nausea and vomiting.   Drinking liquids between meals instead of during meals also seems to help nausea and vomiting.   A physical sexual relationship may be continued throughout pregnancy if there are no other problems. Problems may be early (premature) leaking of amniotic fluid from the membranes, vaginal bleeding, or belly (abdominal) pain.   Exercise regularly if there are no restrictions. Check with your caregiver or physical therapist if you are unsure of the safety of some of your exercises. Greater weight gain will occur in the last 2 trimesters of pregnancy. Exercising will help:   Control your weight.   Keep you in shape.   Prepare you for labor and delivery.   Help you lose your pregnancy weight after you deliver your baby.   Wear a good support or jogging bra for breast tenderness during pregnancy. This may help if worn during sleep too.   Ask when prenatal classes are available. Begin classes when they are offered.   Do not use hot tubs, steam rooms or saunas.   Wear your seat belt when driving. This protects you and your baby if you are in an accident.   Avoid raw meat, uncooked cheese, cat litter boxes and soil used by cats throughout the pregnancy. These carry germs that can cause birth defects in the baby.   The first trimester is a good time to visit your dentist for your dental health. Getting your teeth cleaned is OK. Use a softer toothbrush and brush  gently during pregnancy.   Ask for help if you have financial, counseling or nutritional needs during pregnancy. Your caregiver will be able to offer counseling for these needs as well as refer you for other special needs.   Do not take any medications or herbs unless told by your caregiver.   Inform your caregiver if there is any mental or physical domestic violence.   Make a list of emergency phone numbers of family, friends, hospital, and police and fire departments.   Write down your questions. Take them to your prenatal visit.   Do not douche.   Do not cross your legs.   If you have to stand for long periods of time, rotate you feet or take small steps in a circle.   You may have more vaginal secretions that may   require a sanitary pad. Do not use tampons or scented sanitary pads.  MEDICATIONS AND DRUG USE IN PREGNANCY   Take prenatal vitamins as directed. The vitamin should contain 1 milligram of folic acid. Keep all vitamins out of reach of children. Only a couple vitamins or tablets containing iron may be fatal to a baby or young child when ingested.   Avoid use of all medications, including herbs, over-the-counter medications, not prescribed or suggested by your caregiver. Only take over-the-counter or prescription medicines for pain, discomfort, or fever as directed by your caregiver. Do not use aspirin, ibuprofen, or naproxen unless directed by your caregiver.   Let your caregiver also know about herbs you may be using.   Alcohol is related to a number of birth defects. This includes fetal alcohol syndrome. All alcohol, in any form, should be avoided completely. Smoking will cause low birth rate and premature babies.   Street or illegal drugs are very harmful to the baby. They are absolutely forbidden. A baby born to an addicted mother will be addicted at birth. The baby will go through the same withdrawal an adult does.   Let your caregiver know about any medications that you have to take  and for what reason you take them.  MISCARRIAGE IS COMMON DURING PREGNANCY  A miscarriage does not mean you did something wrong. It is not a reason to worry about getting pregnant again. Your caregiver will help you with questions you may have. If you have a miscarriage, you may need minor surgery.  SEEK MEDICAL CARE IF:   You have any concerns or worries during your pregnancy. It is better to call with your questions if you feel they cannot wait, rather than worry about them.  SEEK IMMEDIATE MEDICAL CARE IF:    An unexplained oral temperature above 102 F (38.9 C) develops, or as your caregiver suggests.   You have leaking of fluid from the vagina (birth canal). If leaking membranes are suspected, take your temperature and inform your caregiver of this when you call.   There is vaginal spotting or bleeding. Notify your caregiver of the amount and how many pads are used.   You develop a bad smelling vaginal discharge with a change in the color.   You continue to feel sick to your stomach (nauseated) and have no relief from remedies suggested. You vomit blood or coffee ground-like materials.   You lose more than 2 pounds of weight in 1 week.   You gain more than 2 pounds of weight in 1 week and you notice swelling of your face, hands, feet, or legs.   You gain 5 pounds or more in 1 week (even if you do not have swelling of your hands, face, legs, or feet).   You get exposed to German measles and have never had them.   You are exposed to fifth disease or chickenpox.   You develop belly (abdominal) pain. Round ligament discomfort is a common non-cancerous (benign) cause of abdominal pain in pregnancy. Your caregiver still must evaluate this.   You develop headache, fever, diarrhea, pain with urination, or shortness of breath.   You fall or are in a car accident or have any kind of trauma.   There is mental or physical violence in your home.  Document Released: 08/24/2001 Document Revised: 08/19/2011  Document Reviewed: 02/25/2009  [employer] Patient Information 2012 [employer].

## 2011-11-24 NOTE — Progress Notes (Signed)
   Subjective:    [name] [name] is a [id number] [redacted]w[redacted]d being seen today for her first obstetrical visit.  Her obstetrical history is significant for advanced maternal age. Patient does intend to breast feed. Pregnancy history fully reviewed.  Patient reports backache, nausea and vomiting.  Filed Vitals:   11/24/11 0949  BP: 115/74  Temp: 97.8 F (36.6 C)    HISTORY: OB History    Grav Para Term Preterm Abortions TAB SAB Ect Mult Living   3 2 2       2      # Outc Date GA Lbr Len/2nd Wgt Sex Del Anes PTL Lv   1 TRM 9/91 [redacted]w[redacted]d  5lb10oz(2.551kg) F CS Spinal  Yes   Comments: had pre-elcampsia   2 TRM 4/99 [redacted]w[redacted]d  7lb1oz(3.204kg) M SVD EPI  Yes   3 CUR              Past Medical History  Diagnosis Date  . Anxiety   . Depression   . Hyperlipidemia   . Cystitis 07/2008    under care of [facility]  . S/P dilatation of esophageal stricture 2006-2008  . Plantar fasciitis     s/p left gastroc slide   . Pain, dental 2010  . Macular degeneration 2013   Past Surgical History  Procedure Date  . Pancystourethroscopy   . Dilatation of esophageal stricture 2005-2008  . Gastroc slide 2005    left side   . Cesarean section     1991   Family History  Problem Relation Age of Onset  . Diabetes Mother   . Hyperlipidemia Mother      Exam    Uterine Size: [redacted]wk gestational size  Pelvic Exam:    Perineum: No Hemorrhoids, Normal Perineum   Vulva: normal   Vagina:  normal mucosa, normal discharge   Cervix: no bleeding following Pap, no cervical motion tenderness and no lesions   Adnexa: normal adnexa   Bony Pelvis: average  System:     Skin: normal coloration and turgor, no rashes    Neurologic: oriented, normal, normal mood, no focal deficits   Extremities: normal strength, tone, and muscle mass   HEENT PERRLA and extra ocular movement intact   Mouth/Teeth mucous membranes moist, pharynx normal without lesions   Neck supple and no masses   Cardiovascular: regular rate and  rhythm, no murmurs or gallops   Respiratory:  appears well, vitals normal, no respiratory distress, acyanotic, normal RR, ear and throat exam is normal, neck free of mass or lymphadenopathy, chest clear, no wheezing, crepitations, rhonchi, normal symmetric air entry   Abdomen: soft, non-tender; bowel sounds normal; no masses,  no organomegaly   Urinary: urethral meatus normal      Assessment:    Pregnancy: [id number] Patient Active Problem List  Diagnoses  . ANXIETY  . TOBACCO ABUSE  . DEPRESSION  . TMJ PAIN  . ESOPHAGEAL STRICTURE  . GERD  . CYSTITIS  . BACK PAIN  . LEG PAIN, BILATERAL  . INSOMNIA  . BURN OF UNSPECIFIED DEGREE OF UPPER ARM  . Recurrent UTI        Plan:     Initial labs drawn. Prenatal vitamins. Problem list reviewed and updated. Genetic Screening discussed First Screen: requested.  Ultrasound discussed; fetal survey: requested.  Follow up in 4 weeks. 50% of 45 min visit spent on counseling and coordination of care.     [name] [name] 11/24/2011

## 2011-11-24 NOTE — Progress Notes (Signed)
Pt c/o "cramping".  Edema- 92

## 2011-11-25 LAB — OBSTETRIC PANEL
Eosinophils Absolute: 0.1 10*3/uL (ref 0.0–0.7)
Hemoglobin: 13.9 g/dL (ref 12.0–15.0)
Hepatitis B Surface Ag: NEGATIVE
Lymphocytes Relative: 27 % (ref 12–46)
Lymphs Abs: 2.1 10*3/uL (ref 0.7–4.0)
MCH: 30.2 pg (ref 26.0–34.0)
Monocytes Relative: 7 % (ref 3–12)
Neutro Abs: 5 10*3/uL (ref 1.7–7.7)
Neutrophils Relative %: 64 % (ref 43–77)
Platelets: 283 10*3/uL (ref 150–400)
RBC: 4.61 MIL/uL (ref 3.87–5.11)
Rh Type: POSITIVE
WBC: 7.8 10*3/uL (ref 4.0–10.5)

## 2011-11-26 ENCOUNTER — Other Ambulatory Visit: Payer: Self-pay | Admitting: Family Medicine

## 2011-11-26 ENCOUNTER — Telehealth: Payer: Self-pay

## 2011-11-26 MED ORDER — SULFAMETHOXAZOLE-TMP DS 800-160 MG PO TABS: 1.0000 | ORAL_TABLET | Freq: Two times a day (BID) | ORAL | Status: AC

## 2011-11-26 NOTE — Telephone Encounter (Signed)
Message copied by Faythe Casa on Fri Nov 26, 2011 11:18 AM ------      Message from: Levie Heritage      Created: Fri Nov 26, 2011 11:06 AM       + urine culture.  Prescribed bactrim DS bid x 7 days

## 2011-11-26 NOTE — Telephone Encounter (Signed)
Called pt and and informed her of her + urine culture and that the antibiotic Bactrium has been sent to pharmacy in which I verified with her.  Pt had no further questions and stated understanding.

## 2011-11-26 NOTE — Telephone Encounter (Signed)
Message copied by Faythe Casa on Fri Nov 26, 2011 11:19 AM ------      Message from: Levie Heritage      Created: Fri Nov 26, 2011 11:06 AM       + urine culture.  Prescribed bactrim DS bid x 7 days

## 2011-11-27 LAB — CULTURE, OB URINE: Colony Count: >=100000

## 2011-12-01 ENCOUNTER — Telehealth: Payer: Self-pay | Admitting: *Deleted

## 2011-12-01 MED ORDER — PROMETHAZINE HCL 12.5 MG PO TABS
12.5000 mg | ORAL_TABLET | Freq: Four times a day (QID) | ORAL | Status: AC | PRN
Start: 2011-12-01 — End: 2011-12-08

## 2011-12-01 NOTE — Telephone Encounter (Signed)
Spoke w/pt after consult with Dr. Jolayne Panther. Pt was told that she may take the Norco according to original RX- which pt states is 3x daily as needed. Pt was also told that Rx for phenergan will be sent to her pharmacy for the nausea. Pt advised to keep crackers and ginger ale @ her bedside- to eat prior to getting out of bed in the morning. Pt also advised to try taking prenatal vitamin @ night or may skip some doses on days of severe nausea. Pt voiced understanding of all instructions.

## 2011-12-01 NOTE — Telephone Encounter (Signed)
Pt wants to know if we can call her in something for her morning sickness and nausea. She also wanted to know if its safe for her to take Norco 10/325 for her bulging disc in her back. If it is safe how many can she take a day?

## 2011-12-14 ENCOUNTER — Telehealth: Payer: Self-pay | Admitting: *Deleted

## 2011-12-14 MED ORDER — CEPHALEXIN 500 MG PO CAPS: 500.0000 mg | ORAL_CAPSULE | Freq: Three times a day (TID) | ORAL | Status: AC

## 2011-12-14 NOTE — Telephone Encounter (Signed)
Pt left message stating that she took the prescribed antibiotic for UTI and is still having the same sx. She is concerned that she has new UTI and is requesting new Rx. I reviewed chart and spoke w/Dr. Shawnie Pons.  Rx obtained for Keflex.  Pt notified of new Rx. I questioned pt as to whether or not she has previously taken Keflex since she has a penicillin allergy. She stated that she believes she has taken Keflex without problems in the past. I advised her to stop taking the medication of she develops sx of allergy and to notify us. Pt voiced understanding. She requested liquid if possible because she has an esphageal stricture and has difficulty swallowing pills.

## 2011-12-22 ENCOUNTER — Encounter: Payer: Self-pay | Admitting: *Deleted

## 2011-12-22 ENCOUNTER — Ambulatory Visit (HOSPITAL_COMMUNITY)
Admission: RE | Admit: 2011-12-22 | Discharge: 2011-12-22 | Disposition: A | Discharge status: Home / Self Care | Payer: Self-pay | Source: Ambulatory Visit | Attending: Physician Assistant | Admitting: Physician Assistant

## 2011-12-22 ENCOUNTER — Ambulatory Visit (INDEPENDENT_AMBULATORY_CARE_PROVIDER_SITE_OTHER): Payer: Self-pay | Admitting: Physician Assistant

## 2011-12-22 VITALS — Temp 97.7°F | Wt 135.6 lb

## 2011-12-22 DIAGNOSIS — O3680X0 Pregnancy with inconclusive fetal viability, not applicable or unspecified: Secondary | ICD-10-CM | POA: Insufficient documentation

## 2011-12-22 DIAGNOSIS — Z3689 Encounter for other specified antenatal screening: Secondary | ICD-10-CM | POA: Insufficient documentation

## 2011-12-22 DIAGNOSIS — O09529 Supervision of elderly multigravida, unspecified trimester: Secondary | ICD-10-CM

## 2011-12-22 DIAGNOSIS — O219 Vomiting of pregnancy, unspecified: Secondary | ICD-10-CM

## 2011-12-22 LAB — POCT URINALYSIS DIP (DEVICE)
Bilirubin Urine: NEGATIVE
Glucose, UA: NEGATIVE mg/dL
Ketones, ur: NEGATIVE mg/dL
Protein, ur: NEGATIVE mg/dL
Urobilinogen, UA: 0.2 mg/dL (ref 0.0–1.0)

## 2011-12-22 MED ORDER — ONDANSETRON 8 MG PO TBDP: 8.0000 mg | ORAL_TABLET | Freq: Three times a day (TID) | ORAL | Status: AC | PRN

## 2011-12-22 NOTE — Patient Instructions (Signed)
Miscarriage  An early pregnancy loss or spontaneous abortion (miscarriage) is a common problem. This usually happens when the pregnancy is not developing normally. It is very unlikely that you or your partner did anything to cause this, although cigarette smoking, a sexually transmitted disease, excessive alcohol use, or drug abuse can increase the risk. Other causes are:   Abnormalities of the uterus.   Hormone or medical problems.   Trauma or genetic (chromosome) problems.  Having a miscarriage does not change your chances of having a normal pregnancy in the future. Your caregiver will advise you when it is safe to try to get pregnant again.  AFTER A MISCARRIAGE   A miscarriage is inevitable when there is continual, heavy vaginal bleeding; cramping; dilation of the cervix; or passing of any pregnancy tissue. Bleeding and cramping will usually continue until all the tissue has been removed from the womb (uterus).   Often the uterus does not clean itself out completely. A medication or a D&C procedure is needed to loosen or remove the pregnancy tissue from the uterus. A D&C scrapes or suctions the tissue out.   If you are RH negative, you may need to have Rh immune globulin to avoid Rh problems.   You may be given medication to fight an infection if the miscarriage was due to an infection.  HOME CARE INSTRUCTIONS    You should rest in bed for the next 2 to 3 days.   Do not take tub baths or put anything in your vagina, including tampons or a douche.   Do not have sex until your caregiver approves.   Avoid exercise or heavy activities until directed by your caregiver.   Save any vaginal discharge that looks like tissue. Ask your caregiver if he or she wants to inspect the discharge.   If you and your partner are having problems with guilt or grieving, talk to your caregiver or get counseling to help you understand and cope with your pregnancy loss.   Allow enough time to grieve before trying to get  pregnant again.  SEEK IMMEDIATE MEDICAL CARE IF:    You have persistent heavy bleeding or a bad smelling vaginal discharge.   You have continued abdominal or pelvic pain.   You have an oral temperature above 102 F (38.9 C), not controlled by medicine.   You have severe weakness, fainting, or keep throwing up (vomiting).   You develop chills.   You are experiencing domestic violence.  MAKE SURE YOU:    Understand these instructions.   Will watch your condition.   Will get help right away if you are not doing well or get worse.  Document Released: 10/07/2004 Document Revised: 08/19/2011 Document Reviewed: 08/22/2008  [employer] Patient Information 2012 [employer].

## 2011-12-22 NOTE — Progress Notes (Signed)
Pulse- 101 

## 2011-12-22 NOTE — Progress Notes (Signed)
Unable to ID fetal pole or cardiac activity today on [place]. Appears to be empty gest sac. ? Shadowing. Will send to radiology for formal US to confirm viability.

## 2011-12-31 ENCOUNTER — Telehealth: Payer: Self-pay | Admitting: *Deleted

## 2011-12-31 NOTE — Telephone Encounter (Signed)
Patient called and left a message she was here 12/22/11  And they thought she was 41w5day preganant , but they said on ultrasound there was nothing there and doctor said I was going thru a miscarriage . As of past Saturday she reports regular bleeding like a period and some intense cramping but no clots or anything else abnormal - has appt 29th but not sure of time.   Wants to know if this normal . Called patient after reviewing chart. Instructed patient to keep appt for 29th at 2:45 , but to come to [facility]U immediately if severe pain, heavy bleeding enough to saturate pad in an hour or less, or starts feeling week/dizzy.Patient states she is worried because other people told her stuff shouldn't stay in her that long.  Discussed that is why it is important to keep follow up appointment and to come to MAU before then if she has any of the symptoms we discussed. Also informed patient she can call during the week next week to see if we have any cancelations and we would be happy to see her sooner.

## 2012-01-04 ENCOUNTER — Ambulatory Visit (HOSPITAL_COMMUNITY): Payer: Medicaid Other

## 2012-01-10 ENCOUNTER — Ambulatory Visit (INDEPENDENT_AMBULATORY_CARE_PROVIDER_SITE_OTHER): Payer: Self-pay | Admitting: Physician Assistant

## 2012-01-10 ENCOUNTER — Encounter: Payer: Self-pay | Admitting: Physician Assistant

## 2012-01-10 VITALS — BP 114/66 | HR 90 | Temp 99.8°F | Ht 60.0 in | Wt 135.4 lb

## 2012-01-10 DIAGNOSIS — O021 Missed abortion: Secondary | ICD-10-CM

## 2012-01-10 NOTE — Patient Instructions (Signed)
Miscarriage  An early pregnancy loss or spontaneous abortion (miscarriage) is a common problem. This usually happens when the pregnancy is not developing normally. It is very unlikely that you or your partner did anything to cause this, although cigarette smoking, a sexually transmitted disease, excessive alcohol use, or drug abuse can increase the risk. Other causes are:   Abnormalities of the uterus.   Hormone or medical problems.   Trauma or genetic (chromosome) problems.  Having a miscarriage does not change your chances of having a normal pregnancy in the future. Your caregiver will advise you when it is safe to try to get pregnant again.  AFTER A MISCARRIAGE   A miscarriage is inevitable when there is continual, heavy vaginal bleeding; cramping; dilation of the cervix; or passing of any pregnancy tissue. Bleeding and cramping will usually continue until all the tissue has been removed from the womb (uterus).   Often the uterus does not clean itself out completely. A medication or a D&C procedure is needed to loosen or remove the pregnancy tissue from the uterus. A D&C scrapes or suctions the tissue out.   If you are RH negative, you may need to have Rh immune globulin to avoid Rh problems.   You may be given medication to fight an infection if the miscarriage was due to an infection.  HOME CARE INSTRUCTIONS    You should rest in bed for the next 2 to 3 days.   Do not take tub baths or put anything in your vagina, including tampons or a douche.   Do not have sex until your caregiver approves.   Avoid exercise or heavy activities until directed by your caregiver.   Save any vaginal discharge that looks like tissue. Ask your caregiver if he or she wants to inspect the discharge.   If you and your partner are having problems with guilt or grieving, talk to your caregiver or get counseling to help you understand and cope with your pregnancy loss.   Allow enough time to grieve before trying to get  pregnant again.  SEEK IMMEDIATE MEDICAL CARE IF:    You have persistent heavy bleeding or a bad smelling vaginal discharge.   You have continued abdominal or pelvic pain.   You have an oral temperature above 102 F (38.9 C), not controlled by medicine.   You have severe weakness, fainting, or keep throwing up (vomiting).   You develop chills.   You are experiencing domestic violence.  MAKE SURE YOU:    Understand these instructions.   Will watch your condition.   Will get help right away if you are not doing well or get worse.  Document Released: 10/07/2004 Document Revised: 08/19/2011 Document Reviewed: 08/22/2008  [employer] Patient Information 2012 [employer].

## 2012-01-11 LAB — HCG, QUANTITATIVE, PREGNANCY: hCG, Beta Chain, Quant, S: 1851.7 m[IU]/mL

## 2012-01-11 LAB — CBC
HCT: 38.7 % (ref 36.0–46.0)
MCHC: 34.4 g/dL (ref 30.0–36.0)
RDW: 12.6 % (ref 11.5–15.5)

## 2012-01-14 ENCOUNTER — Ambulatory Visit (HOSPITAL_COMMUNITY)
Admission: RE | Admit: 2012-01-14 | Discharge: 2012-01-14 | Disposition: A | Discharge status: Home / Self Care | Payer: Medicaid Other | Source: Ambulatory Visit | Attending: Physician Assistant | Admitting: Physician Assistant

## 2012-01-14 ENCOUNTER — Other Ambulatory Visit: Payer: Self-pay | Admitting: Physician Assistant

## 2012-01-14 DIAGNOSIS — O021 Missed abortion: Secondary | ICD-10-CM

## 2012-01-17 ENCOUNTER — Ambulatory Visit (INDEPENDENT_AMBULATORY_CARE_PROVIDER_SITE_OTHER): Payer: Self-pay | Admitting: Obstetrics & Gynecology

## 2012-01-17 ENCOUNTER — Encounter: Payer: Self-pay | Admitting: Obstetrics & Gynecology

## 2012-01-17 ENCOUNTER — Telehealth: Payer: Self-pay | Admitting: *Deleted

## 2012-01-17 VITALS — BP 105/72 | HR 85 | Temp 99.2°F | Ht 60.0 in | Wt 135.6 lb

## 2012-01-17 DIAGNOSIS — O021 Missed abortion: Secondary | ICD-10-CM

## 2012-01-17 NOTE — Progress Notes (Signed)
Patient states she's been having a brownish discharge with odor for the past couple weeks, isn't sure if it's from miscarriage or not- says this is not normal for her.

## 2012-01-17 NOTE — Patient Instructions (Signed)
Dilation and Curettage or Vacuum Curettage Dilation and curettage (D&C) and vacuum curettage are minor procedures. A D&C involves stretching (dilation) the cervix and scraping (curettage) the inside lining of the womb (uterus). During a D&C, tissue is gently scraped from the inside lining of the uterus. During a vacuum curettage, the lining and tissue in the uterus are removed with the use of gentle suction. Curettage may be performed for diagnostic or therapeutic purposes. As a diagnostic procedure, curettage is performed for the purpose of examining tissues from the uterus. Tissue examination may help determine causes or treatment options for symptoms. A diagnostic curettage may be performed for the following symptoms:  Irregular bleeding in the uterus.   Bleeding with the development of clots.   Spotting between menstrual periods.   Prolonged menstrual periods.   Bleeding after menopause.   No menstrual period (amenorrhea).   A change in size and shape of the uterus.  A therapeutic curettage is performed to remove tissue, blood, or a contraceptive device. Therapeutic curettage may be performed for the following conditions:   Removal of an IUD (intrauterine device).   Removal of retained placenta after giving birth. Retained placenta can cause bleeding severe enough to require transfusions or an infection.   Abortion.   Miscarriage.   Removal of polyps inside the uterus.   Removal of uncommon types of fibroids (noncancerous lumps).  LET YOUR CAREGIVER KNOW ABOUT:   Allergies to food or medicine.   Medicines taken, including vitamins, herbs, eyedrops, over-the-counter medicines, and creams.   Use of steroids (by mouth or creams).   Previous problems with anesthetics or numbing medicines.   History of bleeding problems or blood clots.   Previous surgery.   Other health problems, including diabetes and kidney problems.   Possibility of pregnancy, if this applies.  RISKS  AND COMPLICATIONS   Excessive bleeding.   Infection of the uterus.   Damage to the cervix.   Development of scar tissue (adhesions) inside the uterus, later causing abnormal amounts of menstrual bleeding.   Complications from the general anesthetic, if a general anesthetic is used.   Putting a hole (perforation) in the uterus. This is rare.  BEFORE THE PROCEDURE   Eat and drink before the procedure only as directed by your caregiver.   Arrange for someone to take you home.  PROCEDURE   This procedure may be done in a hospital, outpatient clinic, or caregiver's office.   You may be given a general anesthetic or a local anesthetic in and around the cervix.   You will lie on your back with your legs in stirrups.   There are two ways in which your cervix can be softened and dilated. These include:   Taking a medicine.   Having thin rods (laminaria) inserted into your cervix.   A curved tool (curette) will scrape cells from the inside lining of the uterus and will then be removed.  This procedure usually takes about 15 to 30 minutes. AFTER THE PROCEDURE   You will rest in the recovery area until you are stable and are ready to go home.   You will need to have someone take you home.   You may feel sick to your stomach (nauseous) or throw up (vomit) if you had general anesthesia.   You may have a sore throat if a tube was placed in your throat during general anesthesia.   You may have light cramping and bleeding for 2 days to 2 weeks after the procedure.     Your uterus needs to make a new lining after the procedure. This may make your next period late.  Document Released: 08/30/2005 Document Revised: 08/19/2011 Document Reviewed: 03/28/2009 [employer] Patient Information 2012 [employer]. 

## 2012-01-17 NOTE — Telephone Encounter (Signed)
[name] [name] called and left a message 01/14/12 at 3:30 stating she came in on Monday- states I've been told over a month ago my pregnancy stopped and that I was going to miscarry - I came in on Monday and saw doctor , did  A cbc and a hormone level- I haven't heard anything- I had an ultrasound today and they didn't tell me anything except come back Monday- please call me and tell me what is going on. Called [facility] and expressed to her I understand this has been a roller coaster, explained that ultrasound can not give her the results - that that is what her appointment today is for- needs to discuss with doctor face to face the results of blood work and ultrasound and plan , if any. Patient states she understands and will keep appointment today.

## 2012-01-17 NOTE — Progress Notes (Signed)
History:  41 y.o. [id number] here today for followup evaluation of missed abortion.  She was diagnosed with missed abortion on 12/22/2011  (CRL indicated [redacted] week GA, MSD indicated [redacted] week GA, LMP indicated [redacted]w[redacted]d GA) and has been expectantly managed.  She had some bleeding but did not pass any tissue.  Repeat scan on 01/14/12 showed IUGS, absent fetal pole likely secondary to resorption.  She comes in today very tearful, wants to know what the next step in management is.  She reports occasional lower pelvic pain, no further bleeding.  No fevers, chills or other systemic symptoms.  The following portions of the patient's history were reviewed and updated as appropriate: allergies, current medications, past family history, past medical history, past social history, past surgical history and problem list.  Review of Systems:  Pertinent items are noted in HPI.  Objective:  Physical Exam Blood pressure 105/72, pulse 85, temperature 99.2 F (37.3 C), height 5' (1.524 m), weight 135 lb 9.6 oz (61.508 kg), last menstrual period 10/08/2011. Gen: NAD Abd: Soft, nontender and nondistended Pelvic: Normal appearing external genitalia/  Small uterus, no palpable masses or adnexal tenderness.  Assessment & Plan:  Discussed further management of missed abortion given failed expectant management: misoprostol vs D&E.  Risks and benefits of all modalities discussed; all questions answered.  Patient opted for D&E under ultrasound guidance to help ensure all products are removed.  Risks of surgery including bleeding, infection, injury to surrounding organs, need for additional procedures, possibility of intrauterine scarring which may impair future fertility, risk of retained products which may require further management and other postoperative/anesthesia complications were explained to patient.  Likelihood of success of complete evacuation of the uterus was discussed with the patient.  All questions were answered.  She was told  that she will be contacted by our surgical scheduler regarding the time and date of her surgery; routine preoperative instructions were also emphasized.  Printed patient education handouts about the procedure was given to the patient to review at home. Bleeding precautions reviewed; she was told to call clinic or come to MAU for any concerns.

## 2012-01-19 ENCOUNTER — Other Ambulatory Visit: Payer: Self-pay | Admitting: Obstetrics & Gynecology

## 2012-01-19 DIAGNOSIS — O021 Missed abortion: Secondary | ICD-10-CM

## 2012-01-23 NOTE — Progress Notes (Signed)
Chief Complaint:  Miscarriage   [name] [name] is  41 y.o. [id number].  Patient's last menstrual period was 10/08/2011..  She presents complaining of Miscarriage  Pt presents for FU after missed AB managed expectantly. Reports initial period-like bleeding then nothing since. Emotionally doing better.  Obstetrical/Gynecological History: OB History    Grav Para Term Preterm Abortions TAB SAB Ect Mult Living   3 2 2       2       Past Medical History: Past Medical History  Diagnosis Date  . Anxiety   . Depression   . Hyperlipidemia   . Cystitis 07/2008    under care of [facility]  . S/P dilatation of esophageal stricture 2006-2008  . Plantar fasciitis     s/p left gastroc slide   . Pain, dental 2010  . Macular degeneration 2013    Past Surgical History: Past Surgical History  Procedure Date  . Pancystourethroscopy   . Dilatation of esophageal stricture 2005-2008  . Gastroc slide 2005    left side   . Cesarean section     1991    Family History: Family History  Problem Relation Age of Onset  . Diabetes Mother   . Hyperlipidemia Mother     Social History: History  Substance Use Topics  . Smoking status: Current Everyday Smoker -- 0.2 packs/day for 10 years    Types: Cigarettes  . Smokeless tobacco: Never Used  . Alcohol Use: No    Allergies:  Allergies  Allergen Reactions  . Azithromycin     REACTION: stomach pain  . Penicillins     REACTION: Rash/ Nausea     (Not in a hospital admission)  Review of Systems - History obtained from the patient General ROS: negative for - chills, fatigue, fever or malaise Respiratory ROS: no cough, shortness of breath, or wheezing Cardiovascular ROS: no chest pain or dyspnea on exertion Gastrointestinal ROS: no abdominal pain, change in bowel habits, or black or bloody stools Genito-Urinary ROS: no dysuria, trouble voiding, or hematuria  Physical Exam   Blood pressure 114/66, pulse 90, temperature 99.8 F (37.7  C), height 5' (1.524 m), weight 135 lb 6.4 oz (61.417 kg), last menstrual period 10/08/2011, unknown if currently breastfeeding.  General: General appearance - alert, well appearing, and in no distress and oriented to person, place, and time Mental status - alert, oriented to person, place, and time, normal mood, behavior, speech, dress, motor activity, and thought processes, affect appropriate to mood Abdomen - soft, nontender, nondistended, no masses or organomegaly Focused Gynecological Exam: examination not indicated     Assessment: 1. Missed abortions  US Transvaginal Non-OB, B-HCG Quant, CBC     Plan: Plan Cytotec if Quant not falling and [place] continues to indicated POC  Tamre Cass E.

## 2012-01-24 ENCOUNTER — Telehealth: Payer: Self-pay | Admitting: *Deleted

## 2012-01-24 NOTE — Telephone Encounter (Signed)
Patient already discussed with Dr. Macon Large already and is scheduled for D & C.

## 2012-01-24 NOTE — Telephone Encounter (Signed)
Message copied by Mannie Stabile on Mon Jan 24, 2012  3:02 PM ------      Message from: August Luz      Created: Fri Jan 21, 2012  8:31 AM       Please call patient. With [place] results. Would recommend cytotec as we discussed at last visit.

## 2012-01-25 ENCOUNTER — Encounter (HOSPITAL_COMMUNITY): Payer: Self-pay | Admitting: *Deleted

## 2012-02-02 ENCOUNTER — Ambulatory Visit: Payer: Self-pay | Admitting: Advanced Practice Midwife

## 2012-02-03 ENCOUNTER — Encounter (HOSPITAL_COMMUNITY): Payer: Self-pay | Admitting: Pharmacist

## 2012-02-08 MED ORDER — DEXTROSE 5 % IV SOLN
200.0000 mg | INTRAVENOUS | Status: AC
  Administered 2012-02-09: 200 mg via INTRAVENOUS
  Filled 2012-02-08: qty 200

## 2012-02-09 ENCOUNTER — Ambulatory Visit (HOSPITAL_COMMUNITY): Payer: Medicaid Other | Admitting: Anesthesiology

## 2012-02-09 ENCOUNTER — Encounter (HOSPITAL_COMMUNITY): Payer: Self-pay | Admitting: Anesthesiology

## 2012-02-09 ENCOUNTER — Ambulatory Visit (HOSPITAL_COMMUNITY): Payer: Medicaid Other

## 2012-02-09 ENCOUNTER — Encounter (HOSPITAL_COMMUNITY)
Admission: RE | Disposition: A | Discharge status: Home / Self Care | Payer: Self-pay | Source: Ambulatory Visit | Attending: Obstetrics & Gynecology

## 2012-02-09 ENCOUNTER — Ambulatory Visit (HOSPITAL_COMMUNITY)
Admission: RE | Admit: 2012-02-09 | Discharge: 2012-02-09 | Disposition: A | Discharge status: Home / Self Care | Payer: Medicaid Other | Source: Ambulatory Visit | Attending: Obstetrics & Gynecology | Admitting: Obstetrics & Gynecology

## 2012-02-09 DIAGNOSIS — O021 Missed abortion: Secondary | ICD-10-CM | POA: Diagnosis present

## 2012-02-09 HISTORY — DX: Unspecified osteoarthritis, unspecified site: M19.90

## 2012-02-09 HISTORY — DX: Inflammatory liver disease, unspecified: K75.9

## 2012-02-09 HISTORY — DX: Personal history of other diseases of the digestive system: Z87.19

## 2012-02-09 HISTORY — PX: DILATION AND EVACUATION: SHX1459

## 2012-02-09 LAB — CBC
HCT: 38.6 % (ref 36.0–46.0)
MCH: 30.3 pg (ref 26.0–34.0)
MCHC: 33.9 g/dL (ref 30.0–36.0)
MCV: 89.4 fL (ref 78.0–100.0)
RDW: 12.8 % (ref 11.5–15.5)

## 2012-02-09 SURGERY — DILATION AND EVACUATION, UTERUS
Anesthesia: General | Site: Vagina | Wound class: Clean Contaminated

## 2012-02-09 MED ORDER — ONDANSETRON HCL 4 MG/2ML IJ SOLN
INTRAMUSCULAR | Status: DC | PRN
  Administered 2012-02-09: 4 mg via INTRAVENOUS

## 2012-02-09 MED ORDER — LACTATED RINGERS IV SOLN: INTRAVENOUS | Status: DC

## 2012-02-09 MED ORDER — DOCUSATE SODIUM 100 MG PO CAPS: 100.0000 mg | ORAL_CAPSULE | Freq: Two times a day (BID) | ORAL | Status: AC | PRN

## 2012-02-09 MED ORDER — MIDAZOLAM HCL 2 MG/2ML IJ SOLN
INTRAMUSCULAR | Status: AC
  Filled 2012-02-09: qty 2

## 2012-02-09 MED ORDER — FENTANYL CITRATE 0.05 MG/ML IJ SOLN
INTRAMUSCULAR | Status: AC
  Filled 2012-02-09: qty 2

## 2012-02-09 MED ORDER — KETOROLAC TROMETHAMINE 60 MG/2ML IM SOLN
INTRAMUSCULAR | Status: DC | PRN
  Administered 2012-02-09: 30 mg via INTRAMUSCULAR

## 2012-02-09 MED ORDER — KETOROLAC TROMETHAMINE 60 MG/2ML IM SOLN
INTRAMUSCULAR | Status: AC
  Filled 2012-02-09: qty 2

## 2012-02-09 MED ORDER — LIDOCAINE HCL (CARDIAC) 20 MG/ML IV SOLN
INTRAVENOUS | Status: AC
  Filled 2012-02-09: qty 5

## 2012-02-09 MED ORDER — GLYCOPYRROLATE 0.2 MG/ML IJ SOLN
INTRAMUSCULAR | Status: DC | PRN
  Administered 2012-02-09: 0.3 mg via INTRAVENOUS

## 2012-02-09 MED ORDER — FENTANYL CITRATE 0.05 MG/ML IJ SOLN
INTRAMUSCULAR | Status: AC
  Administered 2012-02-09: 50 ug via INTRAVENOUS
  Filled 2012-02-09: qty 2

## 2012-02-09 MED ORDER — FENTANYL CITRATE 0.05 MG/ML IJ SOLN
INTRAMUSCULAR | Status: DC | PRN
  Administered 2012-02-09: 100 ug via INTRAVENOUS
  Administered 2012-02-09: 50 ug via INTRAVENOUS

## 2012-02-09 MED ORDER — DEXAMETHASONE SODIUM PHOSPHATE 4 MG/ML IJ SOLN
INTRAMUSCULAR | Status: DC | PRN
  Administered 2012-02-09: 10 mg via INTRAVENOUS

## 2012-02-09 MED ORDER — PROPOFOL 10 MG/ML IV EMUL
INTRAVENOUS | Status: AC
  Filled 2012-02-09: qty 20

## 2012-02-09 MED ORDER — GLYCOPYRROLATE 0.2 MG/ML IJ SOLN
INTRAMUSCULAR | Status: AC
  Filled 2012-02-09: qty 1

## 2012-02-09 MED ORDER — PROPOFOL 10 MG/ML IV EMUL
INTRAVENOUS | Status: DC | PRN
  Administered 2012-02-09: 200 mg via INTRAVENOUS

## 2012-02-09 MED ORDER — FENTANYL CITRATE 0.05 MG/ML IJ SOLN
INTRAMUSCULAR | Status: AC
  Filled 2012-02-09: qty 5

## 2012-02-09 MED ORDER — IBUPROFEN 600 MG PO TABS: 600.0000 mg | ORAL_TABLET | Freq: Four times a day (QID) | ORAL | Status: AC | PRN

## 2012-02-09 MED ORDER — KETOROLAC TROMETHAMINE 15 MG/ML IJ SOLN
INTRAMUSCULAR | Status: DC | PRN
  Administered 2012-02-09: 30 mg via INTRAVENOUS

## 2012-02-09 MED ORDER — MIDAZOLAM HCL 5 MG/ML IJ SOLN
INTRAMUSCULAR | Status: DC | PRN
  Administered 2012-02-09: 2 mg via INTRAVENOUS

## 2012-02-09 MED ORDER — BUPIVACAINE-EPINEPHRINE (PF) 0.5% -1:200000 IJ SOLN
INTRAMUSCULAR | Status: AC
  Filled 2012-02-09: qty 10

## 2012-02-09 MED ORDER — OXYCODONE-ACETAMINOPHEN 5-325 MG PO TABS: 1.0000 | ORAL_TABLET | Freq: Four times a day (QID) | ORAL | Status: AC | PRN

## 2012-02-09 MED ORDER — FENTANYL CITRATE 0.05 MG/ML IJ SOLN
25.0000 ug | INTRAMUSCULAR | Status: DC | PRN
  Administered 2012-02-09 (×3): 50 ug via INTRAVENOUS

## 2012-02-09 MED ORDER — MEPERIDINE HCL 25 MG/ML IJ SOLN: 6.2500 mg | INTRAMUSCULAR | Status: DC | PRN

## 2012-02-09 MED ORDER — METOCLOPRAMIDE HCL 5 MG/ML IJ SOLN: 10.0000 mg | Freq: Once | INTRAMUSCULAR | Status: DC | PRN

## 2012-02-09 MED ORDER — ONDANSETRON HCL 4 MG/2ML IJ SOLN
INTRAMUSCULAR | Status: AC
  Filled 2012-02-09: qty 2

## 2012-02-09 MED ORDER — HYDROMORPHONE HCL PF 1 MG/ML IJ SOLN
INTRAMUSCULAR | Status: AC
  Filled 2012-02-09: qty 1

## 2012-02-09 MED ORDER — LIDOCAINE HCL (CARDIAC) 20 MG/ML IV SOLN
INTRAVENOUS | Status: DC | PRN
  Administered 2012-02-09: 50 mg via INTRAVENOUS

## 2012-02-09 MED ORDER — LACTATED RINGERS IV SOLN
INTRAVENOUS | Status: DC
  Administered 2012-02-09: 09:00:00 via INTRAVENOUS

## 2012-02-09 MED ORDER — BUPIVACAINE-EPINEPHRINE 0.5% -1:200000 IJ SOLN
INTRAMUSCULAR | Status: DC | PRN
  Administered 2012-02-09: 10 mL

## 2012-02-09 SURGICAL SUPPLY — 15 items
CATH ROBINSON RED A/P 16FR (CATHETERS) ×2 IMPLANT
CLOTH BEACON ORANGE TIMEOUT ST (SAFETY) ×2 IMPLANT
DECANTER SPIKE VIAL GLASS SM (MISCELLANEOUS) ×2 IMPLANT
GLOVE BIO SURGEON STRL SZ7 (GLOVE) ×2 IMPLANT
GLOVE BIOGEL PI IND STRL 7.0 (GLOVE) ×2 IMPLANT
GLOVE BIOGEL PI INDICATOR 7.0 (GLOVE) ×2
KIT BERKELEY 1ST TRIMESTER 3/8 (MISCELLANEOUS) ×2 IMPLANT
NDL SPNL 22GX3.5 QUINCKE BK (NEEDLE) ×1 IMPLANT
NEEDLE SPNL 22GX3.5 QUINCKE BK (NEEDLE) ×2 IMPLANT
NS IRRIG 1000ML POUR BTL (IV SOLUTION) ×2 IMPLANT
PACK VAGINAL MINOR WOMEN LF (CUSTOM PROCEDURE TRAY) ×2 IMPLANT
PAD PREP 24X48 CUFFED NSTRL (MISCELLANEOUS) ×2 IMPLANT
SET BERKELEY SUCTION TUBING (SUCTIONS) ×2 IMPLANT
SYR CONTROL 10ML LL (SYRINGE) ×2 IMPLANT
VACURETTE 9 RIGID CVD (CANNULA) ×1 IMPLANT

## 2012-02-09 NOTE — Transfer of Care (Signed)
Immediate Anesthesia Transfer of Care Note  Patient: [name] [name]  Procedure(s) Performed: Procedure(s) (LRB): DILATATION AND EVACUATION (N/A)  Patient Location: PACU  Anesthesia Type: General  Level of Consciousness: awake, alert  and oriented  Airway & Oxygen Therapy: Patient Spontanous Breathing and Patient connected to nasal cannula oxygen  Post-op Assessment: Report given to PACU RN and Post -op Vital signs reviewed and stable  Post vital signs: Reviewed and stable  Complications: No apparent anesthesia complications

## 2012-02-09 NOTE — H&P (View-Only) (Signed)
History:  41 y.o. G3P2002 here today for followup evaluation of missed abortion.  She was diagnosed with missed abortion on 12/22/2011  (CRL indicated [redacted] week GA, MSD indicated [redacted] week GA, LMP indicated [redacted]w[redacted]d GA) and has been expectantly managed.  She had some bleeding but did not pass any tissue.  Repeat scan on 01/14/12 showed IUGS, absent fetal pole likely secondary to resorption.  She comes in today very tearful, wants to know what the next step in management is.  She reports occasional lower pelvic pain, no further bleeding.  No fevers, chills or other systemic symptoms.  The following portions of the patient's history were reviewed and updated as appropriate: allergies, current medications, past family history, past medical history, past social history, past surgical history and problem list.  Review of Systems:  Pertinent items are noted in HPI.  Objective:  Physical Exam Blood pressure 105/72, pulse 85, temperature 99.2 F (37.3 C), height 5' (1.524 m), weight 135 lb 9.6 oz (61.508 kg), last menstrual period 10/08/2011. Gen: NAD Abd: Soft, nontender and nondistended Pelvic: Normal appearing external genitalia/  Small uterus, no palpable masses or adnexal tenderness.  Assessment & Plan:  Discussed further management of missed abortion given failed expectant management: misoprostol vs D&E.  Risks and benefits of all modalities discussed; all questions answered.  Patient opted for D&E under ultrasound guidance to help ensure all products are removed.  Risks of surgery including bleeding, infection, injury to surrounding organs, need for additional procedures, possibility of intrauterine scarring which may impair future fertility, risk of retained products which may require further management and other postoperative/anesthesia complications were explained to patient.  Likelihood of success of complete evacuation of the uterus was discussed with the patient.  All questions were answered.  She was told  that she will be contacted by our surgical scheduler regarding the time and date of her surgery; routine preoperative instructions were also emphasized.  Printed patient education handouts about the procedure was given to the patient to review at home. Bleeding precautions reviewed; she was told to call clinic or come to MAU for any concerns.   

## 2012-02-09 NOTE — Discharge Instructions (Addendum)
Dilation and Curettage  Care After **No Ibuprofen containing medications until after 4:35pm today**  Follow up with Dr. Mont Dutton office on 03/15/2012 at 1:15pm  Refer to this sheet in the next few weeks. These instructions provide you with general information on caring for yourself after your procedure. Your caregiver may also give you more specific instructions. Your treatment has been planned according to current medical practices, but problems sometimes occur. Call your caregiver if you have any problems or questions after your procedure. HOME CARE INSTRUCTIONS   Do not drive for 24 hours.   Avoid long periods of standing, and do no heavy lifting (more than 10 pounds or 4.5 kg), pushing, or pulling for first few days.   Take rest periods often.   You may resume your usual diet.   Drink enough fluids to keep your urine clear or pale yellow.   You should return to your usual bowel function. If constipation should occur, you may:   Take a mild laxative with permission from your caregiver.   Add fruit and bran to your diet.   Drink more fluids.   Take showers instead of baths until your caregiver gives you permission to take baths.   Do not go swimming or use a hot tub until your caregiver gives you permission.   Try to have someone with you or available to you the first 24 to 48 hours, especially if you had a general anesthetic.   Do not douche, use tampons, or have intercourse for four weeks  Only take over-the-counter or prescription medicines for pain, discomfort, or fever as directed by your caregiver. Do not take aspirin. It can cause bleeding.   If a prescription was given, follow your caregiver's directions.   Keep all your follow-up appointments recommended by your caregiver.  SEEK MEDICAL CARE IF:   You have increasing cramps or pain not relieved with medicine.   You have abdominal pain which does not seem to be related to the same area of earlier cramping and  pain.   You have bad smelling vaginal discharge.   You have a rash.   You have problems with any medicine.  SEEK IMMEDIATE MEDICAL CARE IF:   You have bleeding that is heavier than a normal menstrual period.   You have a fever.   You have chest pain.   You have shortness of breath.   You feel dizzy or feel like fainting.   You pass out.   You have pain in your shoulder strap area.   You have heavy vaginal bleeding with or without blood clots.  MAKE SURE YOU:   Understand these instructions.   Will watch your condition.   Will get help right away if you are not doing well or get worse.  Document Released: 08/27/2000 Document Revised: 08/19/2011 Document Reviewed: 03/27/2009 [facility] Patient Information 2012 [place], [place].

## 2012-02-09 NOTE — Anesthesia Preprocedure Evaluation (Signed)
Anesthesia Evaluation  Patient identified by MRN, date of birth, ID band Patient awake    Reviewed: Allergy & Precautions, H&P , NPO status , Patient's Chart, lab work & pertinent test results  Airway Mallampati: III TM Distance: >3 FB Neck ROM: Full    Dental No notable dental hx. (+) Teeth Intact   Pulmonary Current Smoker,  20 pack years breath sounds clear to auscultation  Pulmonary exam normal       Cardiovascular negative cardio ROS  Rhythm:Regular Rate:Normal     Neuro/Psych PSYCHIATRIC DISORDERS Anxiety Depression negative neurological ROS     GI/Hepatic hiatal hernia, GERD-  Medicated and Controlled,(+) Hepatitis -, AHx/o Esophageal Stricture   Endo/Other  negative endocrine ROS  Renal/GU negative Renal ROS  negative genitourinary   Musculoskeletal negative musculoskeletal ROS (+)   Abdominal Normal abdominal exam  (+)  Abdomen: soft.    Peds  Hematology negative hematology ROS (+)   Anesthesia Other Findings   Reproductive/Obstetrics                           Anesthesia Physical Anesthesia Plan  ASA: II  Anesthesia Plan: General   Post-op Pain Management:    Induction: Intravenous  Airway Management Planned: LMA  Additional Equipment:   Intra-op Plan:   Post-operative Plan: Extubation in OR  Informed Consent: I have reviewed the patients History and Physical, chart, labs and discussed the procedure including the risks, benefits and alternatives for the proposed anesthesia with the patient or authorized representative who has indicated his/her understanding and acceptance.   Dental advisory given  Plan Discussed with: Anesthesiologist, CRNA and Surgeon  Anesthesia Plan Comments:         Anesthesia Quick Evaluation

## 2012-02-09 NOTE — Anesthesia Procedure Notes (Signed)
Procedure Name: LMA Insertion Date/Time: 02/09/2012 10:06 AM Performed by: Isabella Bowens R Pre-anesthesia Checklist: Patient identified, Emergency Drugs available, Suction available, Patient being monitored and Timeout performed Patient Re-evaluated:Patient Re-evaluated prior to inductionOxygen Delivery Method: Circle system utilized Preoxygenation: Pre-oxygenation with 100% oxygen Intubation Type: IV induction Ventilation: Mask ventilation without difficulty LMA: LMA inserted LMA Size: 4.0 Grade View: Grade III Number of attempts: 1 Placement Confirmation: positive ETCO2 and breath sounds checked- equal and bilateral

## 2012-02-09 NOTE — Interval H&P Note (Signed)
History and Physical Interval Note:  [name] [name]  has presented today for surgery, with the diagnosis of 9 week missed abortion  The various methods of treatment have been discussed with the patient and family. After consideration of risks, benefits and other options for treatment, the patient has consented to  Procedure: DILATATION AND EVACUATION under ultrasound guidance as a surgical intervention .  The patients' history has been reviewed, patient examined, no change in status, stable for surgery.  I have reviewed the patients' chart and labs.  Questions were answered to the patient's satisfaction.    Jaynie Collins, M.D. 02/09/2012 8:47 AM

## 2012-02-09 NOTE — Op Note (Signed)
[name] [name] PROCEDURE DATE: 02/09/2012  PREOPERATIVE DIAGNOSIS: 9 week missed abortion POSTOPERATIVE DIAGNOSIS: The same PROCEDURE:     Dilation and Evacuation under ultrasound guidance SURGEON:  Dr. Jaynie Collins  INDICATIONS: 41 y.o. G3P2002with MAB at [redacted] weeks gestation, who failed expectant management, declined medical management, and desired surgical completion under ultrasound guidance.  Risks of surgery were discussed with the patient including but not limited to: bleeding which may require transfusion; infection which may require antibiotics; injury to uterus or surrounding organs;need for additional procedures including laparotomy or laparoscopy; possibility of intrauterine scarring which may impair future fertility; and other postoperative/anesthesia complications. Written informed consent was obtained.    FINDINGS:  A 10 week size uterus, moderate amounts of products of conception, specimen sent to pathology. Uterus conformed to be empty after procedure with ultrasound images.  ANESTHESIA:    General anesthesia with LMA, paracervical block. INTRAVENOUS FLUIDS:  700 ml of LR ESTIMATED BLOOD LOSS:  50 ml. SPECIMENS:  Products of conception sent to pathology COMPLICATIONS:  None immediate.  PROCEDURE DETAILS:  The patient received intravenous Doxycycline while in the preoperative area.  She was then taken to the operating room where general anesthesia was administered and was found to be adequate.  After an adequate timeout was performed, she was placed in the dorsal lithotomy position and examined; then prepped and draped in the sterile manner.   Her bladder was catheterized for an unmeasured amount of clear, yellow urine. A vaginal speculum was then placed in the patient's vagina and a single tooth tenaculum was applied to the anterior lip of the cervix.  A paracervical block using 30 ml of 0.5% Marcaine with epinephrine was administered. The cervix was gently dilated to accommodate a 9  mm suction curette that was gently advanced to the uterine fundus under transabdominal ultrasound guidance.  The suction device was then activated and curette slowly rotated to clear the uterus of products of conception.  A sharp curettage was then performed to confirm complete emptying of the uterus; also confirmed on ultrasound imaging. There was minimal bleeding noted and the tenaculum removed with good hemostasis noted.   All instruments were removed from the patient's vagina. The patient tolerated the procedure well and was taken to the recovery area extubated, awake, and in stable condition.  The patient will be discharged to home as per PACU criteria.  Routine postoperative instructions given.  She was prescribed Percocet, Ibuprofen and Colace.  She will follow up in the clinic on 03/15/12 for postoperative evaluation.

## 2012-02-09 NOTE — Anesthesia Postprocedure Evaluation (Signed)
  Anesthesia Post-op Note  Patient: [name] [name]  Procedure(s) Performed: Procedure(s) (LRB): DILATATION AND EVACUATION (N/A)  Patient Location: PACU  Anesthesia Type: General  Level of Consciousness: awake, alert  and oriented  Airway and Oxygen Therapy: Patient Spontanous Breathing  Post-op Pain: none  Post-op Assessment: Post-op Vital signs reviewed, Patient's Cardiovascular Status Stable, Respiratory Function Stable, Patent Airway, No signs of Nausea or vomiting, Adequate PO intake and Pain level controlled  Post-op Vital Signs: Reviewed and stable  Complications: No apparent anesthesia complications

## 2012-02-10 ENCOUNTER — Encounter (HOSPITAL_COMMUNITY): Payer: Self-pay | Admitting: Obstetrics & Gynecology

## 2012-03-15 ENCOUNTER — Ambulatory Visit (INDEPENDENT_AMBULATORY_CARE_PROVIDER_SITE_OTHER): Payer: Self-pay | Admitting: Obstetrics & Gynecology

## 2012-03-15 ENCOUNTER — Encounter: Payer: Self-pay | Admitting: Obstetrics & Gynecology

## 2012-03-15 VITALS — BP 121/70 | HR 92 | Temp 99.4°F | Ht 60.0 in | Wt 130.7 lb

## 2012-03-15 DIAGNOSIS — Z09 Encounter for follow-up examination after completed treatment for conditions other than malignant neoplasm: Secondary | ICD-10-CM

## 2012-03-15 DIAGNOSIS — R300 Dysuria: Secondary | ICD-10-CM

## 2012-03-15 LAB — POCT URINALYSIS DIP (DEVICE)
Bilirubin Urine: NEGATIVE
Glucose, UA: NEGATIVE mg/dL
Nitrite: NEGATIVE
pH: 7 (ref 5.0–8.0)

## 2012-03-15 NOTE — Progress Notes (Signed)
  Subjective:     [name] [name] is a 42 y.o. G23P2002 female who presents to the clinic 4 weeks status post D&E for 9 week MAB. Eating a regular diet without difficulty. Bowel movements are normal. The patient is not having any pain. No contraception, has restarted sexual activity.  The following portions of the patient's history were reviewed and updated as appropriate: allergies, current medications, past family history, past medical history, past social history, past surgical history and problem list.  Review of Systems Pertinent items are noted in HPI.    Objective:    BP 121/70  Pulse 92  Temp 99.4 F (37.4 C) (Oral)  Ht 5' (1.524 m)  Wt 130 lb 11.2 oz (59.285 kg)  BMI 25.53 kg/m2  LMP 10/08/2011 General:  alert and no distress  Abdomen: soft, bowel sounds active, non-tender, no abnormal masses  Pelvic:   Normal exam, normal uterus and adnexae     02/09/12 Pathology Products of ConceptionCHORIONIC VILLI CONSISTENT WITH PRODUCTS OF CONCEPTION.  Assessment:    Doing well postoperatively. Operative findings again reviewed. Pathology report discussed.    Plan:   Normal postoperative exam Normal pap in 11/2011 Return to clinic for any scheduled appointments or for any gynecologic concerns as needed

## 2012-03-15 NOTE — Patient Instructions (Signed)
Return to clinic for any scheduled appointments or for any gynecologic concerns as needed.   

## 2012-09-09 ENCOUNTER — Encounter (HOSPITAL_COMMUNITY): Payer: Self-pay | Admitting: Emergency Medicine

## 2012-09-09 ENCOUNTER — Emergency Department (HOSPITAL_COMMUNITY)
Admission: EM | Admit: 2012-09-09 | Discharge: 2012-09-09 | Disposition: A | Discharge status: Home / Self Care | Payer: Medicaid Other | Source: Home / Self Care | Attending: Family Medicine | Admitting: Family Medicine

## 2012-09-09 DIAGNOSIS — N390 Urinary tract infection, site not specified: Secondary | ICD-10-CM

## 2012-09-09 DIAGNOSIS — Z23 Encounter for immunization: Secondary | ICD-10-CM

## 2012-09-09 LAB — POCT URINALYSIS DIP (DEVICE)
Glucose, UA: NEGATIVE mg/dL
Ketones, ur: NEGATIVE mg/dL
Protein, ur: 30 mg/dL — AB

## 2012-09-09 MED ORDER — INFLUENZA VIRUS VACC SPLIT PF IM SUSP
0.5000 mL | Freq: Once | INTRAMUSCULAR | Status: AC
  Administered 2012-09-09: 0.5 mL via INTRAMUSCULAR

## 2012-09-09 MED ORDER — CEPHALEXIN 500 MG PO CAPS: 500.0000 mg | ORAL_CAPSULE | Freq: Four times a day (QID) | ORAL | Status: DC

## 2012-09-09 NOTE — ED Provider Notes (Signed)
History     CSN: [id number]  Arrival date & time 09/09/12  1155   First MD Initiated Contact with Patient 09/09/12 1159      No chief complaint on file.   (Consider location/radiation/quality/duration/timing/severity/associated sxs/prior treatment) Patient is a 41 y.o. female presenting with frequency. The history is provided by the patient.  Urinary Frequency This is a new problem. The current episode started more than 2 days ago. The problem has been gradually worsening. Pertinent negatives include no abdominal pain and no shortness of breath. Associated symptoms comments: H/o uti with urologic eval 1 yr ago..    Past Medical History  Diagnosis Date  . Hyperlipidemia   . Cystitis 07/2008    under care of [facility]  . S/P dilatation of esophageal stricture 2006-2008  . Plantar fasciitis     s/p left gastroc slide   . Pain, dental 2010    history - teeth ext per patient  . Macular degeneration 2013    Age related per pt - no meds  . Arthritis     bulging disc L4/5  L5/S1  . Hepatitis     History Hep A at age 73 yrs - no prev problems  . H/O hiatal hernia     no meds - diet controlled  . Depression     history - no meds  . Anxiety     history - no meds    Past Surgical History  Procedure Date  . Pancystourethroscopy   . Dilatation of esophageal stricture 2005-2008  . Gastroc slide 2005    left side   . Cesarean section     1991  . Svd 1999    x 1  . Plantar fasciitis     left foot  . Cholecystectomy 1991  . Colonoscopy   . Dilation and evacuation 02/09/2012    Procedure: DILATATION AND EVACUATION;  Surgeon: Tereso Newcomer, MD;  Location: [facility];  Service: Gynecology;  Laterality: N/A;  With ultrasound guidance    Family History  Problem Relation Age of Onset  . Diabetes Mother   . Hyperlipidemia Mother     History  Substance Use Topics  . Smoking status: Current Every Day Smoker -- 0.2 packs/day for 8 years    Types: Cigarettes  .  Smokeless tobacco: Never Used  . Alcohol Use: Yes     Comment: rarely    OB History    Grav Para Term Preterm Abortions TAB SAB Ect Mult Living   3 2 2       2       Review of Systems  Constitutional: Negative for fever and chills.  Respiratory: Negative for shortness of breath.   Gastrointestinal: Negative.  Negative for abdominal pain.  Genitourinary: Positive for dysuria, urgency, frequency and pelvic pain. Negative for hematuria, flank pain and menstrual problem.    Allergies  Azithromycin and Penicillins  Home Medications   Current Outpatient Rx  Name  Route  Sig  Dispense  Refill  . ZOLPIDEM TARTRATE 10 MG PO TABS      TAKE 1 TABLET BY MOUTH AT BEDTIME AS NEEDED   30 tablet   0     LMP 10/08/2011  Physical Exam  Nursing note and vitals reviewed. Constitutional: She appears well-developed and well-nourished.  Pulmonary/Chest: Effort normal and breath sounds normal.  Abdominal: Soft. Bowel sounds are normal. There is tenderness in the suprapubic area. There is no rigidity, no guarding and no CVA tenderness.    ED  Course  Procedures (including critical care time)  Labs Reviewed - No data to display No results found.   No diagnosis found.    MDM          Linna Hoff, MD 09/09/12 [phone]

## 2012-09-09 NOTE — ED Notes (Signed)
Pt c/o noticing a foul odor to urine three to four days ago and pain started with voiding yesterday. Pt is experiencing some pressure and frequency with urinating.   Pt request to have flu vaccine. Pt denies any flu like symptoms.

## 2012-09-11 LAB — URINE CULTURE: Colony Count: >=100000

## 2012-09-11 NOTE — ED Notes (Signed)
Urine culture: >100,000 colonies E. Coli.  Pt. Adequately treated with Keflex. Vassie Moselle 09/11/2012

## 2012-10-01 ENCOUNTER — Emergency Department (HOSPITAL_COMMUNITY): Payer: Medicaid Other

## 2012-10-01 ENCOUNTER — Emergency Department (HOSPITAL_COMMUNITY)
Admission: EM | Admit: 2012-10-01 | Discharge: 2012-10-01 | Disposition: A | Discharge status: Home / Self Care | Payer: Medicaid Other | Attending: Emergency Medicine | Admitting: Emergency Medicine

## 2012-10-01 ENCOUNTER — Encounter (HOSPITAL_COMMUNITY): Payer: Self-pay | Admitting: Emergency Medicine

## 2012-10-01 DIAGNOSIS — Z8719 Personal history of other diseases of the digestive system: Secondary | ICD-10-CM | POA: Insufficient documentation

## 2012-10-01 DIAGNOSIS — N390 Urinary tract infection, site not specified: Secondary | ICD-10-CM | POA: Insufficient documentation

## 2012-10-01 DIAGNOSIS — Z8659 Personal history of other mental and behavioral disorders: Secondary | ICD-10-CM | POA: Insufficient documentation

## 2012-10-01 DIAGNOSIS — F17200 Nicotine dependence, unspecified, uncomplicated: Secondary | ICD-10-CM | POA: Insufficient documentation

## 2012-10-01 DIAGNOSIS — H3530 Unspecified macular degeneration: Secondary | ICD-10-CM | POA: Insufficient documentation

## 2012-10-01 DIAGNOSIS — E785 Hyperlipidemia, unspecified: Secondary | ICD-10-CM | POA: Insufficient documentation

## 2012-10-01 DIAGNOSIS — R110 Nausea: Secondary | ICD-10-CM | POA: Insufficient documentation

## 2012-10-01 DIAGNOSIS — M722 Plantar fascial fibromatosis: Secondary | ICD-10-CM | POA: Insufficient documentation

## 2012-10-01 DIAGNOSIS — Z87448 Personal history of other diseases of urinary system: Secondary | ICD-10-CM | POA: Insufficient documentation

## 2012-10-01 DIAGNOSIS — Z8739 Personal history of other diseases of the musculoskeletal system and connective tissue: Secondary | ICD-10-CM | POA: Insufficient documentation

## 2012-10-01 DIAGNOSIS — Z3202 Encounter for pregnancy test, result negative: Secondary | ICD-10-CM | POA: Insufficient documentation

## 2012-10-01 LAB — COMPREHENSIVE METABOLIC PANEL
AST: 12 U/L (ref 0–37)
BUN: 6 mg/dL (ref 6–23)
CO2: 31 mEq/L (ref 19–32)
Calcium: 9.4 mg/dL (ref 8.4–10.5)
Creatinine, Ser: 0.64 mg/dL (ref 0.50–1.10)
GFR calc non Af Amer: >90 mL/min (ref >90)

## 2012-10-01 LAB — URINALYSIS, ROUTINE W REFLEX MICROSCOPIC
Nitrite: NEGATIVE
Specific Gravity, Urine: 1.012 (ref 1.005–1.030)
Urobilinogen, UA: 0.2 mg/dL (ref 0.0–1.0)

## 2012-10-01 LAB — CBC WITH DIFFERENTIAL/PLATELET
Basophils Absolute: 0 10*3/uL (ref 0.0–0.1)
Basophils Relative: 0 % (ref 0–1)
Eosinophils Relative: 1 % (ref 0–5)
HCT: 40.4 % (ref 36.0–46.0)
Lymphocytes Relative: 31 % (ref 12–46)
MCHC: 35.4 g/dL (ref 30.0–36.0)
MCV: 88 fL (ref 78.0–100.0)
Monocytes Absolute: 0.8 10*3/uL (ref 0.1–1.0)
RDW: 11.8 % (ref 11.5–15.5)

## 2012-10-01 LAB — URINE MICROSCOPIC-ADD ON

## 2012-10-01 LAB — WET PREP, GENITAL: Trich, Wet Prep: NONE SEEN

## 2012-10-01 LAB — PREGNANCY, URINE: Preg Test, Ur: NEGATIVE

## 2012-10-01 MED ORDER — OXYCODONE-ACETAMINOPHEN 5-325 MG PO TABS: 1.0000 | ORAL_TABLET | Freq: Four times a day (QID) | ORAL | Status: DC | PRN

## 2012-10-01 MED ORDER — PHENAZOPYRIDINE HCL 200 MG PO TABS: 200.0000 mg | ORAL_TABLET | Freq: Three times a day (TID) | ORAL | Status: DC

## 2012-10-01 MED ORDER — MORPHINE SULFATE 4 MG/ML IJ SOLN
4.0000 mg | Freq: Once | INTRAMUSCULAR | Status: AC
  Administered 2012-10-01: 4 mg via INTRAVENOUS
  Filled 2012-10-01: qty 1

## 2012-10-01 MED ORDER — IOHEXOL 300 MG/ML  SOLN
100.0000 mL | Freq: Once | INTRAMUSCULAR | Status: AC | PRN
  Administered 2012-10-01: 100 mL via INTRAVENOUS

## 2012-10-01 MED ORDER — KETOROLAC TROMETHAMINE 30 MG/ML IJ SOLN
30.0000 mg | Freq: Once | INTRAMUSCULAR | Status: AC
  Administered 2012-10-01: 30 mg via INTRAVENOUS
  Filled 2012-10-01: qty 1

## 2012-10-01 MED ORDER — ONDANSETRON HCL 4 MG/2ML IJ SOLN
4.0000 mg | Freq: Once | INTRAMUSCULAR | Status: AC
  Administered 2012-10-01: 4 mg via INTRAVENOUS
  Filled 2012-10-01: qty 2

## 2012-10-01 MED ORDER — SULFAMETHOXAZOLE-TRIMETHOPRIM 800-160 MG PO TABS: 1.0000 | ORAL_TABLET | Freq: Two times a day (BID) | ORAL | Status: DC

## 2012-10-01 NOTE — ED Provider Notes (Signed)
  Physical Exam  BP 140/67  Pulse 89  Temp 98.4 F (36.9 C) (Oral)  SpO2 100%  LMP 09/22/2012  Physical Exam Awaiting CT scan results  ED Course  Procedures  MDM neg CT Scan will dc home with antibiotics and FU with urology for recurrent UTI's       Arman Filter, NP 10/01/12 2208  Arman Filter, NP 10/01/12 1610  Arman Filter, NP 10/01/12 2213

## 2012-10-01 NOTE — ED Provider Notes (Signed)
History     CSN: [id number]  Arrival date & time 10/01/12  1700   First MD Initiated Contact with Patient 10/01/12 1813      Chief Complaint  Patient presents with  . Abdominal Pain    (Consider location/radiation/quality/duration/timing/severity/associated sxs/prior treatment) HPI Comments: Pt states that the pain was very mild 3 days ago when it started but today it is very painful:pt states that she was recently treated for uti and she is intermittently still having burning:pt denies  Vomiting diarrhea,vaginal discharge of fever:pt last bm was yesterday  Patient is a 42 y.o. female presenting with abdominal pain. The history is provided by the patient.  Abdominal Pain The primary symptoms of the illness include abdominal pain and nausea. The primary symptoms of the illness do not include vomiting, diarrhea, vaginal discharge or vaginal bleeding. The current episode started more than 2 days ago. The onset of the illness was gradual. The problem has been gradually worsening.    Past Medical History  Diagnosis Date  . Hyperlipidemia   . Cystitis 07/2008    under care of [facility]  . S/P dilatation of esophageal stricture 2006-2008  . Plantar fasciitis     s/p left gastroc slide   . Pain, dental 2010    history - teeth ext per patient  . Macular degeneration 2013    Age related per pt - no meds  . Arthritis     bulging disc L4/5  L5/S1  . Hepatitis     History Hep A at age 73 yrs - no prev problems  . H/O hiatal hernia     no meds - diet controlled  . Depression     history - no meds  . Anxiety     history - no meds    Past Surgical History  Procedure Date  . Pancystourethroscopy   . Dilatation of esophageal stricture 2005-2008  . Gastroc slide 2005    left side   . Cesarean section     1991  . Svd 1999    x 1  . Plantar fasciitis     left foot  . Cholecystectomy 1991  . Colonoscopy   . Dilation and evacuation 02/09/2012    Procedure: DILATATION AND  EVACUATION;  Surgeon: Tereso Newcomer, MD;  Location: [facility];  Service: Gynecology;  Laterality: N/A;  With ultrasound guidance    Family History  Problem Relation Age of Onset  . Diabetes Mother   . Hyperlipidemia Mother     History  Substance Use Topics  . Smoking status: Current Every Day Smoker -- 0.2 packs/day for 8 years    Types: Cigarettes  . Smokeless tobacco: Never Used  . Alcohol Use: Yes     Comment: rarely    OB History    Grav Para Term Preterm Abortions TAB SAB Ect Mult Living   3 2 2       2       Review of Systems  Constitutional: Negative.   Respiratory: Negative.   Cardiovascular: Negative.   Gastrointestinal: Positive for nausea and abdominal pain. Negative for vomiting and diarrhea.  Genitourinary: Negative for vaginal bleeding and vaginal discharge.    Allergies  Azithromycin and Penicillins  Home Medications  No current outpatient prescriptions on file.  BP 140/67  Pulse 89  Temp 98.4 F (36.9 C) (Oral)  SpO2 100%  LMP 09/22/2012  Physical Exam  Nursing note and vitals reviewed. Constitutional: She is oriented to person, place, and time. She  appears well-developed and well-nourished.  HENT:  Head: Normocephalic and atraumatic.  Eyes: Conjunctivae normal and EOM are normal.  Neck: Normal range of motion. Neck supple.  Cardiovascular: Normal rate and regular rhythm.   Pulmonary/Chest: Effort normal and breath sounds normal.  Abdominal: Soft. Bowel sounds are normal. There is tenderness in the right lower quadrant. There is no rebound.  Genitourinary:       White vaginal discharge:no cmt:no right adnexal tenderness or fullness  Musculoskeletal: Normal range of motion.  Neurological: She is alert and oriented to person, place, and time.  Skin: Skin is warm and dry.  Psychiatric: She has a normal mood and affect.    ED Course  Procedures (including critical care time)  Labs Reviewed  COMPREHENSIVE METABOLIC PANEL - Abnormal; Notable  for the following:    Potassium 3.1 (*)     Glucose, Bld 113 (*)     All other components within normal limits  URINALYSIS, ROUTINE W REFLEX MICROSCOPIC - Abnormal; Notable for the following:    Hgb urine dipstick MODERATE (*)     Leukocytes, UA TRACE (*)     All other components within normal limits  CBC WITH DIFFERENTIAL  PREGNANCY, URINE  WET PREP, GENITAL  URINE MICROSCOPIC-ADD ON  GC/CHLAMYDIA PROBE AMP  URINE CULTURE   No results found.   1. UTI (lower urinary tract infection)       MDM  Ct scan is pending:pt left with Sharen Hones NP        Teressa Lower, NP 10/01/12 [phone]

## 2012-10-01 NOTE — ED Notes (Signed)
NP at bedside.

## 2012-10-01 NOTE — ED Notes (Signed)
Pt states that she has been having R sided abdominal pain for several days that has progressively gotten worse and moved to her RLQ.

## 2012-10-01 NOTE — ED Notes (Signed)
Pt finished contrast

## 2012-10-01 NOTE — ED Notes (Signed)
NP at bedside performing pelvic exam.

## 2012-10-02 LAB — GC/CHLAMYDIA PROBE AMP: CT Probe RNA: NEGATIVE

## 2012-10-04 LAB — URINE CULTURE

## 2012-10-08 NOTE — ED Provider Notes (Signed)
Medical screening examination/treatment/procedure(s) were performed by non-physician practitioner and as supervising physician I was immediately available for consultation/collaboration.  Shakaria Raphael M Demetrious Rainford, MD 10/08/12 2309 

## 2012-10-08 NOTE — ED Provider Notes (Signed)
Medical screening examination/treatment/procedure(s) were performed by non-physician practitioner and as supervising physician I was immediately available for consultation/collaboration.  Aquilla Shambley M Tura Roller, MD 10/08/12 2308 

## 2012-11-23 ENCOUNTER — Other Ambulatory Visit (HOSPITAL_COMMUNITY)
Admission: RE | Admit: 2012-11-23 | Discharge: 2012-11-23 | Disposition: A | Discharge status: Home / Self Care | Payer: Medicaid Other | Source: Ambulatory Visit | Attending: Internal Medicine | Admitting: Internal Medicine

## 2012-11-23 ENCOUNTER — Ambulatory Visit (INDEPENDENT_AMBULATORY_CARE_PROVIDER_SITE_OTHER): Payer: Medicaid Other | Admitting: Internal Medicine

## 2012-11-23 ENCOUNTER — Encounter: Payer: Self-pay | Admitting: Internal Medicine

## 2012-11-23 VITALS — BP 131/81 | HR 86 | Temp 98.8°F | Ht 60.0 in | Wt 153.7 lb

## 2012-11-23 DIAGNOSIS — R300 Dysuria: Secondary | ICD-10-CM

## 2012-11-23 DIAGNOSIS — Z113 Encounter for screening for infections with a predominantly sexual mode of transmission: Secondary | ICD-10-CM | POA: Insufficient documentation

## 2012-11-23 DIAGNOSIS — R319 Hematuria, unspecified: Secondary | ICD-10-CM

## 2012-11-23 DIAGNOSIS — N3090 Cystitis, unspecified without hematuria: Secondary | ICD-10-CM

## 2012-11-23 DIAGNOSIS — F17200 Nicotine dependence, unspecified, uncomplicated: Secondary | ICD-10-CM

## 2012-11-23 DIAGNOSIS — K222 Esophageal obstruction: Secondary | ICD-10-CM

## 2012-11-23 DIAGNOSIS — Z23 Encounter for immunization: Secondary | ICD-10-CM

## 2012-11-23 DIAGNOSIS — M549 Dorsalgia, unspecified: Secondary | ICD-10-CM

## 2012-11-23 LAB — POCT URINALYSIS DIPSTICK
Bilirubin, UA: NEGATIVE
Glucose, UA: NEGATIVE
Ketones, UA: NEGATIVE
pH, UA: 7

## 2012-11-23 MED ORDER — OMEPRAZOLE 20 MG PO CPDR: 20.0000 mg | DELAYED_RELEASE_CAPSULE | Freq: Every day | ORAL | Status: DC

## 2012-11-23 NOTE — Progress Notes (Signed)
Pt has an apptw/Dr Anyanwu at [facility] Wed 12/20/12@ 1345PM;appt card given to pt.

## 2012-11-23 NOTE — Patient Instructions (Addendum)
[facility] and gyn exam as requested  Please follow up with GI in regards to your esophageal stricture.  In the mean time, resume prilosec daily.  If you notice this worsening, call clinic or Dr. Madilyn Fireman office.    Please follow up with Dr. Janifer Adie on your appointment 12/04/12  Follow up with ortho.  Consider PT and supportive therapy as well as we discussed  You got your TDAP vaccine today  Call [id number], please strongly consider smoking cessation

## 2012-11-24 LAB — URINALYSIS, MICROSCOPIC ONLY
Casts: NONE SEEN
Crystals: NONE SEEN

## 2012-11-24 LAB — URINALYSIS, ROUTINE W REFLEX MICROSCOPIC
Bilirubin Urine: NEGATIVE
Ketones, ur: NEGATIVE mg/dL
Nitrite: NEGATIVE
Protein, ur: NEGATIVE mg/dL
Specific Gravity, Urine: 1.009 (ref 1.005–1.030)
pH: 7 (ref 5.0–8.0)

## 2012-11-26 DIAGNOSIS — R319 Hematuria, unspecified: Secondary | ICD-10-CM | POA: Insufficient documentation

## 2012-11-26 NOTE — Assessment & Plan Note (Signed)
Still smoking ~4-5cigs/day  -counseled extensively on smoking cessation -does not appear ready to quit at this time -recommended [id number]

## 2012-11-26 NOTE — Assessment & Plan Note (Addendum)
Followed by Dr. Johny Sax urology in the past and diagnosed with interstitial cystitis.  Has appointment on 3/24.  U/a has shown moderate to large Hb, but today showed 0-2rbc--possibly secondary to myoglobinuria? This was a post-menses sample after approximately 1 week of last day of cycle.  Hx of frequent UTIs and cystitis.  Given smoking hx concern for bladder or renal carcinoma.  However, CT abdomen/pelvis 09/2012: The kidneys are unremarkable in appearance. There is no evidence  of hydronephrosis. No renal or ureteral stones are seen. No  perinephric stranding is appreciated.  The bladder is relatively decompressed and grossly unremarkable.  The uterus is within normal limits. The ovaries are grossly  symmetric, with scattered small follicles. No suspicious adnexal  masses are seen. No inguinal lymphadenopathy is seen.  No acute osseous abnormalities are identified.  IMPRESSION:  No acute abnormality seen within the abdomen or pelvis.   -urine cytology negative for gonnorhea or chlamydia deferred wet prep/pap today as would like [facility]--need to make appointment as soon as possible -f/u urology appt 3/24 -in regards to myoglobinuria--does not appear to have symptoms of myalgias or possible muscle breakdown at this time.  Total ck in 2011 was 37.  Complains of lower back pain at site of disc protrusion.  Not on any statin therapy.  Denies any recent trauma or immobilization.  Unknown etiology at this time.   -if muscle pain results, will need to follow up with CK studies and further investigation as necessary.

## 2012-11-26 NOTE — Assessment & Plan Note (Signed)
Has been on percocet in the past.  Claims to have pain again in lower back and legs.  Has been followed by [facility] in the past and would like follow up as she says she lost insurance and was not able to continue with appointments. Has been on percocet with relief in the past.  Claims to have tried PT and almost everything in the past with no relief until she went to ortho.    -will not prescribe pain medications.  Defer to ortho if appropriate.   -consider supportive therapy and PT eval -f/u ortho

## 2012-11-26 NOTE — Assessment & Plan Note (Addendum)
U/A: small leukocytes, moderate bacteria, 3-6 wbc.  Large Hb but 0-2 rbc.  Endorsing occasional dysuria at the end of urination and foul smelling urine.  Does not use protection, s/p miscarriage last year.  Hx of frequent UTs.  S/p pancystourethroscopy and followed by urology.    -f/u urology--has appointment on 3/24 -deferred wet-prep today as she would like to get it all done with her obgyn.  Will request appointment as soon as possible to be done there.   -urine cytology negative for gonorrhea and chlamydia

## 2012-11-26 NOTE — Assessment & Plan Note (Addendum)
Has seen [facility]--Dr. Madilyn Fireman in the past.    -claims to have trouble swallowing again for several months.  Briefly apparently lost insurance in the past but now is renewed.  Trouble swallowing mainly solid food with occasional regurge.   -refer to GI--Dr. Madilyn Fireman -restart prilosec

## 2012-11-26 NOTE — Progress Notes (Signed)
Subjective:   Patient ID: [name] [name] female   DOB: 04-10-1971 42 y.o.   MRN: [id number]  HPI: EmmaEmma [name] is a 42 y.o. white female with PMH of esophageal strictures s/p multiple dilation procedures, s/p miscarriage 42 last year, cystitis, and chronic tobacco abuse presenting to clinic today for complaints of dysphagia similar to prior episodes in the past and referrals for GI and Ortho.    [name] [name] claims she lost insurance for a while and thus lost touch with her ortho, urology, and GI specialists and needs to reconnect with them now that her insurance is back.  She claims she has been having increasing difficulty with swallowing (mainly solid food), difficult to digest foot, regurgitation occasionally of food, all very similar to her multiple stricture episodes in the past.  She was last seen by Dr. Madilyn Fireman from [facility]. Also endorses feeling of acid reflux.    She also reports bulging lumbar spine that was previously evaluated by ortho and was under discussion of possible steroid injections but had to stop going due to lack of insurance.  She claims the pain has been getting increasingly worse and would like to get re-evaluated.    She also endorses mal-odorous urine--strong smell, but denies any discharge, itching, and seldom dysuria at the end of urination.  She is not sure if she has a UTI.  She is sexually active with her long time boyfriend in a monogamous relationship.  Of note, she did have a miscarriage last year and is still considering being pregnant at this time.  She used to follow up regularly at [facility] hospital clinic for evaluation and well women exams and would like to be seen there again.    Of note, she continues to smoke cigarettes despite extensive counseling and cessation recommended.    Past Medical History  Diagnosis Date  . Hyperlipidemia   . Cystitis 07/2008    under care of [facility]  . S/P dilatation of esophageal stricture 2006-2008  . Plantar  fasciitis     s/p left gastroc slide   . Pain, dental 2010    history - teeth ext per patient  . Macular degeneration 2013    Age related per pt - no meds 42 yrs - no meds  . Arthritis     bulging disc L4/5  L5/S1  . Hepatitis     History Hep A at age 42 yrs - no prev problems  . H/O hiatal hernia     no meds - diet controlled  . Depression     history - no meds  . Anxiety     history - no meds   Current Outpatient Prescriptions  Medication Sig Dispense Refill  . omeprazole (PRILOSEC) 20 MG capsule Take 1 capsule (20 mg total) by mouth daily.  30 capsule  1   No current facility-administered medications for this visit.   Family History  Problem Relation Age of Onset  . Diabetes Mother   . Hyperlipidemia Mother    History   Social History  . Marital Status: Single    Spouse Name: N/A    Number of Children: N/A  . Years of Education: N/A   Social History Main Topics  . Smoking status: Current Every Day Smoker -- 0.25 packs/day for 8 years    Types: Cigarettes  . Smokeless tobacco: Never Used     Comment: 4-5 cigs/day  . Alcohol Use: Yes     Comment: rarely  . Drug Use: No  . Sexually Active:  Yes    Birth Control/ Protection: None   Other Topics Concern  . None   Social History Narrative   Current smoker 1/2 PPD   Denies alcohol use   2 children    Review of Systems: Constitutional: Denies fever, chills, diaphoresis, appetite change and fatigue.  HEENT: trouble swallowing.  Denies photophobia, eye pain, redness, hearing loss, ear pain, congestion, sore throat, rhinorrhea, sneezing, mouth sores, neck pain, neck stiffness and tinnitus.   Respiratory: Denies SOB, DOE, cough, chest tightness,  and wheezing.   Cardiovascular: Denies chest pain, palpitations and leg swelling.  Gastrointestinal: Denies nausea, vomiting, abdominal pain, diarrhea, constipation, blood in stool and abdominal distention.  Genitourinary: occasional dysuria, malodorous urine.  Denies urgency, frequency,  hematuria, flank pain and difficulty urinating.  Musculoskeletal: Chronic back pain.  Denies myalgias, joint swelling, arthralgias and gait problem.  Skin: Denies pallor, rash and wound.  Neurological: Denies dizziness, seizures, syncope, weakness, light-headedness, numbness and headaches.  Hematological: Denies adenopathy. Easy bruising, personal or family bleeding history  Psychiatric/Behavioral: Denies suicidal ideation, mood changes, confusion, nervousness, sleep disturbance and agitation  Objective:  Physical Exam: Filed Vitals:   11/23/12 1311  BP: 131/81  Pulse: 86  Temp: 98.8 F (37.1 C)  TempSrc: Oral  Height: 5' (1.524 m)  Weight: 153 lb 11.2 oz (69.718 kg)  SpO2: 99%   Constitutional: Vital signs reviewed.  Patient is a well-developed and well-nourished female in no acute distress and cooperative with exam. Alert and oriented x3.  Head: Normocephalic and atraumatic Ear: TM normal bilaterally Mouth: no erythema or exudates, MMM Eyes: PERRL, EOMI, conjunctivae normal, No scleral icterus.  Neck: Supple, Trachea midline normal ROM, No JVD, mass, thyromegaly, or carotid bruit present.  Cardiovascular: RRR, S1 normal, S2 normal, no MRG, pulses symmetric and intact bilaterally Pulmonary/Chest: CTAB, no wheezes, rales, or rhonchi Abdominal: Soft. Non-tender, non-distended, bowel sounds are normal, no masses, organomegaly, or guarding present.  GU: no CVA tenderness Musculoskeletal: No joint deformities, erythema, or stiffness, ROM full.  Tenderness to palpation of lower back b/l Hematology: no cervical, inginal, or axillary adenopathy.  Neurological: A&O x3, Strength is normal and symmetric bilaterally, cranial nerve II-XII are grossly intact, no focal motor deficit, sensory intact to light touch bilaterally.  Skin: Warm, dry and intact. No rash, cyanosis, or clubbing.  Psychiatric: Normal mood and affect. speech and behavior is normal. Judgment and thought content normal.  Cognition and memory are normal.   Assessment & Plan:  Discussed with Dr. Josem Kaufmann ?hematuria--moderate hb on urine dipsticks in the past but u/a reveals 0-2rbc. appt with dr. Retta Diones from urology 3/24. Gi and ortho referrals made    tdap vaccine given today

## 2012-11-27 ENCOUNTER — Telehealth: Payer: Self-pay | Admitting: Internal Medicine

## 2012-11-27 NOTE — Assessment & Plan Note (Signed)
Vague complaints of mild dysuria after voiding very infrequently.  Does not feel like her other episodes she claims.  Small 3-6 wbc on u/a, negative nitrite.   -given vague complaints and not very indicative supporting evidence on u/a, will not treat with antibiotics at this time -discussed in detail that if symptoms return or worsen, will need to come back and be seen and likely repeat u/a with culture and consider antibiotics -hx of chronic interstitial cystitis

## 2012-11-27 NOTE — Telephone Encounter (Signed)
Dicussed with [name] [name] this afternoon on the telephone.  I called women's clinic this morning to see if she could be seen sooner, however, first available was 4/2 but we will keep her appt with her regular doctor on 4/9.  In the meantime she is to see her urologist on 3/24 and may try to see him sooner if possible.    She denies any worsening of urinary symptoms or complaints at this time.  I have instructed her to return to clinic for examination and likely repeat u/a if her symptoms worsen, dysuria, fever, hematuria and need to re-evaluate.  In the meantime, she will plan to follow up with urology and obgyn.  She understands  And agrees.    I also discussed with her about her pain.  It does not seem muscular in origin based on her description and focused more on lower back at site of disk protrusion.  She has an appointment to see her orthopedic doctor tomorrow.

## 2012-12-20 ENCOUNTER — Ambulatory Visit: Payer: Medicaid Other | Admitting: Obstetrics & Gynecology

## 2013-02-26 ENCOUNTER — Other Ambulatory Visit: Payer: Self-pay | Admitting: Specialist

## 2013-02-26 DIAGNOSIS — M545 Low back pain: Secondary | ICD-10-CM

## 2013-03-04 ENCOUNTER — Ambulatory Visit
Admission: RE | Admit: 2013-03-04 | Discharge: 2013-03-04 | Disposition: A | Discharge status: Home / Self Care | Payer: Medicaid Other | Source: Ambulatory Visit | Attending: Specialist | Admitting: Specialist

## 2013-03-04 DIAGNOSIS — M545 Low back pain: Secondary | ICD-10-CM

## 2014-06-07 ENCOUNTER — Encounter: Payer: Self-pay | Admitting: Internal Medicine

## 2014-06-07 ENCOUNTER — Telehealth: Payer: Self-pay | Admitting: *Deleted

## 2014-06-07 ENCOUNTER — Ambulatory Visit (INDEPENDENT_AMBULATORY_CARE_PROVIDER_SITE_OTHER): Payer: Self-pay | Admitting: Internal Medicine

## 2014-06-07 VITALS — BP 122/70 | HR 116 | Temp 97.9°F | Ht 60.0 in | Wt 127.9 lb

## 2014-06-07 DIAGNOSIS — O09521 Supervision of elderly multigravida, first trimester: Secondary | ICD-10-CM

## 2014-06-07 DIAGNOSIS — Z3491 Encounter for supervision of normal pregnancy, unspecified, first trimester: Secondary | ICD-10-CM

## 2014-06-07 DIAGNOSIS — Z3490 Encounter for supervision of normal pregnancy, unspecified, unspecified trimester: Secondary | ICD-10-CM | POA: Insufficient documentation

## 2014-06-07 DIAGNOSIS — Z3480 Encounter for supervision of other normal pregnancy, unspecified trimester: Secondary | ICD-10-CM

## 2014-06-07 DIAGNOSIS — O09529 Supervision of elderly multigravida, unspecified trimester: Secondary | ICD-10-CM

## 2014-06-07 LAB — POCT URINE PREGNANCY: PREG TEST UR: POSITIVE

## 2014-06-07 NOTE — Patient Instructions (Signed)
General Instructions: Prenatal Care  WHAT IS PRENATAL CARE?  Prenatal care means health care during your pregnancy, before your baby is born. It is very important to take care of yourself and your baby during your pregnancy by:   Getting early prenatal care. If you know you are pregnant, or think you might be pregnant, call your health care provider as soon as possible. Schedule a visit for a prenatal exam.  Getting regular prenatal care. Follow your health care provider's schedule for blood and other necessary tests. Do not miss appointments.  Doing everything you can to keep yourself and your baby healthy during your pregnancy.  Getting complete care. Prenatal care should include evaluation of the medical, dietary, educational, psychological, and social needs of you and your significant other. The medical and genetic history of your family and the family of your baby's father should be discussed with your health care provider.  Discussing with your health care provider:  Prescription, over-the-counter, and herbal medicines that you take.  Any history of substance abuse, alcohol use, smoking, and illegal drug use.  Any history of domestic abuse and violence.  Immunizations you have received.  Your nutrition and diet.  The amount of exercise you do.  Any environmental and occupational hazards to which you are exposed.  History of sexually transmitted infections for both you and your partner.  Previous pregnancies you have had. WHY IS PRENATAL CARE SO IMPORTANT?  By regularly seeing your health care provider, you help ensure that problems can be identified early so that they can be treated as soon as possible. Other problems might be prevented. Many studies have shown that early and regular prenatal care is important for the health of mothers and their babies.  HOW CAN I TAKE CARE OF MYSELF WHILE I AM PREGNANT?  Here are ways to take care of yourself and your baby:   Start or  continue taking your multivitamin with 400 micrograms (mcg) of folic acid every day.  Get early and regular prenatal care. It is very important to see a health care provider during your pregnancy. Your health care provider will check at each visit to make sure that you and your baby are healthy. If there are any problems, action can be taken right away to help you and your baby.  Eat a healthy diet that includes:  Fruits.  Vegetables.  Foods low in saturated fat.  Whole grains.  Calcium-rich foods, such as milk, yogurt, and hard cheeses.  Drink 6-8 glasses of liquids a day.  Unless your health care provider tells you not to, try to be physically active for 30 minutes, most days of the week. If you are pressed for time, you can get your activity in through 10-minute segments, three times a day.  Do not smoke, drink alcohol, or use drugs. These can cause long-term damage to your baby. Talk with your health care provider about steps to take to stop smoking. Talk with a member of your faith community, a counselor, a trusted friend, or your health care provider if you are concerned about your alcohol or drug use.  Ask your health care provider before taking any medicine, even over-the-counter medicines. Some medicines are not safe to take during pregnancy.  Get plenty of rest and sleep.  Avoid hot tubs and saunas during pregnancy.  Do not have X-rays taken unless absolutely necessary and with the recommendation of your health care provider. A lead shield can be placed on your abdomen to protect your  baby when X-rays are taken in other parts of your body.  Do not empty the cat litter when you are pregnant. It may contain a parasite that causes an infection called toxoplasmosis, which can cause birth defects. Also, use gloves when working in garden areas used by cats.  Do not eat uncooked or undercooked meats or fish.  Do not eat soft, mold-ripened cheeses (Brie, Camembert, and chevre) or  soft, blue-veined cheese (Danish blue and Roquefort).  Stay away from toxic chemicals like:  Insecticides.  Solvents (some cleaners or paint thinners).  Lead.  Mercury.  Sexual intercourse may continue until the end of the pregnancy, unless you have a medical problem or there is a problem with the pregnancy and your health care provider tells you not to.  Do not wear high-heel shoes, especially during the second half of the pregnancy. You can lose your balance and fall.  Do not take long trips, unless absolutely necessary. Be sure to see your health care provider before going on the trip.  Do not sit in one position for more than 2 hours when on a trip.  Take a copy of your medical records when going on a trip. Know where a hospital is located in the city you are visiting, in case of an emergency.  Most dangerous household products will have pregnancy warnings on their labels. Ask your health care provider about products if you are unsure.  Limit or eliminate your caffeine intake from coffee, tea, sodas, medicines, and chocolate.  Many women continue working through pregnancy. Staying active might help you stay healthier. If you have a question about the safety or the hours you work at your particular job, talk with your health care provider.  Get informed:  Read books.  Watch videos.  Go to childbirth classes for you and your significant other.  Talk with experienced moms.  Ask your health care provider about childbirth education classes for you and your partner. Classes can help you and your partner prepare for the birth of your baby.  Ask about a baby doctor (pediatrician) and methods and pain medicine for labor, delivery, and possible cesarean delivery. HOW OFTEN SHOULD I SEE MY HEALTH CARE PROVIDER DURING PREGNANCY?  Your health care provider will give you a schedule for your prenatal visits. You will have visits more often as you get closer to the end of your  pregnancy. An average pregnancy lasts about 40 weeks.  A typical schedule includes visiting your health care provider:   About once each month during your first 6 months of pregnancy.  Every 2 weeks during the next 2 months.  Weekly in the last month, until the delivery date. Your health care provider will probably want to see you more often if:  You are older than 35 years.  Your pregnancy is high risk because you have certain health problems or problems with the pregnancy, such as:  Diabetes.  High blood pressure.  The baby is not growing on schedule, according to the dates of the pregnancy. Your health care provider will do special tests to make sure you and your baby are not having any serious problems. WHAT HAPPENS DURING PRENATAL VISITS?   At your first prenatal visit, your health care provider will do a physical exam and talk to you about your health history and the health history of your partner and your family. Your health care provider will be able to tell you what date to expect your baby to be born on.  Your first physical exam will include checks of your blood pressure, measurements of your height and weight, and an exam of your pelvic organs. Your health care provider will do a Pap test if you have not had one recently and will do cultures of your cervix to make sure there is no infection.  At each prenatal visit, there will be tests of your blood, urine, blood pressure, weight, and the progress of the baby will be checked.  At your later prenatal visits, your health care provider will check how you are doing and how your baby is developing. You may have a number of tests done as your pregnancy progresses.  Ultrasound exams are often used to check on your baby's growth and health.  You may have more urine and blood tests, as well as special tests, if needed. These may include amniocentesis to examine fluid in the pregnancy sac, stress tests to check how the baby responds  to contractions, or a biophysical profile to measure your baby's well-being. Your health care provider will explain the tests and why they are necessary.  You should be tested for high blood sugar (gestational diabetes) between the 24th and 28th weeks of your pregnancy.  You should discuss with your health care provider your plans to breastfeed or bottle-feed your baby.  Each visit is also a chance for you to learn about staying healthy during pregnancy and to ask questions.  Please bring your medicines with you each time you come to clinic.  Medicines may include prescription medications, over-the-counter medications, herbal remedies, eye drops, vitamins, or other pills.

## 2014-06-07 NOTE — Assessment & Plan Note (Signed)
Pts 4 th preganancy. Concern for pts age. Positive test in clinic. Presently during well. Still smoking cigs, and takes alcohol occasionally. Only med- Omeprazole.  Plan-  - Preg test here in clinic positive. Pt strongly advised to stop smoking cigs - Do not take alcohol - Start prenatal vits, she might have tyo crush them which she has done in the past, as she has a hx of Oesophageal strictures. - Pt prefers to call former Ob herself to make her appointment. - Pt says she has not been taking her omeprazole, cautioned pt not to take them anymore.  - Tylenol only for pain, NO Nsaids. - Information on prenatal care given to pt.

## 2014-06-07 NOTE — Progress Notes (Signed)
Patient ID: [name] [name], female   DOB: 01-08-1971, 43 y.o.   MRN: [id number]   Subjective:   Patient ID: [name] [name] female   DOB: 1971-08-12 43 y.o.   MRN: [id number]  HPI: [name] is a 43 y.o. with PMH listed below. Pt made an appointment today to check if she was pregnant. Pt checked using drugstore pregnancy kits- 2ce, 2 days apart, and they were both positive. Pt wanted confirmation here in clinic. Pt has been having some mild cramping.  Pt smokes cigarretes, and occasionally takes alcohol. Only medication on med list is omeprazole, which pt say she has not been taking. This is patients 4th pregnancy, her 1st child is 69 years old and has a 59 year old daughter. Pts last pregnancy was a miscarriage in the 1st trimester.  Past Medical History  Diagnosis Date  . Hyperlipidemia   . Cystitis 07/2008    under care of [facility]  . S/P dilatation of esophageal stricture 2006-2008  . Plantar fasciitis     s/p left gastroc slide   . Pain, dental 2010    history - teeth ext per patient  . Macular degeneration 2013    Age related per pt - no meds  . Arthritis     bulging disc L4/5  L5/S1  . Hepatitis     History Hep A at age 89 yrs - no prev problems  . H/O hiatal hernia     no meds - diet controlled  . Depression     history - no meds  . Anxiety     history - no meds   Current Outpatient Prescriptions  Medication Sig Dispense Refill  . omeprazole (PRILOSEC) 20 MG capsule Take 1 capsule (20 mg total) by mouth daily.  30 capsule  1   No current facility-administered medications for this visit.   Family History  Problem Relation Age of Onset  . Diabetes Mother   . Hyperlipidemia Mother    History   Social History  . Marital Status: Single    Spouse Name: N/A    Number of Children: N/A  . Years of Education: N/A   Social History Main Topics  . Smoking status: Current Every Day Smoker -- 0.25 packs/day for 8 years    Types: Cigarettes  . Smokeless tobacco:  Never Used     Comment: 4-5 cigs/day  . Alcohol Use: Yes     Comment: rarely  . Drug Use: No  . Sexual Activity: Yes    Birth Control/ Protection: None   Other Topics Concern  . None   Social History Narrative   Current smoker 1/2 PPD   Denies alcohol use   2 children    Review of Systems: CONSTITUTIONAL- No Fever, weightloss, night sweat or change in appetite. SKIN- No Rash, colour changes or itching. HEAD- No Headache or dizziness. Mouth/throat- No Sorethroat, dentures, or bleeding gums. RESPIRATORY- No Cough or SOB. CARDIAC- No Palpitations, DOE, PND or chest pain. GI- No nausea, vomiting, constipation, abd pain. URINARY- No Frequency, urgency, straining or dysuria. NEUROLOGIC- No Numbness, syncope, seizures or burning. [facility]- Denies depression or anxiety.  Objective:  Physical Exam: Filed Vitals:   06/07/14 1539  BP: 122/70  Pulse: 116  Temp: 97.9 F (36.6 C)  TempSrc: Oral  Height: 5' (1.524 m)  Weight: 127 lb 14.4 oz (58.015 kg)  SpO2: 99%   GENERAL- alert, co-operative, appears as stated age, not in any distress. HEENT- Atraumatic, normocephalic, PERRL, neck  supple. CARDIAC- RRR, no murmurs, rubs or gallops. RESP- Moving equal volumes of air, and clear to auscultation bilaterally, no wheezes or crackles. ABDOMEN- Soft, nontender,  no palpable masses or organomegaly, bowel sounds present. BACK- Normal curvature of the spine, No tenderness along the vertebrae, no CVA tenderness. NEURO- No obvious Cr N abnormality, strenght upper and lower extremities intact, Gait- Normal. EXTREMITIES- pulse 2+, symmetric, no pedal edema. SKIN- Warm, dry, No rash or lesion. PSYCH- Normal mood and affect, appropriate thought content and speech.  Assessment & Plan:  The patient's case and plan of care was discussed with attending physician, Dr. Lynnae January.  Please see problem based charting for assessment and plan.

## 2014-06-07 NOTE — Telephone Encounter (Signed)
Agree with appt Thanks 

## 2014-06-07 NOTE — Telephone Encounter (Signed)
Pt calls and states she has been out of work for 2 days, has taken 2 pregnancy tests and they are positive, she had a spontaneous ab 2 yrs ago and is worried. She would like to see someone today due to this and feeling weak and dizzy at times. appt dr Denton Brick at [phone]

## 2014-06-07 NOTE — Assessment & Plan Note (Deleted)
Pts 4 th preganancy. Concern for pts age. Positive test in clinic. Presently during well. Still smoking cigs, and takes alcohol occasionally. Only med- Omeprazole.  Plan-  - Preg test here in clinic positive. Pt strongly advised to stop smoking cigs - Do not take alcohol - Start prenatal vits, she might have tyo crush them which she has done in the past, as she has a hx of Oesophageal strictures. - Pt prefers to call former Ob herself to make her appointment. - Pt says she has not been taking her omeprazole, cautioned pt not to take them anymore.  - Tylenol only for pain, NO Nsaids. - Information on prenatal care given to pt.

## 2014-06-08 NOTE — Progress Notes (Signed)
Internal Medicine Clinic Attending  Case discussed with Dr. Emokpae at the time of the visit.  We reviewed the resident's history and exam and pertinent patient test results.  I agree with the assessment, diagnosis, and plan of care documented in the resident's note.  

## 2014-06-18 ENCOUNTER — Ambulatory Visit (INDEPENDENT_AMBULATORY_CARE_PROVIDER_SITE_OTHER): Payer: Self-pay | Admitting: Family Medicine

## 2014-06-18 ENCOUNTER — Encounter: Payer: Self-pay | Admitting: Family Medicine

## 2014-06-18 ENCOUNTER — Other Ambulatory Visit (HOSPITAL_COMMUNITY)
Admission: RE | Admit: 2014-06-18 | Discharge: 2014-06-18 | Disposition: A | Discharge status: Home / Self Care | Payer: Medicaid Other | Source: Ambulatory Visit | Attending: Family Medicine | Admitting: Family Medicine

## 2014-06-18 VITALS — BP 109/64 | HR 98 | Wt 129.0 lb

## 2014-06-18 DIAGNOSIS — Z23 Encounter for immunization: Secondary | ICD-10-CM

## 2014-06-18 DIAGNOSIS — R87810 Cervical high risk human papillomavirus (HPV) DNA test positive: Secondary | ICD-10-CM | POA: Insufficient documentation

## 2014-06-18 DIAGNOSIS — Z3481 Encounter for supervision of other normal pregnancy, first trimester: Secondary | ICD-10-CM

## 2014-06-18 DIAGNOSIS — Z1151 Encounter for screening for human papillomavirus (HPV): Secondary | ICD-10-CM | POA: Insufficient documentation

## 2014-06-18 DIAGNOSIS — O09521 Supervision of elderly multigravida, first trimester: Secondary | ICD-10-CM

## 2014-06-18 DIAGNOSIS — Z113 Encounter for screening for infections with a predominantly sexual mode of transmission: Secondary | ICD-10-CM | POA: Insufficient documentation

## 2014-06-18 DIAGNOSIS — Z3491 Encounter for supervision of normal pregnancy, unspecified, first trimester: Secondary | ICD-10-CM

## 2014-06-18 DIAGNOSIS — Z01411 Encounter for gynecological examination (general) (routine) with abnormal findings: Secondary | ICD-10-CM | POA: Insufficient documentation

## 2014-06-18 NOTE — Progress Notes (Signed)
Subjective:    [name] [name] is a [id number] [redacted]w[redacted]d being seen today for her first obstetrical visit.  Her obstetrical history is significant for advanced maternal age. Pregnancy history fully reviewed.  Patient reports no complaints.  Filed Vitals:   06/18/14 1513  BP: 109/64  Pulse: 98  Weight: 129 lb (58.514 kg)    HISTORY: OB History  Gravida Para Term Preterm AB SAB TAB Ectopic Multiple Living  4 2 2  1 1    2     # Outcome Date GA Lbr Len/2nd Weight Sex Delivery Anes PTL Lv  4 CUR           3 TRM 12/27/97 [redacted]w[redacted]d  7 lb 1 oz (3.204 kg) M VBAC EPI  Y  2 TRM 06/10/90 [redacted]w[redacted]d  5 lb 10 oz (2.551 kg) F CS Spinal  Y     Comments: had pre-elcampsia  1 SAB              Past Medical History  Diagnosis Date  . Hyperlipidemia   . Cystitis 07/2008    under care of [facility]  . S/P dilatation of esophageal stricture 2006-2008  . Plantar fasciitis     s/p left gastroc slide   . Pain, dental 2010    history - teeth ext per patient  . Macular degeneration 2013    Age related per pt - no meds  . Arthritis     bulging disc L4/5  L5/S1  . Hepatitis     History Hep A at age 48 yrs - no prev problems  . H/O hiatal hernia     no meds - diet controlled  . Depression     history - no meds  . Anxiety     history - no meds   Past Surgical History  Procedure Laterality Date  . Pancystourethroscopy    . Dilatation of esophageal stricture  2005-2008  . Gastroc slide  2005    left side   . Cesarean section      1991  . Svd  1999    x 1  . Plantar fasciitis      left foot  . Cholecystectomy  1991  . Colonoscopy    . Dilation and evacuation  02/09/2012    Procedure: DILATATION AND EVACUATION;  Surgeon: Osborne Oman, MD;  Location: [facility];  Service: Gynecology;  Laterality: N/A;  With ultrasound guidance   Family History  Problem Relation Age of Onset  . Diabetes Mother   . Hyperlipidemia Mother   . Stroke Mother   . Hypertension Mother      Exam    Uterus:      Pelvic Exam:    Perineum: Normal Perineum   Vulva: Bartholin's, Urethra, Skene's normal   Vagina:  normal mucosa   Cervix: multiparous appearance   Adnexa: no mass, fullness, tenderness   Bony Pelvis: average  System: Breast:  normal appearance, no masses or tenderness   Skin: normal coloration and turgor, no rashes    Neurologic: normal   Extremities: normal strength, tone, and muscle mass   HEENT sclera clear, anicteric   Mouth/Teeth mucous membranes moist, pharynx normal without lesions   Neck supple   Cardiovascular: regular rate and rhythm   Respiratory:  appears well, vitals normal, no respiratory distress, acyanotic, normal RR, ear and throat exam is normal, neck free of mass or lymphadenopathy, chest clear, no wheezing, crepitations, rhonchi, normal symmetric air entry   Abdomen: soft, non-tender;  bowel sounds normal; no masses,  no organomegaly      Assessment:    Pregnancy: [id number] Patient Active Problem List   Diagnosis Date Noted  . Hematuria, undiagnosed cause 11/26/2012  . Missed abortion 01/14/2012  . Supervision of normal subsequent pregnancy 11/24/2011  . AMA (advanced maternal age) multigravida 35+ 11/24/2011  . History of pre-eclampsia in prior pregnancy, currently pregnant 11/24/2011  . Previous cesarean delivery, antepartum condition or complication 33/38/3291  . Hx successful VBAC (vaginal birth after cesarean), currently pregnant 11/24/2011  . Recurrent UTI 04/12/2011  . TMJ PAIN 08/11/2010  . LEG PAIN, BILATERAL 10/16/2009  . Chronic interstitial cystitis 05/13/2009  . BACK PAIN 05/13/2009  . Burn of unspecified degree of upper arm 01/02/2009  . ANXIETY 10/12/2006  . TOBACCO ABUSE 10/12/2006  . DEPRESSION 10/12/2006  . ESOPHAGEAL STRICTURE 10/12/2006  . GERD 10/12/2006  . INSOMNIA 10/12/2006    TVUS reveals IUGS, + yolk sac    Plan:     Initial labs drawn. Prenatal vitamins. Problem list reviewed and updated. Genetic Screening discussed  genetic counseling referral Ultrasound discussed; fetal survey: results reviewed. Follow up in 4 weeks. Needs early 1 hour with viability scan next week or so. Barrett Holthaus S 06/18/2014

## 2014-06-18 NOTE — Patient Instructions (Signed)
First Trimester of Pregnancy The first trimester of pregnancy is from week 1 until the end of week 12 (months 1 through 3). A week after a sperm fertilizes an egg, the egg will implant on the wall of the uterus. This embryo will begin to develop into a baby. Genes from you and your partner are forming the baby. The female genes determine whether the baby is a boy or a girl. At 6-8 weeks, the eyes and face are formed, and the heartbeat can be seen on ultrasound. At the end of 12 weeks, all the baby's organs are formed.  Now that you are pregnant, you will want to do everything you can to have a healthy baby. Two of the most important things are to get good prenatal care and to follow your health care provider's instructions. Prenatal care is all the medical care you receive before the baby's birth. This care will help prevent, find, and treat any problems during the pregnancy and childbirth. BODY CHANGES Your body goes through many changes during pregnancy. The changes vary from woman to woman.   You may gain or lose a couple of pounds at first.  You may feel sick to your stomach (nauseous) and throw up (vomit). If the vomiting is uncontrollable, call your health care provider.  You may tire easily.  You may develop headaches that can be relieved by medicines approved by your health care provider.  You may urinate more often. Painful urination may mean you have a bladder infection.  You may develop heartburn as a result of your pregnancy.  You may develop constipation because certain hormones are causing the muscles that push waste through your intestines to slow down.  You may develop hemorrhoids or swollen, bulging veins (varicose veins).  Your breasts may begin to grow larger and become tender. Your nipples may stick out more, and the tissue that surrounds them (areola) may become darker.  Your gums may bleed and may be sensitive to brushing and flossing.  Dark spots or blotches  (chloasma, mask of pregnancy) may develop on your face. This will likely fade after the baby is born.  Your menstrual periods will stop.  You may have a loss of appetite.  You may develop cravings for certain kinds of food.  You may have changes in your emotions from day to day, such as being excited to be pregnant or being concerned that something may go wrong with the pregnancy and baby.  You may have more vivid and strange dreams.  You may have changes in your hair. These can include thickening of your hair, rapid growth, and changes in texture. Some women also have hair loss during or after pregnancy, or hair that feels dry or thin. Your hair will most likely return to normal after your baby is born. WHAT TO EXPECT AT YOUR PRENATAL VISITS During a routine prenatal visit:  You will be weighed to make sure you and the baby are growing normally.  Your blood pressure will be taken.  Your abdomen will be measured to track your baby's growth.  The fetal heartbeat will be listened to starting around week 10 or 12 of your pregnancy.  Test results from any previous visits will be discussed. Your health care provider may ask you:  How you are feeling.  If you are feeling the baby move.  If you have had any abnormal symptoms, such as leaking fluid, bleeding, severe headaches, or abdominal cramping.  If you have any questions. Other tests  that may be performed during your first trimester include:  Blood tests to find your blood type and to check for the presence of any previous infections. They will also be used to check for low iron levels (anemia) and Rh antibodies. Later in the pregnancy, blood tests for diabetes will be done along with other tests if problems develop.  Urine tests to check for infections, diabetes, or protein in the urine.  An ultrasound to confirm the proper growth and development of the baby.  An amniocentesis to check for possible genetic problems.  Fetal  screens for spina bifida and Down syndrome.  You may need other tests to make sure you and the baby are doing well. HOME CARE INSTRUCTIONS  Medicines  Follow your health care provider's instructions regarding medicine use. Specific medicines may be either safe or unsafe to take during pregnancy.  Take your prenatal vitamins as directed.  If you develop constipation, try taking a stool softener if your health care provider approves. Diet  Eat regular, well-balanced meals. Choose a variety of foods, such as meat or vegetable-based protein, fish, milk and low-fat dairy products, vegetables, fruits, and whole grain breads and cereals. Your health care provider will help you determine the amount of weight gain that is right for you.  Avoid raw meat and uncooked cheese. These carry germs that can cause birth defects in the baby.  Eating four or five small meals rather than three large meals a day may help relieve nausea and vomiting. If you start to feel nauseous, eating a few soda crackers can be helpful. Drinking liquids between meals instead of during meals also seems to help nausea and vomiting.  If you develop constipation, eat more high-fiber foods, such as fresh vegetables or fruit and whole grains. Drink enough fluids to keep your urine clear or pale yellow. Activity and Exercise  Exercise only as directed by your health care provider. Exercising will help you:  Control your weight.  Stay in shape.  Be prepared for labor and delivery.  Experiencing pain or cramping in the lower abdomen or low back is a good sign that you should stop exercising. Check with your health care provider before continuing normal exercises.  Try to avoid standing for long periods of time. Move your legs often if you must stand in one place for a long time.  Avoid heavy lifting.  Wear low-heeled shoes, and practice good posture.  You may continue to have sex unless your health care provider directs you  otherwise. Relief of Pain or Discomfort  Wear a good support bra for breast tenderness.   Take warm sitz baths to soothe any pain or discomfort caused by hemorrhoids. Use hemorrhoid cream if your health care provider approves.   Rest with your legs elevated if you have leg cramps or low back pain.  If you develop varicose veins in your legs, wear support hose. Elevate your feet for 15 minutes, 3-4 times a day. Limit salt in your diet. Prenatal Care  Schedule your prenatal visits by the twelfth week of pregnancy. They are usually scheduled monthly at first, then more often in the last 2 months before delivery.  Write down your questions. Take them to your prenatal visits.  Keep all your prenatal visits as directed by your health care provider. Safety  Wear your seat belt at all times when driving.  Make a list of emergency phone numbers, including numbers for family, friends, the hospital, and police and fire departments. General Tips  Ask your health care provider for a referral to a local prenatal education class. Begin classes no later than at the beginning of month 6 of your pregnancy.  Ask for help if you have counseling or nutritional needs during pregnancy. Your health care provider can offer advice or refer you to specialists for help with various needs.  Do not use hot tubs, steam rooms, or saunas.  Do not douche or use tampons or scented sanitary pads.  Do not cross your legs for long periods of time.  Avoid cat litter boxes and soil used by cats. These carry germs that can cause birth defects in the baby and possibly loss of the fetus by miscarriage or stillbirth.  Avoid all smoking, herbs, alcohol, and medicines not prescribed by your health care provider. Chemicals in these affect the formation and growth of the baby.  Schedule a dentist appointment. At home, brush your teeth with a soft toothbrush and be gentle when you floss. SEEK MEDICAL CARE IF:   You have  dizziness.  You have mild pelvic cramps, pelvic pressure, or nagging pain in the abdominal area.  You have persistent nausea, vomiting, or diarrhea.  You have a bad smelling vaginal discharge.  You have pain with urination.  You notice increased swelling in your face, hands, legs, or ankles. SEEK IMMEDIATE MEDICAL CARE IF:   You have a fever.  You are leaking fluid from your vagina.  You have spotting or bleeding from your vagina.  You have severe abdominal cramping or pain.  You have rapid weight gain or loss.  You vomit blood or material that looks like coffee grounds.  You are exposed to [place] measles and have never had them.  You are exposed to fifth disease or chickenpox.  You develop a severe headache.  You have shortness of breath.  You have any kind of trauma, such as from a fall or a car accident. Document Released: 08/24/2001 Document Revised: 01/14/2014 Document Reviewed: 07/10/2013 [facility] Patient Information 2015 [place], [place]. This information is not intended to replace advice given to you by your health care provider. Make sure you discuss any questions you have with your health care provider.  Breastfeeding Deciding to breastfeed is one of the [place] choices you can make for you and your baby. A change in hormones during pregnancy causes your breast tissue to grow and increases the number and size of your milk ducts. These hormones also allow proteins, sugars, and fats from your blood supply to make breast milk in your milk-producing glands. Hormones prevent breast milk from being released before your baby is born as well as prompt milk flow after birth. Once breastfeeding has begun, thoughts of your baby, as well as his or her sucking or crying, can stimulate the release of milk from your milk-producing glands.  BENEFITS OF BREASTFEEDING For Your Baby  Your first milk (colostrum) helps your baby's digestive system function better.   There are antibodies  in your milk that help your baby fight off infections.   Your baby has a lower incidence of asthma, allergies, and sudden infant death syndrome.   The nutrients in breast milk are better for your baby than infant formulas and are designed uniquely for your baby's needs.   Breast milk improves your baby's brain development.   Your baby is less likely to develop other conditions, such as childhood obesity, asthma, or type 2 diabetes mellitus.  For You   Breastfeeding helps to create a very special bond between  you and your baby.   Breastfeeding is convenient. Breast milk is always available at the correct temperature and costs nothing.   Breastfeeding helps to burn calories and helps you lose the weight gained during pregnancy.   Breastfeeding makes your uterus contract to its prepregnancy size faster and slows bleeding (lochia) after you give birth.   Breastfeeding helps to lower your risk of developing type 2 diabetes mellitus, osteoporosis, and breast or ovarian cancer later in life. SIGNS THAT YOUR BABY IS HUNGRY Early Signs of Hunger  Increased alertness or activity.  Stretching.  Movement of the head from side to side.  Movement of the head and opening of the mouth when the corner of the mouth or cheek is stroked (rooting).  Increased sucking sounds, smacking lips, cooing, sighing, or squeaking.  Hand-to-mouth movements.  Increased sucking of fingers or hands. Late Signs of Hunger  Fussing.  Intermittent crying. Extreme Signs of Hunger Signs of extreme hunger will require calming and consoling before your baby will be able to breastfeed successfully. Do not wait for the following signs of extreme hunger to occur before you initiate breastfeeding:   Restlessness.  A loud, strong cry.   Screaming. BREASTFEEDING BASICS Breastfeeding Initiation  Find a comfortable place to sit or lie down, with your neck and back well supported.  Place a pillow or  rolled up blanket under your baby to bring him or her to the level of your breast (if you are seated). Nursing pillows are specially designed to help support your arms and your baby while you breastfeed.  Make sure that your baby's abdomen is facing your abdomen.   Gently massage your breast. With your fingertips, massage from your chest wall toward your nipple in a circular motion. This encourages milk flow. You may need to continue this action during the feeding if your milk flows slowly.  Support your breast with 4 fingers underneath and your thumb above your nipple. Make sure your fingers are well away from your nipple and your baby's mouth.   Stroke your baby's lips gently with your finger or nipple.   When your baby's mouth is open wide enough, quickly bring your baby to your breast, placing your entire nipple and as much of the colored area around your nipple (areola) as possible into your baby's mouth.   More areola should be visible above your baby's upper lip than below the lower lip.   Your baby's tongue should be between his or her lower gum and your breast.   Ensure that your baby's mouth is correctly positioned around your nipple (latched). Your baby's lips should create a seal on your breast and be turned out (everted).  It is common for your baby to suck about 2-3 minutes in order to start the flow of breast milk. Latching Teaching your baby how to latch on to your breast properly is very important. An improper latch can cause nipple pain and decreased milk supply for you and poor weight gain in your baby. Also, if your baby is not latched onto your nipple properly, he or she may swallow some air during feeding. This can make your baby fussy. Burping your baby when you switch breasts during the feeding can help to get rid of the air. However, teaching your baby to latch on properly is still the [place] way to prevent fussiness from swallowing air while breastfeeding. Signs  that your baby has successfully latched on to your nipple:    Silent tugging or silent  sucking, without causing you pain.   Swallowing heard between every 3-4 sucks.    Muscle movement above and in front of his or her ears while sucking.  Signs that your baby has not successfully latched on to nipple:   Sucking sounds or smacking sounds from your baby while breastfeeding.  Nipple pain. If you think your baby has not latched on correctly, slip your finger into the corner of your baby's mouth to break the suction and place it between your baby's gums. Attempt breastfeeding initiation again. Signs of Successful Breastfeeding Signs from your baby:   A gradual decrease in the number of sucks or complete cessation of sucking.   Falling asleep.   Relaxation of his or her body.   Retention of a small amount of milk in his or her mouth.   Letting go of your breast by himself or herself. Signs from you:  Breasts that have increased in firmness, weight, and size 1-3 hours after feeding.   Breasts that are softer immediately after breastfeeding.  Increased milk volume, as well as a change in milk consistency and color by the fifth day of breastfeeding.   Nipples that are not sore, cracked, or bleeding. Signs That Your Randel Books is Getting Enough Milk  Wetting at least 3 diapers in a 24-hour period. The urine should be clear and pale yellow by age 69 days.  At least 3 stools in a 24-hour period by age 69 days. The stool should be soft and yellow.  At least 3 stools in a 24-hour period by age 34 days. The stool should be seedy and yellow.  No loss of weight greater than 10% of birth weight during the first 82 days of age.  Average weight gain of 4-7 ounces (113-198 g) per week after age 55 days.  Consistent daily weight gain by age 81 days, without weight loss after the age of 2 weeks. After a feeding, your baby may spit up a small amount. This is common. BREASTFEEDING FREQUENCY AND  DURATION Frequent feeding will help you make more milk and can prevent sore nipples and breast engorgement. Breastfeed when you feel the need to reduce the fullness of your breasts or when your baby shows signs of hunger. This is called "breastfeeding on demand." Avoid introducing a pacifier to your baby while you are working to establish breastfeeding (the first 4-6 weeks after your baby is born). After this time you may choose to use a pacifier. Research has shown that pacifier use during the first year of a baby's life decreases the risk of sudden infant death syndrome (SIDS). Allow your baby to feed on each breast as long as he or she wants. Breastfeed until your baby is finished feeding. When your baby unlatches or falls asleep while feeding from the first breast, offer the second breast. Because newborns are often sleepy in the first few weeks of life, you may need to awaken your baby to get him or her to feed. Breastfeeding times will vary from baby to baby. However, the following rules can serve as a guide to help you ensure that your baby is properly fed:  Newborns (babies 1 weeks of age or younger) may breastfeed every 1-3 hours.  Newborns should not go longer than 3 hours during the day or 5 hours during the night without breastfeeding.  You should breastfeed your baby a minimum of 8 times in a 24-hour period until you begin to introduce solid foods to your baby at around 6  months of age. BREAST MILK PUMPING Pumping and storing breast milk allows you to ensure that your baby is exclusively fed your breast milk, even at times when you are unable to breastfeed. This is especially important if you are going back to work while you are still breastfeeding or when you are not able to be present during feedings. Your lactation consultant can give you guidelines on how long it is safe to store breast milk.  A breast pump is a machine that allows you to pump milk from your breast into a sterile bottle.  The pumped breast milk can then be stored in a refrigerator or freezer. Some breast pumps are operated by hand, while others use electricity. Ask your lactation consultant which type will work [place] for you. Breast pumps can be purchased, but some hospitals and breastfeeding support groups lease breast pumps on a monthly basis. A lactation consultant can teach you how to hand express breast milk, if you prefer not to use a pump.  CARING FOR YOUR BREASTS WHILE YOU BREASTFEED Nipples can become dry, cracked, and sore while breastfeeding. The following recommendations can help keep your breasts moisturized and healthy:  Avoid using soap on your nipples.   Wear a supportive bra. Although not required, special nursing bras and tank tops are designed to allow access to your breasts for breastfeeding without taking off your entire bra or top. Avoid wearing underwire-style bras or extremely tight bras.  Air dry your nipples for 3-31mnutes after each feeding.   Use only cotton bra pads to absorb leaked breast milk. Leaking of breast milk between feedings is normal.   Use lanolin on your nipples after breastfeeding. Lanolin helps to maintain your skin's normal moisture barrier. If you use pure lanolin, you do not need to wash it off before feeding your baby again. Pure lanolin is not toxic to your baby. You may also hand express a few drops of breast milk and gently massage that milk into your nipples and allow the milk to air dry. In the first few weeks after giving birth, some women experience extremely full breasts (engorgement). Engorgement can make your breasts feel heavy, warm, and tender to the touch. Engorgement peaks within 3-5 days after you give birth. The following recommendations can help ease engorgement:  Completely empty your breasts while breastfeeding or pumping. You may want to start by applying warm, moist heat (in the shower or with warm water-soaked hand towels) just before feeding or  pumping. This increases circulation and helps the milk flow. If your baby does not completely empty your breasts while breastfeeding, pump any extra milk after he or she is finished.  Wear a snug bra (nursing or regular) or tank top for 1-2 days to signal your body to slightly decrease milk production.  Apply ice packs to your breasts, unless this is too uncomfortable for you.  Make sure that your baby is latched on and positioned properly while breastfeeding. If engorgement persists after 48 hours of following these recommendations, contact your health care provider or a lScience writer OVERALL HEALTH CARE RECOMMENDATIONS WHILE BREASTFEEDING  Eat healthy foods. Alternate between meals and snacks, eating 3 of each per day. Because what you eat affects your breast milk, some of the foods may make your baby more irritable than usual. Avoid eating these foods if you are sure that they are negatively affecting your baby.  Drink milk, fruit juice, and water to satisfy your thirst (about 10 glasses a day).   Rest  often, relax, and continue to take your prenatal vitamins to prevent fatigue, stress, and anemia.  Continue breast self-awareness checks.  Avoid chewing and smoking tobacco.  Avoid alcohol and drug use. Some medicines that may be harmful to your baby can pass through breast milk. It is important to ask your health care provider before taking any medicine, including all over-the-counter and prescription medicine as well as vitamin and herbal supplements. It is possible to become pregnant while breastfeeding. If birth control is desired, ask your health care provider about options that will be safe for your baby. SEEK MEDICAL CARE IF:   You feel like you want to stop breastfeeding or have become frustrated with breastfeeding.  You have painful breasts or nipples.  Your nipples are cracked or bleeding.  Your breasts are red, tender, or warm.  You have a swollen area on either  breast.  You have a fever or chills.  You have nausea or vomiting.  You have drainage other than breast milk from your nipples.  Your breasts do not become full before feedings by the fifth day after you give birth.  You feel sad and depressed.  Your baby is too sleepy to eat well.  Your baby is having trouble sleeping.   Your baby is wetting less than 3 diapers in a 24-hour period.  Your baby has less than 3 stools in a 24-hour period.  Your baby's skin or the white part of his or her eyes becomes yellow.   Your baby is not gaining weight by 5 days of age. SEEK IMMEDIATE MEDICAL CARE IF:   Your baby is overly tired (lethargic) and does not want to wake up and feed.  Your baby develops an unexplained fever. Document Released: 08/30/2005 Document Revised: 09/04/2013 Document Reviewed: 02/21/2013 [employer] Patient Information 2015 [employer]. This information is not intended to replace advice given to you by your health care provider. Make sure you discuss any questions you have with your health care provider.  

## 2014-06-19 LAB — OBSTETRIC PANEL
Antibody Screen: NEGATIVE
BASOS ABS: 0 10*3/uL (ref 0.0–0.1)
BASOS PCT: 0 % (ref 0–1)
Eosinophils Absolute: 0.3 10*3/uL (ref 0.0–0.7)
Eosinophils Relative: 3 % (ref 0–5)
HCT: 40.2 % (ref 36.0–46.0)
HEMOGLOBIN: 13.9 g/dL (ref 12.0–15.0)
HEP B S AG: NEGATIVE
Lymphocytes Relative: 27 % (ref 12–46)
Lymphs Abs: 2.7 10*3/uL (ref 0.7–4.0)
MCH: 30.5 pg (ref 26.0–34.0)
MCHC: 34.6 g/dL (ref 30.0–36.0)
MCV: 88.4 fL (ref 78.0–100.0)
MONOS PCT: 8 % (ref 3–12)
Monocytes Absolute: 0.8 10*3/uL (ref 0.1–1.0)
NEUTROS ABS: 6.2 10*3/uL (ref 1.7–7.7)
NEUTROS PCT: 62 % (ref 43–77)
Platelets: 362 10*3/uL (ref 150–400)
RBC: 4.55 MIL/uL (ref 3.87–5.11)
RDW: 13 % (ref 11.5–15.5)
Rh Type: POSITIVE
Rubella: 3.57 Index — ABNORMAL HIGH (ref <0.90)
WBC: 10 10*3/uL (ref 4.0–10.5)

## 2014-06-19 LAB — HIV ANTIBODY (ROUTINE TESTING W REFLEX): HIV 1&2 Ab, 4th Generation: NONREACTIVE

## 2014-06-20 ENCOUNTER — Encounter: Payer: Self-pay | Admitting: Family Medicine

## 2014-06-20 LAB — URINE CULTURE
Colony Count: NO GROWTH
ORGANISM ID, BACTERIA: NO GROWTH

## 2014-06-21 LAB — CYTOLOGY - PAP

## 2014-06-25 ENCOUNTER — Encounter: Payer: Self-pay | Admitting: Obstetrics & Gynecology

## 2014-06-25 ENCOUNTER — Ambulatory Visit (INDEPENDENT_AMBULATORY_CARE_PROVIDER_SITE_OTHER): Payer: Self-pay | Admitting: Obstetrics & Gynecology

## 2014-06-25 VITALS — BP 119/61 | HR 85 | Wt 132.2 lb

## 2014-06-25 DIAGNOSIS — Z3481 Encounter for supervision of other normal pregnancy, first trimester: Secondary | ICD-10-CM

## 2014-06-25 NOTE — Progress Notes (Signed)
Routine visit. No problems. U/S showed normal FHR on bedside u/s. We discussed genetic screening and she now wants NIPS. She is certain that she conceived on Sept 4/5.

## 2014-06-25 NOTE — Progress Notes (Signed)
Patient is here to confirm viability.  FHR is present on U/S and fetal pole is seen and measures 6 wks 2day.

## 2014-06-26 ENCOUNTER — Telehealth: Payer: Self-pay | Admitting: *Deleted

## 2014-06-26 NOTE — Telephone Encounter (Signed)
Message copied by Gerri Spore on Wed Jun 26, 2014  8:20 AM ------      Message from: Donnamae Jude      Created: Mon Jun 24, 2014  8:06 AM       Abnl pap-needs colpo ------

## 2014-06-26 NOTE — Telephone Encounter (Signed)
Pt aware and colposcopy scheduled.

## 2014-07-05 ENCOUNTER — Encounter: Payer: Self-pay | Admitting: Obstetrics & Gynecology

## 2014-07-05 ENCOUNTER — Ambulatory Visit (INDEPENDENT_AMBULATORY_CARE_PROVIDER_SITE_OTHER): Payer: Self-pay | Admitting: Obstetrics & Gynecology

## 2014-07-05 VITALS — BP 123/66 | HR 106 | Wt 134.0 lb

## 2014-07-05 DIAGNOSIS — R300 Dysuria: Secondary | ICD-10-CM

## 2014-07-05 DIAGNOSIS — IMO0002 Reserved for concepts with insufficient information to code with codable children: Secondary | ICD-10-CM

## 2014-07-05 DIAGNOSIS — R87610 Atypical squamous cells of undetermined significance on cytologic smear of cervix (ASC-US): Secondary | ICD-10-CM

## 2014-07-05 DIAGNOSIS — R87810 Cervical high risk human papillomavirus (HPV) DNA test positive: Secondary | ICD-10-CM

## 2014-07-05 LAB — POCT URINALYSIS DIPSTICK
Glucose, UA: NEGATIVE
Ketones, UA: NEGATIVE
Nitrite, UA: NEGATIVE
PROTEIN UA: NEGATIVE
SPEC GRAV UA: 1.025
Urobilinogen, UA: NEGATIVE
pH, UA: 7

## 2014-07-05 MED ORDER — NITROFURANTOIN MONOHYD MACRO 100 MG PO CAPS: 100.0000 mg | ORAL_CAPSULE | Freq: Two times a day (BID) | ORAL | Status: DC

## 2014-07-05 NOTE — Addendum Note (Signed)
Addended by: Lin Landsman C on: 07/05/2014 09:46 AM   Modules accepted: Orders

## 2014-07-05 NOTE — Progress Notes (Signed)
   Subjective:    Patient ID: [name] [name], female    DOB: 1970-12-26, 43 y.o.   MRN: [id number]  HPI She is here for a colposcopy due to a ASCUS +HR HPV pap. All of her other paps have been normal. She is planning to get the NIPS on 07-23-14. She is having no OB problems, just some am nausea/vomitting. She does complain of some dysuria.   Review of Systems     Objective:   Physical Exam  UPT negative, consent signed, time out done Cervix prepped with acetic acid. Transformation zone seen in its entirety. Colpo adequate. No abnormalities visualized. She tolerated the procedure well.  Bedside u/s reveals FHR 148        Assessment & Plan:  ASCUS+HPV- colpo normal Rec pap with cotesting in a year Keep OB appts Check uc&s, macrobid called to pharmacy, rec increase water

## 2014-07-06 LAB — CULTURE, OB URINE
Colony Count: NO GROWTH
Organism ID, Bacteria: NO GROWTH

## 2014-07-15 ENCOUNTER — Encounter: Payer: Self-pay | Admitting: Obstetrics & Gynecology

## 2014-07-23 ENCOUNTER — Ambulatory Visit (INDEPENDENT_AMBULATORY_CARE_PROVIDER_SITE_OTHER): Payer: Self-pay | Admitting: Obstetrics and Gynecology

## 2014-07-23 ENCOUNTER — Encounter: Payer: Self-pay | Admitting: Obstetrics and Gynecology

## 2014-07-23 VITALS — BP 122/62 | HR 95 | Wt 140.2 lb

## 2014-07-23 DIAGNOSIS — O34219 Maternal care for unspecified type scar from previous cesarean delivery: Secondary | ICD-10-CM

## 2014-07-23 DIAGNOSIS — Z3481 Encounter for supervision of other normal pregnancy, first trimester: Secondary | ICD-10-CM

## 2014-07-23 DIAGNOSIS — O09291 Supervision of pregnancy with other poor reproductive or obstetric history, first trimester: Secondary | ICD-10-CM

## 2014-07-23 DIAGNOSIS — O09521 Supervision of elderly multigravida, first trimester: Secondary | ICD-10-CM

## 2014-07-23 DIAGNOSIS — O3421 Maternal care for scar from previous cesarean delivery: Secondary | ICD-10-CM

## 2014-07-23 NOTE — Progress Notes (Signed)
Patient is doing well without complaints. Panorama testing today

## 2014-07-31 ENCOUNTER — Other Ambulatory Visit: Payer: Self-pay | Admitting: *Deleted

## 2014-07-31 ENCOUNTER — Telehealth: Payer: Self-pay | Admitting: *Deleted

## 2014-07-31 DIAGNOSIS — O285 Abnormal chromosomal and genetic finding on antenatal screening of mother: Secondary | ICD-10-CM

## 2014-07-31 NOTE — Progress Notes (Signed)
Patient is notified of appointment with MFM for November 19 at 10:00.  She is understandably worried and will call us back if she has any further questions or concerns.  She is notified the sex of the baby is female.

## 2014-07-31 NOTE — Telephone Encounter (Signed)
Spoke to patient regarding her Panorama results.  We will set up referral to MFM and call patient back later this afternoon.

## 2014-08-01 ENCOUNTER — Other Ambulatory Visit: Payer: Self-pay | Admitting: Family Medicine

## 2014-08-01 ENCOUNTER — Ambulatory Visit (HOSPITAL_COMMUNITY)
Admission: RE | Admit: 2014-08-01 | Discharge: 2014-08-01 | Disposition: A | Discharge status: Home / Self Care | Payer: Medicaid Other | Source: Ambulatory Visit | Attending: Family Medicine | Admitting: Family Medicine

## 2014-08-01 DIAGNOSIS — O09522 Supervision of elderly multigravida, second trimester: Secondary | ICD-10-CM

## 2014-08-01 DIAGNOSIS — O289 Unspecified abnormal findings on antenatal screening of mother: Secondary | ICD-10-CM

## 2014-08-01 DIAGNOSIS — Z315 Encounter for genetic counseling: Secondary | ICD-10-CM | POA: Insufficient documentation

## 2014-08-01 DIAGNOSIS — O3510X0 Maternal care for (suspected) chromosomal abnormality in fetus, unspecified, not applicable or unspecified: Secondary | ICD-10-CM

## 2014-08-01 DIAGNOSIS — Z3A12 12 weeks gestation of pregnancy: Secondary | ICD-10-CM | POA: Insufficient documentation

## 2014-08-01 DIAGNOSIS — O351XX0 Maternal care for (suspected) chromosomal abnormality in fetus, not applicable or unspecified: Secondary | ICD-10-CM | POA: Insufficient documentation

## 2014-08-01 DIAGNOSIS — IMO0002 Reserved for concepts with insufficient information to code with codable children: Secondary | ICD-10-CM | POA: Insufficient documentation

## 2014-08-01 DIAGNOSIS — O09521 Supervision of elderly multigravida, first trimester: Secondary | ICD-10-CM | POA: Insufficient documentation

## 2014-08-02 ENCOUNTER — Other Ambulatory Visit (HOSPITAL_COMMUNITY): Payer: Self-pay

## 2014-08-02 DIAGNOSIS — O3510X0 Maternal care for (suspected) chromosomal abnormality in fetus, unspecified, not applicable or unspecified: Secondary | ICD-10-CM | POA: Insufficient documentation

## 2014-08-02 DIAGNOSIS — O351XX0 Maternal care for (suspected) chromosomal abnormality in fetus, not applicable or unspecified: Secondary | ICD-10-CM | POA: Insufficient documentation

## 2014-08-02 NOTE — Progress Notes (Signed)
Genetic Counseling  High-Risk Gestation Note  Appointment Date:  08/01/2014 Referred By: Donnamae Jude, MD Date of Birth:  [birth year]   Pregnancy History: [id number] Estimated Date of Delivery: 02/10/15 Estimated Gestational Age: [redacted]w[redacted]d Attending: Benjaman Lobe, MD   [name] [name] was seen for genetic counseling because of a maternal age of 43 y.o. and given that noninvasive prenatal screening (NIPS) indicated a high risk for trisomy 62 in the pregnancy.     Ms. Bing had NIPS/cell free DNA testing (cfDNA) drawn through her OB office (Panorama, performed through [facility] laboratory) given advanced maternal age.  The result came back high risk for fetal trisomy 18. She was counseled regarding maternal age and the association with risk for chromosome conditions due to nondisjunction with aging of the ova.   We reviewed chromosomes and nondisjunction. She was counseled that the risk for aneuploidy decreases as gestational age increases, accounting for those pregnancies which spontaneously abort.  We reviewed the methodology of NIPS, detection rates, and false positive rates for the conditions screened. We discussed that the positive predictive value for this result to be indicative of trisomy 18 in the current pregnancy is 95%.   We discussed Down syndrome (trisomy 28), trisomy 34, and sex chromosome aneuploidies (47,XXX and 47,XXY) including the common features and prognoses of each. We discussed trisomy 46 in detail. We reviewed that the cell free DNA test can not distinguish between aneuploidy confined to the placenta, trisomy 36, partial trisomy 58, or mosaic trisomy 82. We discussed that trisomy 74 represents the second most common autosomal trisomy after Down syndrome and occurs in 1 in 3600-8500 livebirths. The prevalence is estimated to be much higher when pregnancy terminations, stillbirths, and miscarriages are included. Ms. Woodburn was counseled that trisomy 49 results from meiotic nondisjunction  involving the 18th pair of chromosomes in 94% of cases. Partial trisomy 18 occurs from an unbalanced translocation and represents <2% of cases; and mosaic trisomy 18 occurs in approximately 4-5% of cases.   She was then counseled that trisomy 16 is characterized by prenatal onset of growth deficiency, neurodevelopmental delay that typically results in profound mental retardation in survivors, and significant organ system anomalies. Although any organ system can be involved, we specifically discussed that cardiac defects occur in 90% of children with trisomy 18; anomalies of the brain are present in nearly all (agenesis of the corpus callosum, small cerebellum, microgyria, and ventriculomegaly) individuals with trisomy 18; myelomeningocele occurs in 5% and approximately 2/3 of children with trisomy 18 have genitourinary anomalies, most commonly a horseshoe kidney. Additionally, omphaloceles are seen in ~30% of fetuses with trisomy 18 and limb deficiency defects (short or absent radii) occur in 5-10%. We discussed that nuchal translucency ultrasound in the first trimester can assess for potential markers of trisomy 18. We also discussed that detailed ultrasound at 18+ weeks may identify other more subtle differences (markers) including choroid plexus cysts, rocker bottom feet, clenched hands, and strawberry shaped cranium. Although these later findings do not cause major medical problems, they are well described findings in fetuses with trisomy 53. Ms. Gaubert was counseled that the above discussed findings provide a generalized overview; however, each child with trisomy 24 is unique and an individualized healthcare plan must be established based on the needs of that child.   Ultrasound was performed at the time of today's visit. A cystic hygroma was visualized with no sign of fetal hydrops at this time. Complete ultrasound results reported separately. The ultrasound was performed following Ms. Nuncio'  genetic  counseling session. Though ultrasound and NIPS are not diagnostic for chromosome conditions, the positive NIPS result with the presence of a cystic hygroma makes fetal trisomy 18 in the pregnancy highly suspected.    She was counseled that the prognosis for trisomy 4 is very guarded. She understands that pregnancies with trisomy 64 have a significantly increased risk for miscarriage and stillbirth. Of those that survive to term, ~90% of infants die during the first year of life. Although this condition significantly shortens the lifespan, Ms. Lippens was counseled that ~5-8% of children do indeed survive, some well beyond a year of life. We discussed that neonates with trisomy 38 most often pass away from respiratory concerns secondary to congenital heart disease.   We discussed the option of chromosome analysis prenatally via chorionic villus sampling (up to approximately [redacted] weeks gestation) or amniocentesis (after [redacted] weeks gestation). We discussed the risks, benefits, and limitations of each. Regarding CVS, we discussed that the placenta is also the source of the sample, as is the case for NIPS, whereas amniocentesis samples cells that are fetal in origin. We discussed the approximate 1 in 557 risk for complications for CVS and the approximate 1 in 322-025 risk for complications for amniocentesis, including spontaneous pregnancy loss for each. Alternatively, we discussed that postnatal chromosome analysis can be performed. Ms. Stegemann is undecided at this point whether or not she would want to pursue diagnostic testing in the pregnancy (CVS or amniocentesis). She plans to further discuss with the father of the pregnancy. Follow-up ultrasound is planned for 08/15/14.   We discussed options for the pregnancy in the case that trisomy 18 were to be confirmed via diagnostic chromosome testing. We discussed the 20 weeks limit in [place] for termination of pregnancy. We also discussed the option of continuing a  pregnancy with trisomy 62 with palliative care. This would include serial ultrasounds, which can help determine specific organ involvement, in the case of suspected or confirmed trisomy 18 pregnancies. Although this does not clearly predict prognosis, she understands that identifying which organs are involved can help her healthcare team provide appropriate anticipatory guidance. Ms. Rochon indicated that she was unsure whether or not termination of pregnancy would be a consideration for her for the pregnancy and again stated that she needed to further discuss all of this information with the father of the pregnancy.    The patient was understandably upset today. She asked many appropriate and thoughtful questions. She was provided with a variety of educational and supportive resources today. She was encouraged to contact me directly with any further questions or concerns.   Ms. Ishler was provided with written information regarding cystic fibrosis (CF) including the carrier frequency and incidence in the Caucasian and Hispanic populations, the availability of carrier testing and prenatal diagnosis if indicated.  In addition, we discussed that CF is routinely screened for as part of the [place] newborn screening panel.  She declined CF carrier testing today.   Both family histories were reviewed and found to be contributory for intellectual disability for the patient's brother. She reported that he fell into a swimming pool at age 24 years old and nearly drowned. Per the patient's mother's report, his mental delays occurred following this incident. He is currently 43 years old and otherwise healthy. He currently lives with his mother, is unable to drive, but does maintain employment. She reported no dysmorphic features for her brother. The patient reported that her additional three brothers are healthy and do not  have intellectual disability. The patient also reported a maternal uncle who has intellectual disability,  which was attributed to him being kicked in the head by a horse when he was younger. Ms. Danielson was counseled that there are many different causes of intellectual disabilities including environmental, multifactorial, and genetic etiologies.  We discussed that a specific diagnosis for intellectual disability can be determined in approximately 50% of these individuals.  In the remaining 50% of individuals, a diagnosis may never be determined.  Regarding genetic causes, we discussed that chromosome aberrations (aneuploidy, deletions, duplications, insertions, and translocations) are responsible for a small percentage of individuals with intellectual disability.  Many individuals with chromosome aberrations have additional differences, including congenital anomalies or minor dysmorphisms.  Likewise, single gene conditions are the underlying cause of intellectual delay in some families.  We discussed that many gene conditions have intellectual disability as a feature, but also often include other physical or medical differences.  Specifically, we reviewed fragile X syndrome and the X-linked inheritance of this condition.  We discussed the option of FMR1 (the gene that when altered causes fragile X syndrome) analysis to determine whether Fragile X syndrome is the cause of intellectual disability in this family.  Ms. Passmore declined FMR1 analysis today, given the nature of today's discussion.  We discussed that without more specific information, it is difficult to provide an accurate risk assessment.  Further genetic counseling is warranted if more information is obtained.  The patient reported that her son, with a previous partner, was found postnatally to have a congenital cyst on his lung, which was surgically removed. We discussed that this description most likely fit with CCAM or CPAM. Congenital pulmonary airway malformation (CPAM), which was previously referred cystic adenomatoid malformation, describes benign  hamartomatous or dysplastic lung tumors characterized by overgrowth of terminal bronchioles. They are typically classified by type and are associated with increased risk for hydrops. We discussed that CPAM is described to be sporadic, and teratogenic exposures are not known to be associated with their development. Familial recurrence has not been described for CPAM. Without further information regarding the provided family history, an accurate genetic risk cannot be calculated. Further genetic counseling is warranted if more information is obtained.  Ms. TORRI MICHALSKI denied exposure to environmental toxins or chemical agents. She denied the use of alcohol or street drugs. She reported smoking approximately 3-4 cigarettes per day and continues to work on cutting back/stopping. The associations of smoking in pregnancy were reviewed and cessation encouraged. She denied significant viral illnesses during the course of her pregnancy. Her medical and surgical histories were noncontributory.   I counseled Ms. [name] regarding the above risks and available options.  The approximate face-to-face time with the genetic counselor was 50 minutes.  Chipper Oman, MS,  Certified Genetic Counselor 08/02/2014

## 2014-08-15 ENCOUNTER — Other Ambulatory Visit: Payer: 59 | Admitting: Family Medicine

## 2014-08-15 ENCOUNTER — Ambulatory Visit (HOSPITAL_COMMUNITY)
Admission: RE | Admit: 2014-08-15 | Discharge: 2014-08-15 | Disposition: A | Discharge status: Home / Self Care | Payer: Medicaid Other | Source: Ambulatory Visit | Attending: Family Medicine | Admitting: Family Medicine

## 2014-08-15 DIAGNOSIS — O351XX0 Maternal care for (suspected) chromosomal abnormality in fetus, not applicable or unspecified: Secondary | ICD-10-CM | POA: Insufficient documentation

## 2014-08-15 DIAGNOSIS — O99332 Smoking (tobacco) complicating pregnancy, second trimester: Secondary | ICD-10-CM | POA: Insufficient documentation

## 2014-08-15 DIAGNOSIS — F17210 Nicotine dependence, cigarettes, uncomplicated: Secondary | ICD-10-CM | POA: Insufficient documentation

## 2014-08-15 DIAGNOSIS — Z3A14 14 weeks gestation of pregnancy: Secondary | ICD-10-CM | POA: Insufficient documentation

## 2014-08-15 DIAGNOSIS — O289 Unspecified abnormal findings on antenatal screening of mother: Secondary | ICD-10-CM | POA: Insufficient documentation

## 2014-08-15 DIAGNOSIS — O09522 Supervision of elderly multigravida, second trimester: Secondary | ICD-10-CM

## 2014-08-15 DIAGNOSIS — O99312 Alcohol use complicating pregnancy, second trimester: Secondary | ICD-10-CM | POA: Insufficient documentation

## 2014-08-20 ENCOUNTER — Ambulatory Visit (INDEPENDENT_AMBULATORY_CARE_PROVIDER_SITE_OTHER): Payer: Self-pay | Admitting: Family Medicine

## 2014-08-20 VITALS — BP 101/80 | HR 109 | Wt 145.0 lb

## 2014-08-20 DIAGNOSIS — Z3482 Encounter for supervision of other normal pregnancy, second trimester: Secondary | ICD-10-CM

## 2014-08-20 NOTE — Progress Notes (Signed)
Lengthy discussion had around diagnosis, US findings, prognosis and definitive testing. She continues to not desire termination of pregnancy. F/u with MFM next week.

## 2014-08-20 NOTE — Patient Instructions (Signed)
Second Trimester of Pregnancy The second trimester is from week 13 through week 28, months 4 through 6. The second trimester is often a time when you feel your best. Your body has also adjusted to being pregnant, and you begin to feel better physically. Usually, morning sickness has lessened or quit completely, you may have more energy, and you may have an increase in appetite. The second trimester is also a time when the fetus is growing rapidly. At the end of the sixth month, the fetus is about 9 inches long and weighs about 1 pounds. You will likely begin to feel the baby move (quickening) between 18 and 20 weeks of the pregnancy. BODY CHANGES Your body goes through many changes during pregnancy. The changes vary from woman to woman.   Your weight will continue to increase. You will notice your lower abdomen bulging out.  You may begin to get stretch marks on your hips, abdomen, and breasts.  You may develop headaches that can be relieved by medicines approved by your health care provider.  You may urinate more often because the fetus is pressing on your bladder.  You may develop or continue to have heartburn as a result of your pregnancy.  You may develop constipation because certain hormones are causing the muscles that push waste through your intestines to slow down.  You may develop hemorrhoids or swollen, bulging veins (varicose veins).  You may have back pain because of the weight gain and pregnancy hormones relaxing your joints between the bones in your pelvis and as a result of a shift in weight and the muscles that support your balance.  Your breasts will continue to grow and be tender.  Your gums may bleed and may be sensitive to brushing and flossing.  Dark spots or blotches (chloasma, mask of pregnancy) may develop on your face. This will likely fade after the baby is born.  A dark line from your belly button to the pubic area (linea nigra) may appear. This will likely fade  after the baby is born.  You may have changes in your hair. These can include thickening of your hair, rapid growth, and changes in texture. Some women also have hair loss during or after pregnancy, or hair that feels dry or thin. Your hair will most likely return to normal after your baby is born. WHAT TO EXPECT AT YOUR PRENATAL VISITS During a routine prenatal visit:  You will be weighed to make sure you and the fetus are growing normally.  Your blood pressure will be taken.  Your abdomen will be measured to track your baby's growth.  The fetal heartbeat will be listened to.  Any test results from the previous visit will be discussed. Your health care provider may ask you:  How you are feeling.  If you are feeling the baby move.  If you have had any abnormal symptoms, such as leaking fluid, bleeding, severe headaches, or abdominal cramping.  If you have any questions. Other tests that may be performed during your second trimester include:  Blood tests that check for:  Low iron levels (anemia).  Gestational diabetes (between 24 and 28 weeks).  Rh antibodies.  Urine tests to check for infections, diabetes, or protein in the urine.  An ultrasound to confirm the proper growth and development of the baby.  An amniocentesis to check for possible genetic problems.  Fetal screens for spina bifida and Down syndrome. HOME CARE INSTRUCTIONS   Avoid all smoking, herbs, alcohol, and unprescribed   drugs. These chemicals affect the formation and growth of the baby.  Follow your health care provider's instructions regarding medicine use. There are medicines that are either safe or unsafe to take during pregnancy.  Exercise only as directed by your health care provider. Experiencing uterine cramps is a good sign to stop exercising.  Continue to eat regular, healthy meals.  Wear a good support bra for breast tenderness.  Do not use hot tubs, steam rooms, or saunas.  Wear your  seat belt at all times when driving.  Avoid raw meat, uncooked cheese, cat litter boxes, and soil used by cats. These carry germs that can cause birth defects in the baby.  Take your prenatal vitamins.  Try taking a stool softener (if your health care provider approves) if you develop constipation. Eat more high-fiber foods, such as fresh vegetables or fruit and whole grains. Drink plenty of fluids to keep your urine clear or pale yellow.  Take warm sitz baths to soothe any pain or discomfort caused by hemorrhoids. Use hemorrhoid cream if your health care provider approves.  If you develop varicose veins, wear support hose. Elevate your feet for 15 minutes, 3-4 times a day. Limit salt in your diet.  Avoid heavy lifting, wear low heel shoes, and practice good posture.  Rest with your legs elevated if you have leg cramps or low back pain.  Visit your dentist if you have not gone yet during your pregnancy. Use a soft toothbrush to brush your teeth and be gentle when you floss.  A sexual relationship may be continued unless your health care provider directs you otherwise.  Continue to go to all your prenatal visits as directed by your health care provider. SEEK MEDICAL CARE IF:   You have dizziness.  You have mild pelvic cramps, pelvic pressure, or nagging pain in the abdominal area.  You have persistent nausea, vomiting, or diarrhea.  You have a bad smelling vaginal discharge.  You have pain with urination. SEEK IMMEDIATE MEDICAL CARE IF:   You have a fever.  You are leaking fluid from your vagina.  You have spotting or bleeding from your vagina.  You have severe abdominal cramping or pain.  You have rapid weight gain or loss.  You have shortness of breath with chest pain.  You notice sudden or extreme swelling of your face, hands, ankles, feet, or legs.  You have not felt your baby move in over an hour.  You have severe headaches that do not go away with  medicine.  You have vision changes. Document Released: 08/24/2001 Document Revised: 09/04/2013 Document Reviewed: 10/31/2012 ExitCare Patient Information 2015 [employer]. This information is not intended to replace advice given to you by your health care provider. Make sure you discuss any questions you have with your health care provider.  

## 2014-08-27 ENCOUNTER — Other Ambulatory Visit: Payer: Self-pay | Admitting: Family Medicine

## 2014-08-27 ENCOUNTER — Ambulatory Visit (HOSPITAL_COMMUNITY)
Admission: RE | Admit: 2014-08-27 | Discharge: 2014-08-27 | Disposition: A | Discharge status: Home / Self Care | Payer: 59 | Source: Ambulatory Visit | Attending: Family Medicine | Admitting: Family Medicine

## 2014-08-27 ENCOUNTER — Encounter: Payer: Self-pay | Admitting: Obstetrics & Gynecology

## 2014-08-27 ENCOUNTER — Ambulatory Visit (INDEPENDENT_AMBULATORY_CARE_PROVIDER_SITE_OTHER): Payer: 59 | Admitting: Family Medicine

## 2014-08-27 DIAGNOSIS — Z3A16 16 weeks gestation of pregnancy: Secondary | ICD-10-CM

## 2014-08-27 DIAGNOSIS — O021 Missed abortion: Secondary | ICD-10-CM | POA: Insufficient documentation

## 2014-08-27 DIAGNOSIS — O364XX0 Maternal care for intrauterine death, not applicable or unspecified: Secondary | ICD-10-CM

## 2014-08-27 DIAGNOSIS — IMO0002 Reserved for concepts with insufficient information to code with codable children: Secondary | ICD-10-CM

## 2014-08-27 DIAGNOSIS — O3510X1 Maternal care for (suspected) chromosomal abnormality in fetus, unspecified, fetus 1: Secondary | ICD-10-CM

## 2014-08-27 DIAGNOSIS — O09522 Supervision of elderly multigravida, second trimester: Secondary | ICD-10-CM

## 2014-08-27 DIAGNOSIS — O351XX1 Maternal care for (suspected) chromosomal abnormality in fetus, fetus 1: Secondary | ICD-10-CM

## 2014-08-27 NOTE — Progress Notes (Signed)
Pt. Reports fetal movement. Abdominal u/s shows no FHR Pt. Would like D & E, but wants confirmatory u/s--called and discussed with Dr. Harolyn Rutherford

## 2014-08-27 NOTE — Progress Notes (Signed)
[facility] Attending Note  Called to discuss ultrasound results with patient; 53 y.V.[id number] at [redacted]w[redacted]d with recently diagnosed fetal demise in the setting of known Trisomy 18, cystic hygroma, marked fetal hydrops. Patient was informed of this diagnosis during her visit today at [facility] but she wanted a formal ultrasound.  Patient is accompanied by her daughter and son.    Diagnosis of IUFD measuring 14 week size with physical findings suggestive of Trisomy 18 was discussed again with patient. Appropriate support given to her and her family.  Discussed management of IUFD: expectant management vs D&E.  Risks and benefits of all modalities discussed; all questions answered.  Patient opted for D&E; she was scheduled on Thursday 08/29/14 at 1430 with Dr. Elly Modena. Preoperative instructions given to her; NPO after midnight, come to hospital around 1 pm on day of surgery.  All questions answered. Offered for patient to call [facility] for further support, she declines at this point.  Dr. Elly Modena informed of upcoming surgery.  Bleeding precautions reviewed with patient.   Verita Schneiders, MD, [place] Attending [place], [place]

## 2014-08-28 ENCOUNTER — Encounter (HOSPITAL_COMMUNITY): Payer: Self-pay | Admitting: *Deleted

## 2014-08-29 ENCOUNTER — Ambulatory Visit (HOSPITAL_COMMUNITY)
Admission: RE | Admit: 2014-08-29 | Discharge: 2014-08-29 | Disposition: A | Discharge status: Home / Self Care | Payer: 59 | Source: Ambulatory Visit | Attending: Obstetrics & Gynecology | Admitting: Obstetrics & Gynecology

## 2014-08-29 ENCOUNTER — Encounter (HOSPITAL_COMMUNITY)
Admission: RE | Disposition: A | Discharge status: Home / Self Care | Payer: Self-pay | Source: Ambulatory Visit | Attending: Obstetrics & Gynecology

## 2014-08-29 ENCOUNTER — Ambulatory Visit (HOSPITAL_COMMUNITY): Payer: 59 | Admitting: Anesthesiology

## 2014-08-29 DIAGNOSIS — K219 Gastro-esophageal reflux disease without esophagitis: Secondary | ICD-10-CM | POA: Diagnosis not present

## 2014-08-29 DIAGNOSIS — E785 Hyperlipidemia, unspecified: Secondary | ICD-10-CM | POA: Diagnosis not present

## 2014-08-29 DIAGNOSIS — F329 Major depressive disorder, single episode, unspecified: Secondary | ICD-10-CM | POA: Insufficient documentation

## 2014-08-29 DIAGNOSIS — M1990 Unspecified osteoarthritis, unspecified site: Secondary | ICD-10-CM | POA: Diagnosis not present

## 2014-08-29 DIAGNOSIS — K449 Diaphragmatic hernia without obstruction or gangrene: Secondary | ICD-10-CM | POA: Insufficient documentation

## 2014-08-29 DIAGNOSIS — O021 Missed abortion: Secondary | ICD-10-CM | POA: Diagnosis not present

## 2014-08-29 DIAGNOSIS — F17210 Nicotine dependence, cigarettes, uncomplicated: Secondary | ICD-10-CM | POA: Diagnosis not present

## 2014-08-29 HISTORY — PX: DILATION AND EVACUATION: SHX1459

## 2014-08-29 LAB — CBC
HCT: 39.5 % (ref 36.0–46.0)
Hemoglobin: 14.2 g/dL (ref 12.0–15.0)
MCH: 31.6 pg (ref 26.0–34.0)
MCHC: 35.9 g/dL (ref 30.0–36.0)
MCV: 88 fL (ref 78.0–100.0)
Platelets: 248 10*3/uL (ref 150–400)
RBC: 4.49 MIL/uL (ref 3.87–5.11)
RDW: 12.2 % (ref 11.5–15.5)
WBC: 8.3 10*3/uL (ref 4.0–10.5)

## 2014-08-29 SURGERY — DILATION AND EVACUATION, UTERUS, SECOND TRIMESTER
Anesthesia: Monitor Anesthesia Care | Site: Vagina

## 2014-08-29 MED ORDER — LIDOCAINE HCL (CARDIAC) 20 MG/ML IV SOLN
INTRAVENOUS | Status: DC | PRN
  Administered 2014-08-29 (×4): 50 mg via INTRAVENOUS

## 2014-08-29 MED ORDER — FENTANYL CITRATE 0.05 MG/ML IJ SOLN
INTRAMUSCULAR | Status: AC
  Administered 2014-08-29: 50 ug via INTRAVENOUS
  Filled 2014-08-29: qty 2

## 2014-08-29 MED ORDER — PROPOFOL 10 MG/ML IV EMUL
INTRAVENOUS | Status: DC | PRN
  Administered 2014-08-29: 50 mg via INTRAVENOUS

## 2014-08-29 MED ORDER — ONDANSETRON HCL 4 MG/2ML IJ SOLN
INTRAMUSCULAR | Status: AC
  Filled 2014-08-29: qty 2

## 2014-08-29 MED ORDER — ONDANSETRON HCL 4 MG/2ML IJ SOLN
INTRAMUSCULAR | Status: DC | PRN
  Administered 2014-08-29: 4 mg via INTRAVENOUS

## 2014-08-29 MED ORDER — PROPOFOL 10 MG/ML IV EMUL
INTRAVENOUS | Status: AC
  Filled 2014-08-29: qty 20

## 2014-08-29 MED ORDER — OXYCODONE-ACETAMINOPHEN 5-325 MG PO TABS: 1.0000 | ORAL_TABLET | ORAL | Status: DC | PRN

## 2014-08-29 MED ORDER — LIDOCAINE HCL 1 % IJ SOLN
INTRAMUSCULAR | Status: DC | PRN
  Administered 2014-08-29: 10 mL

## 2014-08-29 MED ORDER — ONDANSETRON HCL 4 MG/2ML IJ SOLN: 4.0000 mg | Freq: Once | INTRAMUSCULAR | Status: DC | PRN

## 2014-08-29 MED ORDER — LIDOCAINE HCL (CARDIAC) 20 MG/ML IV SOLN
INTRAVENOUS | Status: AC
  Filled 2014-08-29: qty 5

## 2014-08-29 MED ORDER — FENTANYL CITRATE 0.05 MG/ML IJ SOLN
25.0000 ug | INTRAMUSCULAR | Status: DC | PRN
  Administered 2014-08-29 (×2): 50 ug via INTRAVENOUS

## 2014-08-29 MED ORDER — KETOROLAC TROMETHAMINE 30 MG/ML IJ SOLN
INTRAMUSCULAR | Status: DC | PRN
  Administered 2014-08-29: 30 mg via INTRAVENOUS

## 2014-08-29 MED ORDER — FENTANYL CITRATE 0.05 MG/ML IJ SOLN
INTRAMUSCULAR | Status: AC
  Filled 2014-08-29: qty 2

## 2014-08-29 MED ORDER — IBUPROFEN 600 MG PO TABS: 600.0000 mg | ORAL_TABLET | Freq: Four times a day (QID) | ORAL | Status: DC | PRN

## 2014-08-29 MED ORDER — DEXAMETHASONE SODIUM PHOSPHATE 10 MG/ML IJ SOLN
INTRAMUSCULAR | Status: AC
  Filled 2014-08-29: qty 1

## 2014-08-29 MED ORDER — LACTATED RINGERS IV SOLN
INTRAVENOUS | Status: DC
  Administered 2014-08-29 (×2): via INTRAVENOUS

## 2014-08-29 MED ORDER — ACETAMINOPHEN 160 MG/5ML PO SOLN
ORAL | Status: AC
  Administered 2014-08-29: 650 mg via ORAL
  Filled 2014-08-29: qty 20.3

## 2014-08-29 MED ORDER — FENTANYL CITRATE 0.05 MG/ML IJ SOLN
INTRAMUSCULAR | Status: DC | PRN
  Administered 2014-08-29 (×2): 50 ug via INTRAVENOUS

## 2014-08-29 MED ORDER — SCOPOLAMINE 1 MG/3DAYS TD PT72
1.0000 | MEDICATED_PATCH | Freq: Once | TRANSDERMAL | Status: DC
  Administered 2014-08-29: 1.5 mg via TRANSDERMAL

## 2014-08-29 MED ORDER — OXYCODONE-ACETAMINOPHEN 5-325 MG PO TABS
1.0000 | ORAL_TABLET | Freq: Once | ORAL | Status: AC
  Administered 2014-08-29: 1 via ORAL

## 2014-08-29 MED ORDER — MIDAZOLAM HCL 2 MG/2ML IJ SOLN
INTRAMUSCULAR | Status: DC | PRN
  Administered 2014-08-29: 2 mg via INTRAVENOUS

## 2014-08-29 MED ORDER — DOCUSATE SODIUM 100 MG PO CAPS: 100.0000 mg | ORAL_CAPSULE | Freq: Two times a day (BID) | ORAL | Status: DC | PRN

## 2014-08-29 MED ORDER — PROPOFOL INFUSION 10 MG/ML OPTIME
INTRAVENOUS | Status: DC | PRN
Start: 2014-08-29 — End: 2014-08-29
  Administered 2014-08-29: 140 ug/kg/min via INTRAVENOUS

## 2014-08-29 MED ORDER — ACETAMINOPHEN 160 MG/5ML PO SOLN
650.0000 mg | Freq: Once | ORAL | Status: AC
  Administered 2014-08-29: 650 mg via ORAL

## 2014-08-29 MED ORDER — SCOPOLAMINE 1 MG/3DAYS TD PT72
MEDICATED_PATCH | TRANSDERMAL | Status: AC
  Filled 2014-08-29: qty 1

## 2014-08-29 MED ORDER — OXYCODONE-ACETAMINOPHEN 5-325 MG PO TABS
ORAL_TABLET | ORAL | Status: AC
  Filled 2014-08-29: qty 1

## 2014-08-29 MED ORDER — LIDOCAINE HCL 1 % IJ SOLN
INTRAMUSCULAR | Status: AC
  Filled 2014-08-29: qty 20

## 2014-08-29 MED ORDER — MIDAZOLAM HCL 2 MG/2ML IJ SOLN
INTRAMUSCULAR | Status: AC
  Filled 2014-08-29: qty 2

## 2014-08-29 MED ORDER — MEPERIDINE HCL 25 MG/ML IJ SOLN: 6.2500 mg | INTRAMUSCULAR | Status: DC | PRN

## 2014-08-29 MED ORDER — DEXAMETHASONE SODIUM PHOSPHATE 10 MG/ML IJ SOLN
INTRAMUSCULAR | Status: DC | PRN
  Administered 2014-08-29: 10 mg via INTRAVENOUS

## 2014-08-29 SURGICAL SUPPLY — 15 items
CLOTH BEACON ORANGE TIMEOUT ST (SAFETY) ×3 IMPLANT
CONT PATH 16OZ SNAP LID 3702 (MISCELLANEOUS) ×2 IMPLANT
GLOVE BIO SURGEON STRL SZ 6.5 (GLOVE) ×2 IMPLANT
GLOVE BIO SURGEONS STRL SZ 6.5 (GLOVE) ×1
GOWN STRL REUS W/TWL LRG LVL3 (GOWN DISPOSABLE) ×6 IMPLANT
KIT BERKELEY 2ND TRIMESTER 1/2 (COLLECTOR) ×3 IMPLANT
NS IRRIG 1000ML POUR BTL (IV SOLUTION) ×3 IMPLANT
PACK VAGINAL MINOR WOMEN LF (CUSTOM PROCEDURE TRAY) ×3 IMPLANT
PAD OB MATERNITY 4.3X12.25 (PERSONAL CARE ITEMS) ×3 IMPLANT
PAD PREP 24X48 CUFFED NSTRL (MISCELLANEOUS) ×3 IMPLANT
TOWEL OR 17X24 6PK STRL BLUE (TOWEL DISPOSABLE) ×6 IMPLANT
TUBE VACURETTE 2ND TRIMESTER (CANNULA) ×3 IMPLANT
VACURETTE 12 RIGID CVD (CANNULA) IMPLANT
VACURETTE 14MM CVD 1/2 BASE (CANNULA) IMPLANT
VACURETTE 16MM ASPIR CVD .5 (CANNULA) ×2 IMPLANT

## 2014-08-29 NOTE — Transfer of Care (Signed)
Immediate Anesthesia Transfer of Care Note  Patient: [name] [name]  Procedure(s) Performed: Procedure(s): DILATATION AND EVACUATION (D&E) 2ND TRIMESTER (N/A)  Patient Location: PACU  Anesthesia Type:MAC  Level of Consciousness: awake, alert  and oriented  Airway & Oxygen Therapy: Patient Spontanous Breathing and Patient connected to nasal cannula oxygen  Post-op Assessment: Report given to PACU RN and Post -op Vital signs reviewed and stable  Post vital signs: Reviewed and stable  Complications: No apparent anesthesia complications

## 2014-08-29 NOTE — Anesthesia Preprocedure Evaluation (Signed)
Anesthesia Evaluation  Patient identified by MRN, date of birth, ID band Patient awake    Reviewed: Allergy & Precautions, H&P , NPO status , Patient's Chart, lab work & pertinent test results  History of Anesthesia Complications Negative for: history of anesthetic complications  Airway Mallampati: III  TM Distance: >3 FB Neck ROM: Full    Dental no notable dental hx. (+) Dental Advisory Given   Pulmonary Current Smoker,  breath sounds clear to auscultation  Pulmonary exam normal       Cardiovascular negative cardio ROS  Rhythm:Regular Rate:Normal     Neuro/Psych PSYCHIATRIC DISORDERS Anxiety Depression negative neurological ROS     GI/Hepatic Neg liver ROS, hiatal hernia, GERD-  Medicated and Controlled,  Endo/Other  negative endocrine ROS  Renal/GU negative Renal ROS  negative genitourinary   Musculoskeletal  (+) Arthritis -, Osteoarthritis,    Abdominal   Peds negative pediatric ROS (+)  Hematology negative hematology ROS (+)   Anesthesia Other Findings   Reproductive/Obstetrics negative OB ROS                             Anesthesia Physical Anesthesia Plan  ASA: II  Anesthesia Plan: MAC   Post-op Pain Management:    Induction: Intravenous  Airway Management Planned: Nasal Cannula  Additional Equipment:   Intra-op Plan:   Post-operative Plan: Extubation in OR  Informed Consent: I have reviewed the patients History and Physical, chart, labs and discussed the procedure including the risks, benefits and alternatives for the proposed anesthesia with the patient or authorized representative who has indicated his/her understanding and acceptance.   Dental advisory given  Plan Discussed with: CRNA  Anesthesia Plan Comments:         Anesthesia Quick Evaluation

## 2014-08-29 NOTE — Discharge Instructions (Signed)
Dilation and Curettage or Vacuum Curettage, Care After Refer to this sheet in the next few weeks. These instructions provide you with information on caring for yourself after your procedure. Your health care provider may also give you more specific instructions. Your treatment has been planned according to current medical practices, but problems sometimes occur. Call your health care provider if you have any problems or questions after your procedure. WHAT TO EXPECT AFTER THE PROCEDURE After your procedure, it is typical to have light cramping and bleeding. This may last for 2 days to 2 weeks after the procedure. HOME CARE INSTRUCTIONS   Do not drive for 24 hours.  Wait 1 week before returning to strenuous activities.  Take your temperature 2 times a day for 4 days and write it down. Provide these temperatures to your health care provider if you develop a fever.  Avoid long periods of standing.  Avoid heavy lifting, pushing, or pulling. Do not lift anything heavier than 10 pounds (4.5 kg).  Limit stair climbing to once or twice a day.  Take rest periods often.  You may resume your usual diet.  Drink enough fluids to keep your urine clear or pale yellow.  Your usual bowel function should return. If you have constipation, you may:  Take a mild laxative with permission from your health care provider.  Add fruit and bran to your diet.  Drink more fluids.  Take showers instead of baths until your health care provider gives you permission to take baths.  Do not go swimming or use a hot tub until your health care provider approves.  Try to have someone with you or available to you the first 24-48 hours, especially if you were given a general anesthetic.  Do not douche, use tampons, or have sex (intercourse) for 2 weeks after the procedure.  Only take over-the-counter or prescription medicines as directed by your health care provider. Do not take aspirin. It can cause  bleeding.  Follow up with your health care provider as directed. SEEK MEDICAL CARE IF:   You have increasing cramps or pain that is not relieved with medicine.  You have abdominal pain that does not seem to be related to the same area of earlier cramping and pain.  You have bad smelling vaginal discharge.  You have a rash.  You are having problems with any medicine. SEEK IMMEDIATE MEDICAL CARE IF:   You have bleeding that is heavier than a normal menstrual period.  You have a fever.  You have chest pain.  You have shortness of breath.  You feel dizzy or feel like fainting.  You pass out.  You have pain in your shoulder strap area.  You have heavy vaginal bleeding with or without blood clots. MAKE SURE YOU:   Understand these instructions.  Will watch your condition.  Will get help right away if you are not doing well or get worse. Document Released: 08/27/2000 Document Revised: 09/04/2013 Document Reviewed: 03/29/2013 [facility] Patient Information 2015 [place], [place]. This information is not intended to replace advice given to you by your health care provider. Make sure you discuss any questions you have with your health care provider.  NO IBUPROFEN PRODUCTS (MOTRIN, ADVIL) OR ALEVE UNTIL 8:40PM TODAY.

## 2014-08-29 NOTE — Anesthesia Postprocedure Evaluation (Signed)
Anesthesia Post Note  Patient: [name] [name]  Procedure(s) Performed: Procedure(s) (LRB): DILATATION AND EVACUATION (D&E) 2ND TRIMESTER (N/A)  Anesthesia type: General  Patient location: PACU  Post pain: Pain level controlled  Post assessment: Post-op Vital signs reviewed  Last Vitals:  Filed Vitals:   08/29/14 1545  BP: 107/60  Pulse: 74  Temp: 36.7 C  Resp: 15    Post vital signs: Reviewed  Level of consciousness: sedated  Complications: No apparent anesthesia complications

## 2014-08-29 NOTE — H&P (Signed)
[name] [name] is an 43 y.o. female [id number] at 16 weeks by dates but diagnosed with IUFD secondary to T33 here for scheduled dilatation and evacuation. Patient with without any other complaints.   Menstrual History: Patient's last menstrual period was 05/06/2014.    Past Medical History  Diagnosis Date  . Hyperlipidemia   . Cystitis 07/2008    under care of [facility]  . S/P dilatation of esophageal stricture 2006-2008  . Plantar fasciitis     s/p left gastroc slide   . Pain, dental 2010    history - teeth ext per patient  . Macular degeneration 2013    Age related per pt - no meds  . Arthritis     bulging disc L4/5  L5/S1  . Hepatitis     History Hep A at age 68 yrs - no prev problems  . H/O hiatal hernia     no meds - diet controlled  . Depression     history - no meds  . Anxiety     history - no meds    Past Surgical History  Procedure Laterality Date  . Pancystourethroscopy    . Dilatation of esophageal stricture  2005-2008  . Gastroc slide  2005    left side   . Cesarean section      1991  . Svd  1999    x 1  . Plantar fasciitis      left foot  . Cholecystectomy  1991  . Colonoscopy    . Dilation and evacuation  02/09/2012    Procedure: DILATATION AND EVACUATION;  Surgeon: Osborne Oman, MD;  Location: [facility];  Service: Gynecology;  Laterality: N/A;  With ultrasound guidance    Family History  Problem Relation Age of Onset  . Diabetes Mother   . Hyperlipidemia Mother   . Stroke Mother   . Hypertension Mother     Social History:  reports that she has been smoking Cigarettes.  She has a 2 pack-year smoking history. She has never used smokeless tobacco. She reports that she drinks alcohol. She reports that she does not use illicit drugs.  Allergies:  Allergies  Allergen Reactions  . Azithromycin     REACTION: stomach pain  . Penicillins     REACTION: Rash/ Nausea    Prescriptions prior to admission  Medication Sig Dispense Refill Last Dose    . Prenatal Vit-Fe Fumarate-FA (MULTIVITAMIN-PRENATAL) 27-0.8 MG TABS tablet Take 1 tablet by mouth daily at 12 noon.   Past Week at Unknown time    Review of Systems  All other systems reviewed and are negative.   Blood pressure 119/70, pulse 105, temperature 99.1 F (37.3 C), temperature source Oral, resp. rate 18, height 5' (1.524 m), last menstrual period 05/06/2014, SpO2 99 %. Physical Exam  GENERAL: Well-developed, well-nourished female in no acute distress.  HEENT: Normocephalic, atraumatic. Sclerae anicteric.  NECK: Supple. Normal thyroid.  LUNGS: Clear to auscultation bilaterally.  HEART: Regular rate and rhythm. ABDOMEN: Soft, nontender, nondistended. No organomegaly. PELVIC: Deferred to OR EXTREMITIES: No cyanosis, clubbing, or edema, 2+ distal pulses.   No results found for this or any previous visit (from the past 24 hour(s)).  US Ob Limited  08/27/2014   OBSTETRICAL ULTRASOUND: This exam was performed within a [facility]. The OB US report was generated in the [facility] system, and faxed to the ordering physician.   This report is available in the [employer]. See the [facility] Obstetric US report  via the Image Link.                                    Expand All Collapse All                     Assessment/Plan: 43 yo with IUFD at [redacted]w[redacted]d secondary to T38 who opted for surgical management - Risks, benefits and alternatives were explained including but not limited to risks of bleeding, infection, uterine perforation and damage to adjacent organs. Patient verbalized understanding and all questions were answered.  Naman Spychalski 08/29/2014, 2:12 PM

## 2014-08-29 NOTE — Op Note (Signed)
[name] [name] PROCEDURE DATE: 08/29/2014  PREOPERATIVE DIAGNOSIS: 16 week missed abortion. POSTOPERATIVE DIAGNOSIS: The same. PROCEDURE:     Dilation and Evacuation. SURGEON:  Dr. Mora Bellman  INDICATIONS: 43 y.o. G4P2012with MAB at [redacted] weeks gestation with Trisomy 18, needing surgical completion.  Risks of surgery were discussed with the patient including but not limited to: bleeding which may require transfusion; infection which may require antibiotics; injury to uterus or surrounding organs;need for additional procedures including laparotomy or laparoscopy; possibility of intrauterine scarring which may impair future fertility; and other postoperative/anesthesia complications. Written informed consent was obtained.    FINDINGS:  A 16-week size midlineuterus, moderate amounts of products of conception (all 4 extremities and calvarium identified), specimen sent to pathology.  ANESTHESIA:    Monitored intravenous sedation, paracervical block. INTRAVENOUS FLUIDS:  800 ml of LR ESTIMATED BLOOD LOSS:  Less than 50 ml. SPECIMENS:  Products of conception sent to pathology COMPLICATIONS:  None immediate.  PROCEDURE DETAILS:  The patient received intravenous antibiotics while in the preoperative area.  She was then taken to the operating room where general anesthesia was administered and was found to be adequate.  After an adequate timeout was performed, she was placed in the dorsal lithotomy position and examined; then prepped and draped in the sterile manner.   Her bladder was catheterized for an unmeasured amount of clear, yellow urine. A vaginal speculum was then placed in the patient's vagina and a single tooth tenaculum was applied to the anterior lip of the cervix.  A paracervical block using 0.5% Marcaine was administered. The cervix was gently dilated to accommodate a 16 mm suction curette that was gently advanced to the uterine fundus.  The suction device was then activated and curette slowly  rotated to clear the uterus of products of conception.  A sharp curettage was then performed to confirm complete emptying of the uterus. There was minimal bleeding noted and the tenaculum removed with good hemostasis noted.   All instruments were removed from the patient's vagina. The patient tolerated the procedure well and was taken to the recovery area awake, and in stable condition.  The patient will be discharged to home as per PACU criteria.  Routine postoperative instructions given.  She was prescribed Percocet, Ibuprofen and Colace.  She will follow up in the clinic in 2 weeks for postoperative evaluation.

## 2014-08-30 ENCOUNTER — Encounter (HOSPITAL_COMMUNITY): Payer: Self-pay | Admitting: Obstetrics and Gynecology

## 2014-08-30 ENCOUNTER — Ambulatory Visit (HOSPITAL_COMMUNITY): Payer: Medicaid Other

## 2014-09-05 ENCOUNTER — Inpatient Hospital Stay (HOSPITAL_COMMUNITY): Payer: 59

## 2014-09-05 ENCOUNTER — Inpatient Hospital Stay (HOSPITAL_COMMUNITY)
Admission: AD | Admit: 2014-09-05 | Discharge: 2014-09-06 | Disposition: A | Discharge status: Home / Self Care | Payer: 59 | Source: Ambulatory Visit | Attending: Obstetrics & Gynecology | Admitting: Obstetrics & Gynecology

## 2014-09-05 ENCOUNTER — Encounter (HOSPITAL_COMMUNITY): Payer: Self-pay | Admitting: *Deleted

## 2014-09-05 DIAGNOSIS — F17210 Nicotine dependence, cigarettes, uncomplicated: Secondary | ICD-10-CM | POA: Diagnosis not present

## 2014-09-05 DIAGNOSIS — N939 Abnormal uterine and vaginal bleeding, unspecified: Secondary | ICD-10-CM

## 2014-09-05 DIAGNOSIS — O036 Delayed or excessive hemorrhage following complete or unspecified spontaneous abortion: Secondary | ICD-10-CM | POA: Diagnosis not present

## 2014-09-05 LAB — CBC WITH DIFFERENTIAL/PLATELET
BASOS ABS: 0 10*3/uL (ref 0.0–0.1)
BASOS PCT: 0 % (ref 0–1)
Eosinophils Absolute: 0.2 10*3/uL (ref 0.0–0.7)
Eosinophils Relative: 2 % (ref 0–5)
HCT: 36.3 % (ref 36.0–46.0)
HEMOGLOBIN: 13 g/dL (ref 12.0–15.0)
Lymphocytes Relative: 38 % (ref 12–46)
Lymphs Abs: 3.3 10*3/uL (ref 0.7–4.0)
MCH: 31.9 pg (ref 26.0–34.0)
MCHC: 35.8 g/dL (ref 30.0–36.0)
MCV: 89.2 fL (ref 78.0–100.0)
Monocytes Absolute: 0.7 10*3/uL (ref 0.1–1.0)
Monocytes Relative: 9 % (ref 3–12)
NEUTROS PCT: 51 % (ref 43–77)
Neutro Abs: 4.5 10*3/uL (ref 1.7–7.7)
Platelets: 273 10*3/uL (ref 150–400)
RBC: 4.07 MIL/uL (ref 3.87–5.11)
RDW: 12.2 % (ref 11.5–15.5)
WBC: 8.7 10*3/uL (ref 4.0–10.5)

## 2014-09-05 MED ORDER — IBUPROFEN 800 MG PO TABS
800.0000 mg | ORAL_TABLET | Freq: Four times a day (QID) | ORAL | Status: DC | PRN
  Filled 2014-09-05: qty 1

## 2014-09-05 NOTE — MAU Provider Note (Signed)
CC: No chief complaint on file.    First Provider Initiated Contact with Patient 09/05/14 2215      HPI [name] [name] is a 43 y.o. U2P5361 s/p D&E 08/29/14 for IUFD secondary to T18 at [redacted] weeks gestation. She had only light brownish bleeding until today when she began bleeding heavily and passing clots associated with lower abdominal cramping. Bleeding became redder since last night and is heavier than usual menses; saturated approximately 10 pads today. Denies passing tissue. Feels weak but denies orthostatic symptoms. Has been taking ibuprofen for pain.   Past Medical History  Diagnosis Date  . Hyperlipidemia   . Cystitis 07/2008    under care of [facility]  . S/P dilatation of esophageal stricture 2006-2008  . Plantar fasciitis     s/p left gastroc slide   . Pain, dental 2010    history - teeth ext per patient  . Macular degeneration 2013    Age related per pt - no meds  . Arthritis     bulging disc L4/5  L5/S1  . Hepatitis     History Hep A at age 2 yrs - no prev problems  . H/O hiatal hernia     no meds - diet controlled  . Depression     history - no meds  . Anxiety     history - no meds    OB History  Gravida Para Term Preterm AB SAB TAB Ectopic Multiple Living  4 2 2  1 1    2     # Outcome Date GA Lbr Len/2nd Weight Sex Delivery Anes PTL Lv  4 Term 12/27/97 [redacted]w[redacted]d  3.204 kg (7 lb 1 oz) M VBAC EPI  Y  3 Term 06/10/90 [redacted]w[redacted]d  2.551 kg (5 lb 10 oz) F CS-Unspec Spinal  Y     Comments: had pre-elcampsia  2 Gravida           1 SAB               Past Surgical History  Procedure Laterality Date  . Pancystourethroscopy    . Dilatation of esophageal stricture  2005-2008  . Gastroc slide  2005    left side   . Cesarean section      1991  . Svd  1999    x 1  . Plantar fasciitis      left foot  . Cholecystectomy  1991  . Colonoscopy    . Dilation and evacuation  02/09/2012    Procedure: DILATATION AND EVACUATION;  Surgeon: Osborne Oman, MD;  Location:  [facility];  Service: Gynecology;  Laterality: N/A;  With ultrasound guidance  . Dilation and evacuation N/A 08/29/2014    Procedure: DILATATION AND EVACUATION (D&E) 2ND TRIMESTER;  Surgeon: Mora Bellman, MD;  Location: [facility];  Service: Gynecology;  Laterality: N/A;    History   Social History  . Marital Status: Single    Spouse Name: N/A    Number of Children: N/A  . Years of Education: N/A   Occupational History  . Not on file.   Social History Main Topics  . Smoking status: Current Every Day Smoker -- 0.25 packs/day for 8 years    Types: Cigarettes  . Smokeless tobacco: Never Used     Comment: 4-5 cigs/day  . Alcohol Use: No     Comment: rarely  . Drug Use: No  . Sexual Activity: Yes    Birth Control/ Protection: None   Other Topics Concern  .  Not on file   Social History Narrative   Current smoker 1/2 PPD   Denies alcohol use   2 children     No current facility-administered medications on file prior to encounter.   Current Outpatient Prescriptions on File Prior to Encounter  Medication Sig Dispense Refill  . docusate sodium (COLACE) 100 MG capsule Take 1 capsule (100 mg total) by mouth 2 (two) times daily as needed. 30 capsule 2  . ibuprofen (ADVIL,MOTRIN) 600 MG tablet Take 1 tablet (600 mg total) by mouth every 6 (six) hours as needed. 30 tablet 1  . oxyCODONE-acetaminophen (PERCOCET/ROXICET) 5-325 MG per tablet Take 1 tablet by mouth every 4 (four) hours as needed. 20 tablet 0  . Prenatal Vit-Fe Fumarate-FA (MULTIVITAMIN-PRENATAL) 27-0.8 MG TABS tablet Take 1 tablet by mouth daily at 12 noon.      Allergies  Allergen Reactions  . Azithromycin     REACTION: stomach pain  . Penicillins     REACTION: Rash/ Nausea    ROS Pertinent items in HPI  PHYSICAL EXAM Filed Vitals:   09/05/14 2156  BP: 137/78  Pulse: 96  Temp: 99 F (37.2 C)  Resp: 18   General: Well nourished, well developed female, looks uncomfortable Cardiovascular: Normal  rate Respiratory: Normal effort Abdomen: Soft, mild-mod tenderness mid suprapubic area Back: No CVAT Extremities: No edema Neurologic: Alert and oriented Speculum exam: NEFG; vagina with moderate amount red blood; small clot removed from cx with sponge-stick Bimanual exam: cervix closed, no CMT; uterus 10-12 wk size   LAB RESULTS Results for orders placed or performed during the hospital encounter of 09/05/14 (from the past 24 hour(s))  CBC with Differential     Status: None   Collection Time: 09/05/14 10:37 PM  Result Value Ref Range   WBC 8.7 4.0 - 10.5 K/uL   RBC 4.07 3.87 - 5.11 MIL/uL   Hemoglobin 13.0 12.0 - 15.0 g/dL   HCT 36.3 36.0 - 46.0 %   MCV 89.2 78.0 - 100.0 fL   MCH 31.9 26.0 - 34.0 pg   MCHC 35.8 30.0 - 36.0 g/dL   RDW 12.2 11.5 - 15.5 %   Platelets 273 150 - 400 K/uL   Neutrophils Relative % 51 43 - 77 %   Neutro Abs 4.5 1.7 - 7.7 K/uL   Lymphocytes Relative 38 12 - 46 %   Lymphs Abs 3.3 0.7 - 4.0 K/uL   Monocytes Relative 9 3 - 12 %   Monocytes Absolute 0.7 0.1 - 1.0 K/uL   Eosinophils Relative 2 0 - 5 %   Eosinophils Absolute 0.2 0.0 - 0.7 K/uL   Basophils Relative 0 0 - 1 %   Basophils Absolute 0.0 0.0 - 0.1 K/uL    IMAGING Specimen(s) Obtained: Products of Conception Specimen Clinical Information missed abortion at sixteen weeks (kp) Gross Received in formalin are 7 x 7 x 3 cm of tan-red spongy membranous tissue and grossly identifiable fetal parts. There is an intact foot measuring 1.1 cm in length consistent with an gestational age of [redacted] weeks. A section is submitted. (GRP:ecj 08/29/2074) Report signed out from the following location(s) Technical Component performed at [facility]. [address],[place] [address].[id number]., Interpretation performed at [employer] [place], [place], [place] [address]. CLIA#: [id number] Study Result     CLINICAL DATA: Vaginal bleeding,  dilatation and evacuation for Trisomy 18 fetus with 16 week intrauterine fetal demise 08/29/2014  EXAM: TRANSABDOMINAL AND TRANSVAGINAL ULTRASOUND OF  PELVIS  TECHNIQUE: Both transabdominal and transvaginal ultrasound examinations of the pelvis were performed. Transabdominal technique was performed for global imaging of the pelvis including uterus, ovaries, adnexal regions, and pelvic cul-de-sac. It was necessary to proceed with endovaginal exam following the transabdominal exam to visualize the endometrium and ovaries.  COMPARISON: 08/27/2014  FINDINGS: Uterus  Measurements: 10.0 x 7.4 x 5.6 cm, anteverted, anteflexed. No fibroids or other mass visualized.  Endometrium  Thickness: 2.2 cm, irregularly thickened with a focus of internal blood flow. No intrauterine gestational sac identified.  Right ovary  Measurements: Not visualized. No adnexal mass  Left ovary  Measurements: Not visualized. No adnexal mass  Other findings  Small amount of fluid in the cul-de-sac  IMPRESSION: Thickened and irregular endometrial stripe with a focus of internal color flow, favored to represent retained products of conception although debris/blood product could appear similar.   Electronically Signed  By: Conchita Paris M.D.  On: 09/05/2014 23:21     MAU COURSE Percocet 5/325mg  2 po given with relief. Declined Toradol. Dr, Roselie Awkward in to talk with patient. Discussed managemnet options and recommendations> pt elects cytotec  ASSESSMENT  1. Vaginal bleeding   POD #* D&E Possible RPOC  PLAN Discharge home. See AVS for patient education.    Medication List    STOP taking these medications        multivitamin-prenatal 27-0.8 MG Tabs tablet      TAKE these medications        docusate sodium 100 MG capsule  Commonly known as:  COLACE  Take 1 capsule (100 mg total) by mouth 2 (two) times daily as needed.     ibuprofen 600 MG tablet  Commonly known  as:  ADVIL,MOTRIN  Take 1 tablet (600 mg total) by mouth every 6 (six) hours as needed.     misoprostol 200 MCG tablet  Commonly known as:  CYTOTEC  Take all 4 tablets po at once     oxyCODONE-acetaminophen 5-325 MG per tablet  Commonly known as:  PERCOCET/ROXICET  Take 1 tablet by mouth every 4 (four) hours as needed.       Follow-up Information    Follow up with [facility] On 09/18/2014.   Why:  Keep your scheduled follow up appointment   Contact information:   [place] [address] [phone]      I will call pt on 09/09/14 to monitor condition    Lorene Dy, CNM 09/05/2014 10:17 PM

## 2014-09-05 NOTE — MAU Note (Signed)
Pt reports she had a D&E one week ago for 16 week fetal demise. States she has only had spotting until today and she started having heavy bleeding and passing clots. Also reports increased cramping today.

## 2014-09-06 DIAGNOSIS — N939 Abnormal uterine and vaginal bleeding, unspecified: Secondary | ICD-10-CM

## 2014-09-06 MED ORDER — OXYCODONE-ACETAMINOPHEN 5-325 MG PO TABS
1.0000 | ORAL_TABLET | ORAL | Status: DC | PRN
Start: 2014-09-06 — End: 2014-09-18

## 2014-09-06 MED ORDER — OXYCODONE-ACETAMINOPHEN 5-325 MG PO TABS: 1.0000 | ORAL_TABLET | ORAL | Status: DC | PRN

## 2014-09-06 MED ORDER — MISOPROSTOL 200 MCG PO TABS: ORAL_TABLET | ORAL | Status: DC

## 2014-09-06 MED ORDER — OXYCODONE-ACETAMINOPHEN 5-325 MG PO TABS
1.0000 | ORAL_TABLET | Freq: Four times a day (QID) | ORAL | Status: DC | PRN
  Administered 2014-09-06: 2 via ORAL
  Filled 2014-09-06: qty 2

## 2014-09-06 MED ORDER — KETOROLAC TROMETHAMINE 30 MG/ML IJ SOLN: 30.0000 mg | Freq: Once | INTRAMUSCULAR | Status: DC

## 2014-09-06 NOTE — Discharge Instructions (Signed)
Incomplete Miscarriage A miscarriage is the sudden loss of an unborn baby (fetus) before the 20th week of pregnancy. In an incomplete miscarriage, parts of the fetus or placenta (afterbirth) remain in the body.  Having a miscarriage can be an emotional experience. Talk with your health care provider about any questions you may have about miscarrying, the grieving process, and your future pregnancy plans. CAUSES   Problems with the fetal chromosomes that make it impossible for the baby to develop normally. Problems with the baby's genes or chromosomes are most often the result of errors that occur by chance as the embryo divides and grows. The problems are not inherited from the parents.  Infection of the cervix or uterus.  Hormone problems.  Problems with the cervix, such as having an incompetent cervix. This is when the tissue in the cervix is not strong enough to hold the pregnancy.  Problems with the uterus, such as an abnormally shaped uterus, uterine fibroids, or congenital abnormalities.  Certain medical conditions.  Smoking, drinking alcohol, or taking illegal drugs.  Trauma. SYMPTOMS   Vaginal bleeding or spotting, with or without cramps or pain.  Pain or cramping in the abdomen or lower back.  Passing fluid, tissue, or blood clots from the vagina. DIAGNOSIS  Your health care provider will perform a physical exam. You may also have an ultrasound to confirm the miscarriage. Blood or urine tests may also be ordered. TREATMENT   Usually, a dilation and curettage (D&C) procedure is performed. During a D&C procedure, the cervix is widened (dilated) and any remaining fetal or placental tissue is gently removed from the uterus.  Antibiotic medicines are prescribed if there is an infection. Other medicines may be given to reduce the size of the uterus (contract) if there is a lot of bleeding.  If you have Rh negative blood and your baby was Rh positive, you will need a Rho (D)  immune globulin shot. This shot will protect any future baby from having Rh blood problems in future pregnancies.  You may be confined to bed rest. This means you should stay in bed and only get up to use the bathroom. HOME CARE INSTRUCTIONS   Rest as directed by your health care provider.  Restrict activity as directed by your health care provider. You may be allowed to continue light activity if curettage was not done but you require further treatment.  Keep track of the number of pads you use each day. Keep track of how soaked (saturated) they are. Record this information.  Do not  use tampons.  Do not douche or have sexual intercourse until approved by your health care provider.  Keep all follow-up appointments for reevaluation and continuing management.  Only take over-the-counter or prescription medicines for pain, fever, or discomfort as directed by your health care provider.  Take antibiotic medicine as directed by your health care provider. Make sure you finish it even if you start to feel better. SEEK IMMEDIATE MEDICAL CARE IF:   You experience severe cramps in your stomach, back, or abdomen.  You have an unexplained temperature (make sure to record these temperatures).  You pass large clots or tissue (save these for your health care provider to inspect).  Your bleeding increases.  You become light-headed, weak, or have fainting episodes. MAKE SURE YOU:   Understand these instructions.  Will watch your condition.  Will get help right away if you are not doing well or get worse. Document Released: 08/30/2005 Document Revised: 01/14/2014 Document Reviewed:   03/29/2013 [employer] Patient Information 2015 [employer]. This information is not intended to replace advice given to you by your health care provider. Make sure you discuss any questions you have with your health care provider.  

## 2014-09-09 ENCOUNTER — Encounter: Payer: Self-pay | Admitting: Internal Medicine

## 2014-09-09 ENCOUNTER — Ambulatory Visit (INDEPENDENT_AMBULATORY_CARE_PROVIDER_SITE_OTHER): Payer: 59 | Admitting: Internal Medicine

## 2014-09-09 VITALS — BP 144/65 | HR 97 | Temp 98.6°F | Wt 141.3 lb

## 2014-09-09 DIAGNOSIS — Z634 Disappearance and death of family member: Secondary | ICD-10-CM | POA: Insufficient documentation

## 2014-09-09 DIAGNOSIS — Z9889 Other specified postprocedural states: Secondary | ICD-10-CM

## 2014-09-09 DIAGNOSIS — M26609 Unspecified temporomandibular joint disorder, unspecified side: Secondary | ICD-10-CM | POA: Insufficient documentation

## 2014-09-09 DIAGNOSIS — G4700 Insomnia, unspecified: Secondary | ICD-10-CM | POA: Insufficient documentation

## 2014-09-09 DIAGNOSIS — M2660 Temporomandibular joint disorder, unspecified: Secondary | ICD-10-CM

## 2014-09-09 MED ORDER — NAPROXEN 250 MG PO TABS
250.0000 mg | ORAL_TABLET | Freq: Two times a day (BID) | ORAL | Status: DC
Start: 2014-09-09 — End: 2016-09-22

## 2014-09-09 MED ORDER — ZOLPIDEM TARTRATE 5 MG PO TABS: 5.0000 mg | ORAL_TABLET | Freq: Every evening | ORAL | Status: DC | PRN

## 2014-09-09 NOTE — Patient Instructions (Signed)
General Instructions: -Start taking Naproxen 250mg  twice per day with food to improve the inflammation of your jaw joint.  -You may take Ambien before bedtime to help you sleep.  -Please call the counseling line for further help.  -Follow up with your OB/GYN doctor next week.  -Follow up with [place] in 3 months or sooner as needed.    Please bring your medicines with you each time you come to clinic.  Medicines may include prescription medications, over-the-counter medications, herbal remedies, eye drops, vitamins, or other pills.   Progress Toward Treatment Goals:  Treatment Goal 11/23/2012  Stop smoking smoking less    Self Care Goals & Plans:  Self Care Goal 06/07/2014  Manage my medications take my medicines as prescribed; bring my medications to every visit; refill my medications on time  Eat healthy foods drink diet soda or water instead of juice or soda; eat more vegetables; eat foods that are low in salt; eat baked foods instead of fried foods; eat fruit for snacks and desserts  Stop smoking -    No flowsheet data found.   Care Management & Community Referrals:  No flowsheet data found.   Temporomandibular Problems  Temporomandibular joint (TMJ) dysfunction means there are problems with the joint between your jaw and your skull. This is a joint lined by cartilage like other joints in your body but also has a small disc in the joint which keeps the bones from rubbing on each other. These joints are like other joints and can get inflamed (sore) from arthritis and other problems. When this joint gets sore, it can cause headaches and pain in the jaw and the face. CAUSES  Usually the arthritic types of problems are caused by soreness in the joint. Soreness in the joint can also be caused by overuse. This may come from grinding your teeth. It may also come from mis-alignment in the joint. DIAGNOSIS Diagnosis of this condition can often be made by history and exam. Sometimes your  caregiver may need X-rays or an MRI scan to determine the exact cause. It may be necessary to see your dentist to determine if your teeth and jaws are lined up correctly. TREATMENT  Most of the time this problem is not serious; however, sometimes it can persist (become chronic). When this happens medications that will cut down on inflammation (soreness) help. Sometimes a shot of cortisone into the joint will be helpful. If your teeth are not aligned it may help for your dentist to make a splint for your mouth that can help this problem. If no physical problems can be found, the problem may come from tension. If tension is found to be the cause, biofeedback or relaxation techniques may be helpful. HOME CARE INSTRUCTIONS   Later in the day, applications of ice packs may be helpful. Ice can be used in a plastic bag with a towel around it to prevent frostbite to skin. This may be used about every 2 hours for 20 to 30 minutes, as needed while awake, or as directed by your caregiver.  Only take over-the-counter or prescription medicines for pain, discomfort, or fever as directed by your caregiver.  If physical therapy was prescribed, follow your caregiver's directions.  Wear mouth appliances as directed if they were given. Document Released: 05/25/2001 Document Revised: 11/22/2011 Document Reviewed: 09/01/2008 [facility] Patient Information 2015 [place], [place]. This information is not intended to replace advice given to you by your health care provider. Make sure you discuss any questions you have  with your health care provider.  Grief Reaction Grief is a normal response to the death of someone close to you. Feelings of fear, anger, and guilt can affect almost everyone who loses someone they love. Symptoms of depression are also common. These include problems with sleep, loss of appetite, and lack of energy. These grief reaction symptoms often last for weeks to months after a loss. They may also return  during special times that remind you of the person you lost, such as an anniversary or birthday. Anxiety, insomnia, irritability, and deep depression may last beyond the period of normal grief. If you experience these feelings for 6 months or longer, you may have clinical depression. Clinical depression requires further medical attention. If you think that you have clinical depression, you should contact your caregiver. If you have a history of depression or a family history of depression, you are at greater risk of clinical depression. You are also at greater risk of developing clinical depression if the loss was traumatic or the loss was of someone with whom you had unresolved issues.  A grief reaction can become complicated by being blocked. This means being unable to cry or express extreme emotions. This may prolong the grieving period and worsen the emotional effects of the loss. Mourning is a natural event in human life. A healthy grief reaction is one that is not blocked. It requires a time of sadness and readjustment. It is very important to share your sorrow and fear with others, especially close friends and family. Professional counselors and clergy can also help you process your grief. Document Released: 08/30/2005 Document Revised: 01/14/2014 Document Reviewed: 05/10/2006 [facility] Patient Information 2015 [place], [place]. This information is not intended to replace advice given to you by your health care provider. Make sure you discuss any questions you have with your health care provider.

## 2014-09-09 NOTE — Progress Notes (Signed)
   Subjective:    Patient ID: [name] [name], female    DOB: [birth year], 43 y.o.   MRN: [id number]  HPI  [name] [name] is a 43 yr old woman with PMH of depression, anxiety, tobacco use, with recent D&E (on 08/29/14) during second trimester pregnancy due to fetal chromosomal abnormality and fetal demise who presents for evaluation of right ear pain with no drainage. She also had hospitalization on 09/05/14 for vaginal bleeding post D&E with discharge to home on the same day.  Her right ear pain started about 10 days ago. She has no fever/chills, or decreased hearing.  She is very sad about her recent pregnancy loss and has had insomnia and difficulty sleeping. She denies SI/HI.   Review of Systems  Constitutional: Positive for appetite change. Negative for fever, chills, diaphoresis, fatigue and unexpected weight change.  HENT: Negative for congestion, hearing loss and rhinorrhea.   Respiratory: Negative for cough and shortness of breath.   Cardiovascular: Negative for chest pain and leg swelling.  Genitourinary: Positive for vaginal bleeding.       Vaginal bleeding that is diminishing   Neurological: Negative for dizziness and light-headedness.  Psychiatric/Behavioral: Positive for sleep disturbance. Negative for suicidal ideas, hallucinations, behavioral problems, confusion, self-injury and agitation. The patient is not hyperactive.        Objective:   Physical Exam  Constitutional: She is oriented to person, place, and time. She appears well-developed and well-nourished. No distress.  HENT:  Head: Normocephalic.  Right Ear: External ear normal.  Left Ear: External ear normal.  Mouth/Throat: Oropharynx is clear and moist. No oropharyngeal exudate.  Has clicking of right TMJ with TTP palpation.  No tenderness of tragus. Small papule of right upper ear lobe that is healing with no fluctuance or surrounding edema/erythema.   Eyes: Conjunctivae are normal.  Cardiovascular: Normal rate.     Pulmonary/Chest: Effort normal. No respiratory distress.  Lymphadenopathy:    She has no cervical adenopathy.  Neurological: She is alert and oriented to person, place, and time. Coordination normal.  Skin: Skin is warm and dry. She is not diaphoretic.  Psychiatric:  Tearful at times when talking about her recent pregnancy loss. Calm, pleasant. Mood is "sad". Affect is congruent.  Denies SI/HI   Nursing note and vitals reviewed.      Assessment & Plan:

## 2014-09-09 NOTE — Assessment & Plan Note (Signed)
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 She does not have many people she can talk to but has been given a number for counseling face-to-face or over-the-phone. She wants to avoid support groups at this time.  -She was advised to follow up with her OB/GYN next week and request a formal referral for counseling if she is not able to get help otherwise.  She denies SI/HI but was counseled on seeking immediate help if she has thought of harming herself or others and she agrees to this plan.

## 2014-09-09 NOTE — Assessment & Plan Note (Signed)
Due to grieving.   Will Rx short course of Ambien 5mg  qHS PRN. She has appointment with her OB/GYN next week and will further discuss need for sleep medications.

## 2014-09-09 NOTE — Assessment & Plan Note (Addendum)
Of 10 days duration, likely due to her recent stress from her pregnancy loss. Has not been to her dentist recently but needs dental work. She plans to go to her dentist soon to have dental work done.  Counseled her on relaxing exercises, counseling.  Her uterine bleeding is almost completely subsided with no hx of CKD.  -Rx naproxen 250mg  BID for 10 days, pt to avoid triggers such as eating crunchy foods.

## 2014-09-10 NOTE — Progress Notes (Signed)
Medicine attending: Medical history, presenting problems, physical findings, and medications, reviewed with Dr Kennerly on the day of the patient encounter and I concur with her evaluation and management plan. 

## 2014-09-18 ENCOUNTER — Encounter: Payer: Self-pay | Admitting: Obstetrics and Gynecology

## 2014-09-18 ENCOUNTER — Ambulatory Visit (INDEPENDENT_AMBULATORY_CARE_PROVIDER_SITE_OTHER): Payer: 59 | Admitting: Obstetrics and Gynecology

## 2014-09-18 VITALS — BP 127/77 | HR 90 | Wt 143.0 lb

## 2014-09-18 DIAGNOSIS — G4700 Insomnia, unspecified: Secondary | ICD-10-CM

## 2014-09-18 DIAGNOSIS — Z9889 Other specified postprocedural states: Secondary | ICD-10-CM

## 2014-09-18 MED ORDER — ZOLPIDEM TARTRATE 5 MG PO TABS: 5.0000 mg | ORAL_TABLET | Freq: Every evening | ORAL | Status: AC | PRN

## 2014-09-18 NOTE — Progress Notes (Signed)
Patient ID: [name] [name], female   DOB: 1971/01/04, 44 y.o.   MRN: [id number] 44 yo [id number] s/p D&E on 08/29/2014 secondary to IUFD at 28 weeks. Patient reports some heavy bleeding with passage of clots a week after the procedure and was treated with cytotec. She reports decreased to absent vaginal bleeding since. Patient is coping fairly well with the loss and reports a lot of family support. She is ready to return to work. She reports some difficulty sleeping and is requesting a prescription  GENERAL: Well-developed, well-nourished female in no acute distress.  HEENT: Normocephalic, atraumatic. Sclerae anicteric.  ABDOMEN: Soft, nontender, nondistended. No organomegaly. PELVIC: Normal external female genitalia. Vagina is pink and rugated.  Normal discharge. Normal appearing cervix. Uterus is normal in size. No adnexal mass or tenderness. EXTREMITIES: No cyanosis, clubbing, or edema, 2+ distal pulses.  A/P 44 yo s/p D&E on 08/29/2014 here for post op check - patient is medically cleared to resume all activities of daily living - Rx Ambien provided - patient is not interested in contraception at this time. She may consider genetic counseling prior to conception if she tries to conceive again. - RTC prn

## 2015-01-03 ENCOUNTER — Other Ambulatory Visit: Payer: Self-pay | Admitting: Obstetrics and Gynecology

## 2015-01-12 ENCOUNTER — Other Ambulatory Visit: Payer: Self-pay | Admitting: Obstetrics and Gynecology

## 2015-01-13 NOTE — Telephone Encounter (Signed)
Opened in error. This is a [facility] patient.

## 2015-01-23 ENCOUNTER — Encounter: Payer: Self-pay | Admitting: *Deleted

## 2015-09-24 IMAGING — US US TRANSVAGINAL NON-OB
1 series · 13 of 25 positions shown · non-contrast
Comparison: 08/27/2014

CLINICAL DATA: Vaginal bleeding, dilatation and evacuation for
Trisomy 18 fetus with 16 week intrauterine fetal demise 08/29/2014

EXAM:
TRANSABDOMINAL AND TRANSVAGINAL ULTRASOUND OF PELVIS
TECHNIQUE: Both transabdominal and transvaginal ultrasound examinations of the
pelvis were performed. Transabdominal technique was performed for
global imaging of the pelvis including uterus, ovaries, adnexal
regions, and pelvic cul-de-sac. It was necessary to proceed with
endovaginal exam following the transabdominal exam to visualize the
endometrium and ovaries.

[Series 1: us transvaginal non-ob · 13 of 48 slices shown]
[im 1/48]
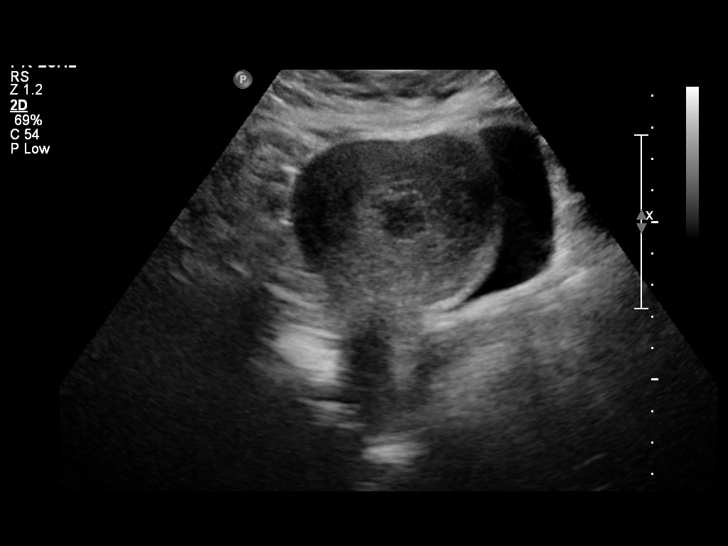
[im 4/48]
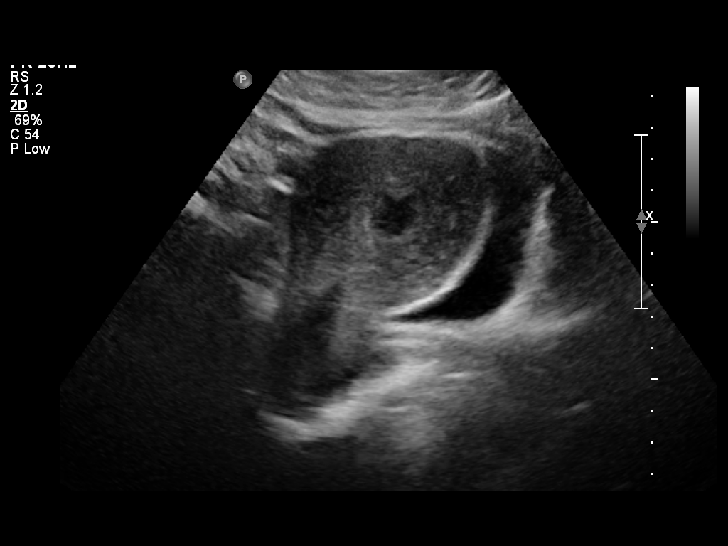
[im 8/48]
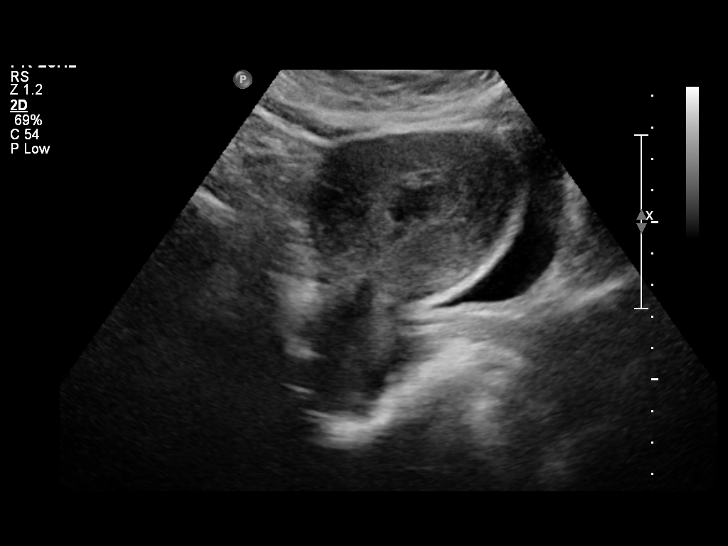
[im 12/48]
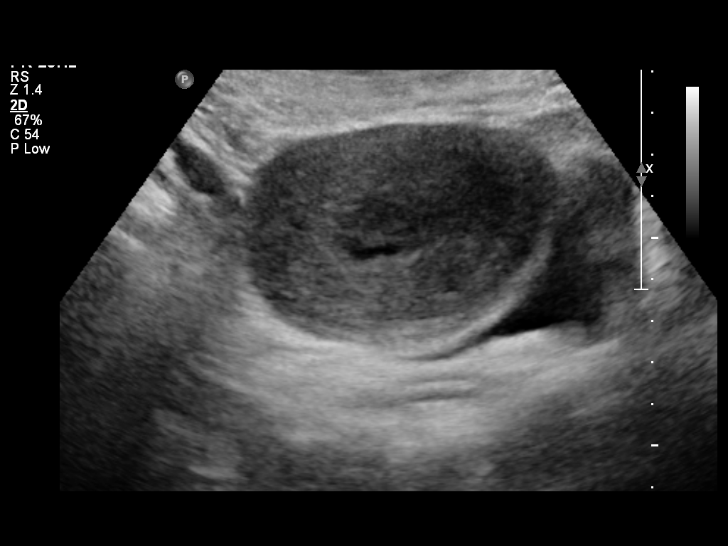
[im 16/48]
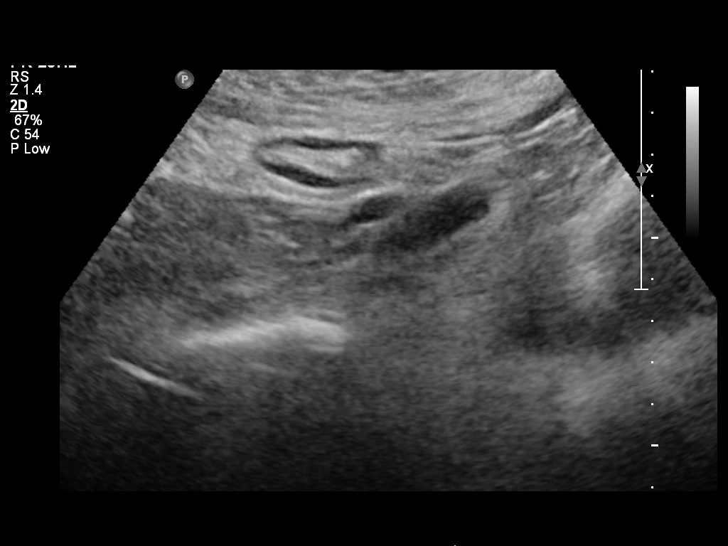
[im 20/48]
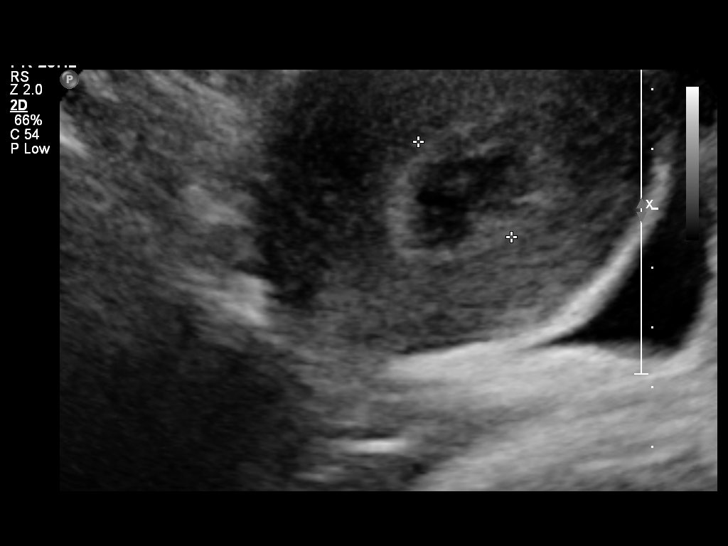
[im 24/48]
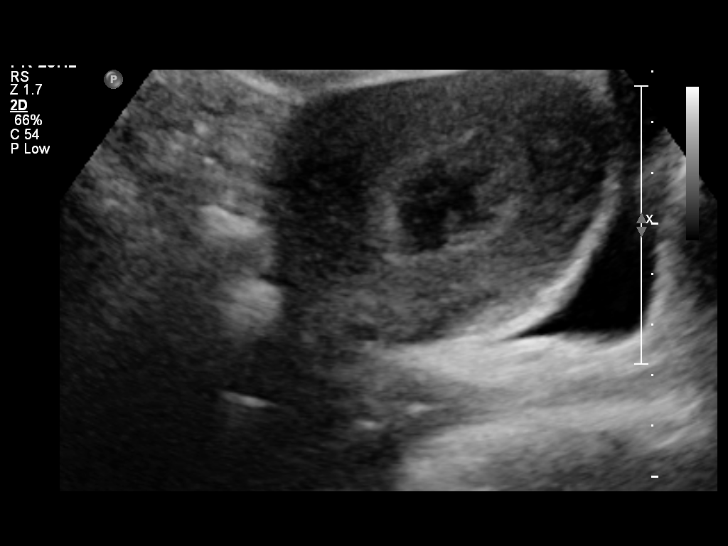
[im 28/48]
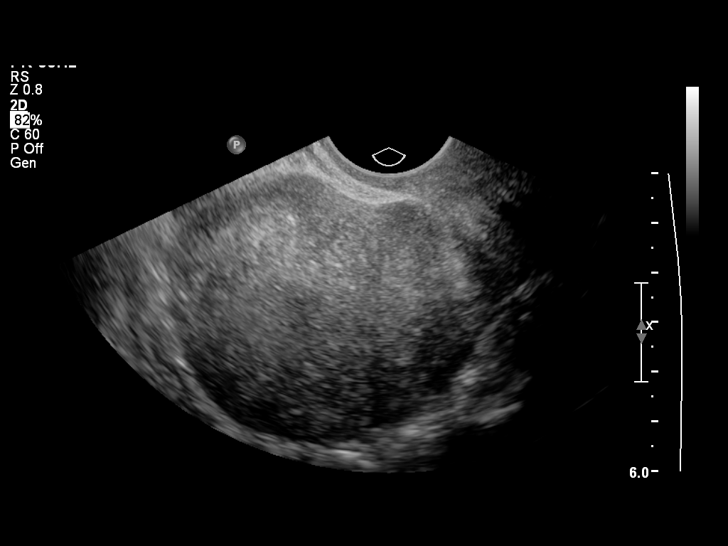
[im 32/48]
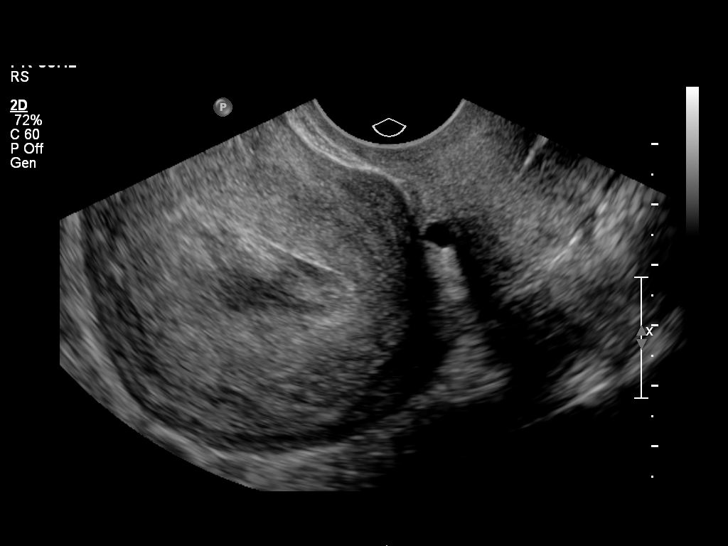
[im 36/48]
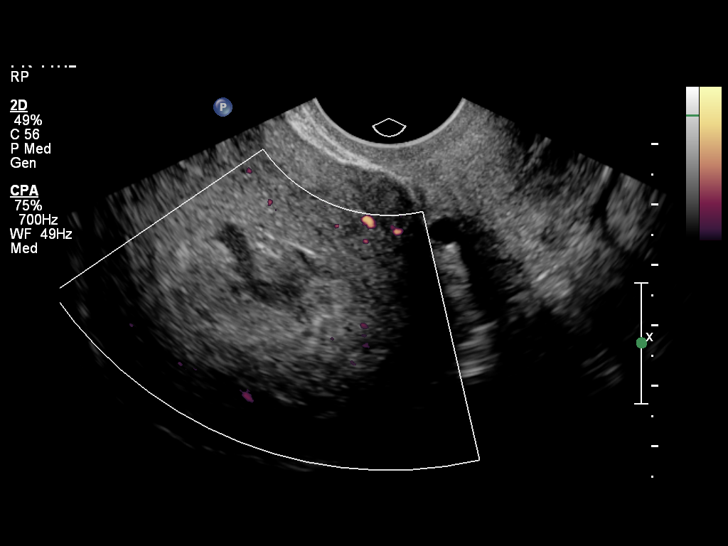
[im 40/48]
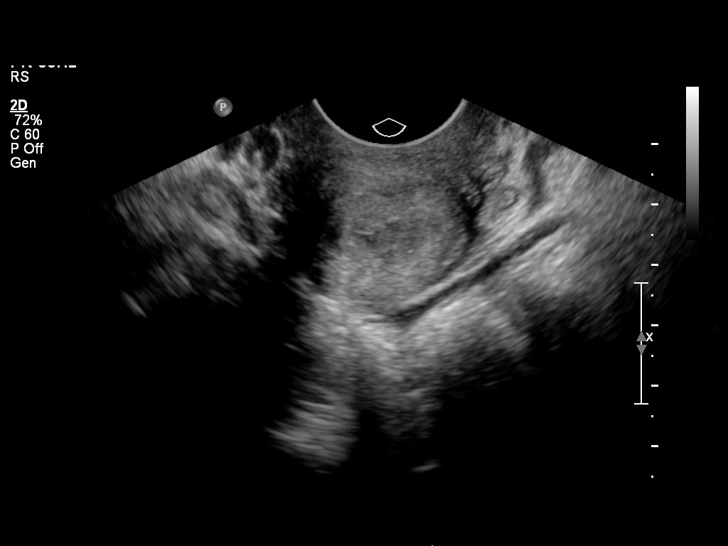
[im 44/48]
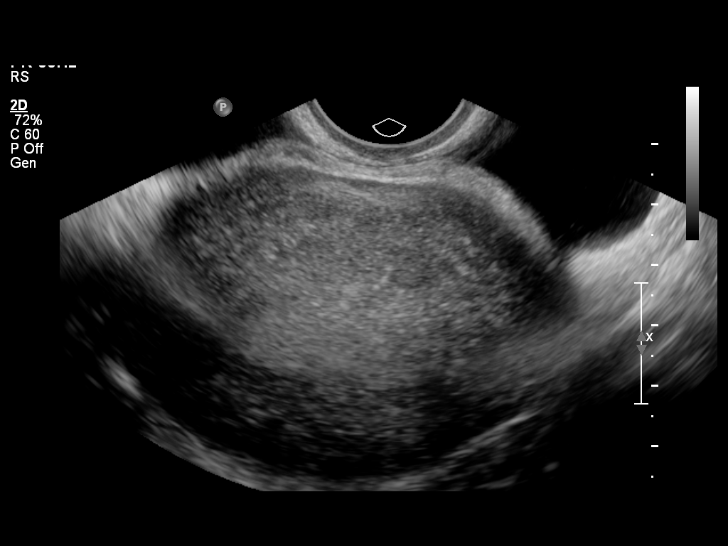
[im 48/48]
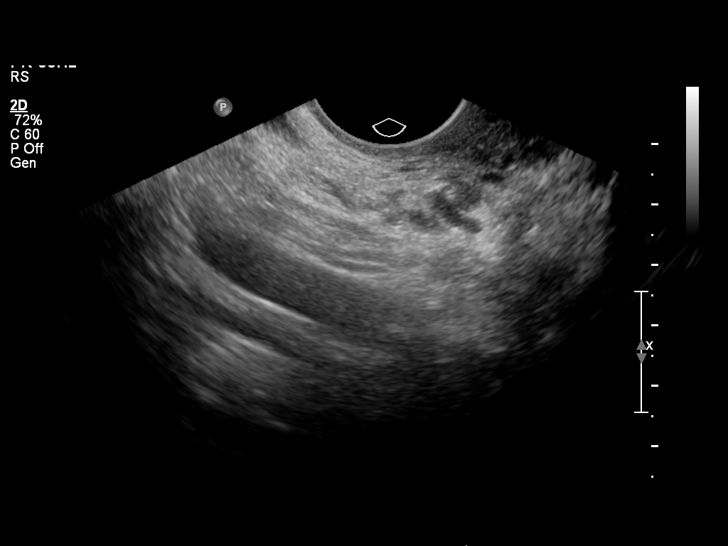

[13 of 25 positions shown; findings below may reference images not displayed]

FINDINGS: Uterus

Measurements: 10.0 x 7.4 x 5.6 cm, anteverted, anteflexed. No
fibroids or other mass visualized.

Endometrium

Thickness: 2.2 cm, irregularly thickened with a focus of internal
blood flow. No intrauterine gestational sac identified.

Right ovary

Measurements: Not visualized. No adnexal mass

Left ovary

Measurements: Not visualized. No adnexal mass

Other findings

Small amount of fluid in the cul-de-sac
IMPRESSION: Thickened and irregular endometrial stripe with a focus of internal
color flow, favored to represent retained products of conception
although debris/blood product could appear similar.

## 2016-07-08 ENCOUNTER — Telehealth: Payer: Self-pay | Admitting: *Deleted

## 2016-07-08 NOTE — Telephone Encounter (Signed)
WALK IN Pt presents at front desk asking to be seen this afternoon, she states she has an area on her L buttock for 1 1/2 to 2 wks that is red, swollen and painful, it has progressed to the center being firm and egg size, red warm and painful, she does not know if she has fevers, no other complaints Spoke w/ dr Eppie Gibson, will schedule for 0915 10/27 [facility]

## 2016-07-09 ENCOUNTER — Ambulatory Visit (INDEPENDENT_AMBULATORY_CARE_PROVIDER_SITE_OTHER): Payer: 59 | Admitting: Internal Medicine

## 2016-07-09 VITALS — BP 134/77 | HR 116 | Temp 98.2°F | Ht 60.0 in | Wt 155.0 lb

## 2016-07-09 DIAGNOSIS — Z9889 Other specified postprocedural states: Secondary | ICD-10-CM

## 2016-07-09 DIAGNOSIS — L03119 Cellulitis of unspecified part of limb: Principal | ICD-10-CM

## 2016-07-09 DIAGNOSIS — L0231 Cutaneous abscess of buttock: Secondary | ICD-10-CM

## 2016-07-09 DIAGNOSIS — B9689 Other specified bacterial agents as the cause of diseases classified elsewhere: Secondary | ICD-10-CM | POA: Diagnosis not present

## 2016-07-09 DIAGNOSIS — L02419 Cutaneous abscess of limb, unspecified: Secondary | ICD-10-CM

## 2016-07-09 MED ORDER — CLINDAMYCIN PALMITATE HCL 75 MG/5ML PO SOLR: 300.0000 mg | Freq: Four times a day (QID) | ORAL | 0 refills | Status: AC

## 2016-07-09 NOTE — Patient Instructions (Addendum)
You were seen today for a painful, irritated bump on your buttock.  This is due to a skin abscess, or small pocket of infection in the skin.  To treat this, we performed an incision and drainage procedure to cut into the abscess and allow the pus to drain.  You can still wash it and keep it clean.  It will probably continue to drain some pus and a small amount of blood, so you can keep it covered with a clean bandaid or bandage.  I have also prescribed you an antibiotic, clindamycin, to take 4 times per day for the next 7 days to make sure the infection goes away.  If you develop any fevers, chills, or bleeding that doesn't stop, please call the clinic for another appointment.  If the boil isn't improving in a week, please call for another appointment.     Abscess An abscess is an infected area that contains a collection of pus and debris.It can occur in almost any part of the body. An abscess is also known as a furuncle or boil. CAUSES  An abscess occurs when tissue gets infected. This can occur from blockage of oil or sweat glands, infection of hair follicles, or a minor injury to the skin. As the body tries to fight the infection, pus collects in the area and creates pressure under the skin. This pressure causes pain. People with weakened immune systems have difficulty fighting infections and get certain abscesses more often.  SYMPTOMS Usually an abscess develops on the skin and becomes a painful mass that is red, warm, and tender. If the abscess forms under the skin, you may feel a moveable soft area under the skin. Some abscesses break open (rupture) on their own, but most will continue to get worse without care. The infection can spread deeper into the body and eventually into the bloodstream, causing you to feel ill.  DIAGNOSIS  Your caregiver will take your medical history and perform a physical exam. A sample of fluid may also be taken from the abscess to determine what is causing your  infection. TREATMENT  Your caregiver may prescribe antibiotic medicines to fight the infection. However, taking antibiotics alone usually does not cure an abscess. Your caregiver may need to make a small cut (incision) in the abscess to drain the pus. In some cases, gauze is packed into the abscess to reduce pain and to continue draining the area. HOME CARE INSTRUCTIONS   Only take over-the-counter or prescription medicines for pain, discomfort, or fever as directed by your caregiver.  If you were prescribed antibiotics, take them as directed. Finish them even if you start to feel better.  If gauze is used, follow your caregiver's directions for changing the gauze.  To avoid spreading the infection:  Keep your draining abscess covered with a bandage.  Wash your hands well.  Do not share personal care items, towels, or whirlpools with others.  Avoid skin contact with others.  Keep your skin and clothes clean around the abscess.  Keep all follow-up appointments as directed by your caregiver. SEEK MEDICAL CARE IF:   You have increased pain, swelling, redness, fluid drainage, or bleeding.  You have muscle aches, chills, or a general ill feeling.  You have a fever. MAKE SURE YOU:   Understand these instructions.  Will watch your condition.  Will get help right away if you are not doing well or get worse.   This information is not intended to replace advice given to you  by your health care provider. Make sure you discuss any questions you have with your health care provider.   Document Released: 06/09/2005 Document Revised: 02/29/2012 Document Reviewed: 11/12/2011 Elsevier Interactive Patient Education [employer].

## 2016-07-09 NOTE — Progress Notes (Signed)
   CC: painful bump on buttock  HPI:  [name] is a 45 y.o. woman with history of GERD and depression who presents with 1 week of painful, red "boil" on her buttock.  Noticed small "sore" or buttock 1 week ago.  Grew in size, became more painful, surrounded by redness.  Size of golf ball, erythema covering much of buttock.  No known insect bites.  No fever, chills, other constitutional symptoms.  Trying hot compressess, warm bath, epsom salts, cut up potatoes.  Noticed some drainage today.  Has rarely had "boils" in her inguinal crease where her underwear rubs, but only a few.   Past Medical History:  Diagnosis Date  . Anxiety    history - no meds  . Arthritis    bulging disc L4/5  L5/S1  . Cystitis 07/2008   under care of [facility]  . Depression    history - no meds  . H/O hiatal hernia    no meds - diet controlled  . Hepatitis    History Hep A at age 89 yrs - no prev problems  . Hyperlipidemia   . Macular degeneration 2013   Age related per pt - no meds  . Pain, dental 2010   history - teeth ext per patient  . Plantar fasciitis    s/p left gastroc slide   . S/P dilatation of esophageal stricture 2006-2008    Review of Systems:   Review of Systems  Constitutional: Negative for chills, fever and malaise/fatigue.  Respiratory: Negative for shortness of breath.   Cardiovascular: Negative for chest pain.  Skin:       See HPI     Physical Exam:  Vitals:   07/09/16 0916  BP: 134/77  Pulse: (!) 116  Temp: 98.2 F (36.8 C)  TempSrc: Oral  SpO2: 100%  Weight: 155 lb (70.3 kg)  Height: 5' (1.524 m)   Physical Exam  Constitutional: She appears well-developed and well-nourished. No distress.  Cardiovascular: Normal rate and regular rhythm.   Pulmonary/Chest: Effort normal and breath sounds normal.  Skin:  L buttock with 3 cm diameter fluctuant area with small amount of purulent drainage and 0.5 cm patch of sloughing skin at apex.  Surrounded by  induration, tender, violaceous.  Larger area of adjacent erythema ~10-15 cm diameter.       Assessment & Plan:   See Encounters Tab for problem based charting.  Patient seen with Dr. Evette Doffing    Incision and Drainage Procedure Note  Indications:  Superficial abscess on left buttock  Procedure Details  Informed consent was obtained after explanation of the risks and benefits of the procedure, refer to the consent documentation.  Time-out was performed immediately prior to the procedure.  Local anesthesia with 1 percent lidocaine was introduced subcutaneously  to the skin overlying the abscess.  A small incision ~1 cm in length was made over the area of fluctuance with a sterile scalpel.  Manual pressure was applied to the area to express purulent drainage.  Bleeding was minimal and self limited, and hemostasis was rapid and spontaneous.  Condition:   The patient tolerated the procedure well and remains in the same condition as pre-procedure.  Complications: None; patient tolerated the procedure well.

## 2016-07-09 NOTE — Assessment & Plan Note (Signed)
Fluctuant, indurated, erythematous nodule with purulence consistent with cutaneous abscess.  No systemic signs or symptoms of infection.  Options of I&D plus antibiotics vs antibiotics alone discussed with. -I&D today -Clindamycin 300 mg QID 7 days (request liquid with Hx esophageal stricture)

## 2016-07-13 NOTE — Progress Notes (Signed)
Internal Medicine Clinic Attending  I saw and evaluated the patient.  I personally confirmed the key portions of the history and exam documented by Dr. Inda Castle and I reviewed pertinent patient test results.  The assessment, diagnosis, and plan were formulated together and I agree with the documentation in the resident's note. I was present for the entirety of the procedure.

## 2016-09-22 ENCOUNTER — Ambulatory Visit (INDEPENDENT_AMBULATORY_CARE_PROVIDER_SITE_OTHER): Payer: 59 | Admitting: Internal Medicine

## 2016-09-22 ENCOUNTER — Encounter: Payer: Self-pay | Admitting: Internal Medicine

## 2016-09-22 VITALS — BP 131/78 | HR 109 | Temp 98.0°F | Ht 60.0 in | Wt 160.3 lb

## 2016-09-22 DIAGNOSIS — G8929 Other chronic pain: Secondary | ICD-10-CM | POA: Diagnosis not present

## 2016-09-22 DIAGNOSIS — G4700 Insomnia, unspecified: Secondary | ICD-10-CM

## 2016-09-22 DIAGNOSIS — F5104 Psychophysiologic insomnia: Secondary | ICD-10-CM

## 2016-09-22 DIAGNOSIS — Z8719 Personal history of other diseases of the digestive system: Secondary | ICD-10-CM | POA: Diagnosis not present

## 2016-09-22 DIAGNOSIS — K222 Esophageal obstruction: Secondary | ICD-10-CM

## 2016-09-22 DIAGNOSIS — M545 Low back pain: Secondary | ICD-10-CM

## 2016-09-22 DIAGNOSIS — Z0000 Encounter for general adult medical examination without abnormal findings: Secondary | ICD-10-CM

## 2016-09-22 MED ORDER — MELATONIN 10 MG PO TABS: 10.0000 mg | ORAL_TABLET | Freq: Every day | ORAL | 0 refills | Status: DC

## 2016-09-22 NOTE — Patient Instructions (Signed)
It was a pleasure to meet you [name] [name].  We will try Melatonin 10 mg for your sleep. Take this around sunset to help with sleep at bedtime.  Please follow up with me in 6 months or sooner if needed.  Insomnia Insomnia is a sleep disorder that makes it difficult to fall asleep or to stay asleep. Insomnia can cause tiredness (fatigue), low energy, difficulty concentrating, mood swings, and poor performance at work or school. There are three different ways to classify insomnia:  Difficulty falling asleep.  Difficulty staying asleep.  Waking up too early in the morning. Any type of insomnia can be long-term (chronic) or short-term (acute). Both are common. Short-term insomnia usually lasts for three months or less. Chronic insomnia occurs at least three times a week for longer than three months. What are the causes? Insomnia may be caused by another condition, situation, or substance, such as:  Anxiety.  Certain medicines.  Gastroesophageal reflux disease (GERD) or other gastrointestinal conditions.  Asthma or other breathing conditions.  Restless legs syndrome, sleep apnea, or other sleep disorders.  Chronic pain.  Menopause. This may include hot flashes.  Stroke.  Abuse of alcohol, tobacco, or illegal drugs.  Depression.  Caffeine.  Neurological disorders, such as Alzheimer disease.  An overactive thyroid (hyperthyroidism). The cause of insomnia may not be known. What increases the risk? Risk factors for insomnia include:  Gender. Women are more commonly affected than men.  Age. Insomnia is more common as you get older.  Stress. This may involve your professional or personal life.  Income. Insomnia is more common in people with lower income.  Lack of exercise.  Irregular work schedule or night shifts.  Traveling between different time zones. What are the signs or symptoms? If you have insomnia, trouble falling asleep or trouble staying asleep is the main  symptom. This may lead to other symptoms, such as:  Feeling fatigued.  Feeling nervous about going to sleep.  Not feeling rested in the morning.  Having trouble concentrating.  Feeling irritable, anxious, or depressed. How is this treated? Treatment for insomnia depends on the cause. If your insomnia is caused by an underlying condition, treatment will focus on addressing the condition. Treatment may also include:  Medicines to help you sleep.  Counseling or therapy.  Lifestyle adjustments. Follow these instructions at home:  Take medicines only as directed by your health care provider.  Keep regular sleeping and waking hours. Avoid naps.  Keep a sleep diary to help you and your health care provider figure out what could be causing your insomnia. Include:  When you sleep.  When you wake up during the night.  How well you sleep.  How rested you feel the next day.  Any side effects of medicines you are taking.  What you eat and drink.  Make your bedroom a comfortable place where it is easy to fall asleep:  Put up shades or special blackout curtains to block light from outside.  Use a white noise machine to block noise.  Keep the temperature cool.  Exercise regularly as directed by your health care provider. Avoid exercising right before bedtime.  Use relaxation techniques to manage stress. Ask your health care provider to suggest some techniques that may work well for you. These may include:  Breathing exercises.  Routines to release muscle tension.  Visualizing peaceful scenes.  Cut back on alcohol, caffeinated beverages, and cigarettes, especially close to bedtime. These can disrupt your sleep.  Do not overeat or  eat spicy foods right before bedtime. This can lead to digestive discomfort that can make it hard for you to sleep.  Limit screen use before bedtime. This includes:  Watching TV.  Using your smartphone, tablet, and computer.  Stick to a  routine. This can help you fall asleep faster. Try to do a quiet activity, brush your teeth, and go to bed at the same time each night.  Get out of bed if you are still awake after 15 minutes of trying to sleep. Keep the lights down, but try reading or doing a quiet activity. When you feel sleepy, go back to bed.  Make sure that you drive carefully. Avoid driving if you feel very sleepy.  Keep all follow-up appointments as directed by your health care provider. This is important. Contact a health care provider if:  You are tired throughout the day or have trouble in your daily routine due to sleepiness.  You continue to have sleep problems or your sleep problems get worse. Get help right away if:  You have serious thoughts about hurting yourself or someone else. This information is not intended to replace advice given to you by your health care provider. Make sure you discuss any questions you have with your health care provider. Document Released: 08/27/2000 Document Revised: 01/30/2016 Document Reviewed: 05/31/2014 Elsevier Interactive Patient Education  2017 [employer].

## 2016-09-22 NOTE — Progress Notes (Signed)
   CC: Insomnia  HPI:  [name] is a 46 y.o. female with PMH of esophageal stricture s/p dilation, chronic back pain (L4-5, L5-S1 disc herniation), and depression/anxiety who presents with a complaint of difficulty sleeping.  Insomnia: Patient reports chronic sleep difficulties. She has trouble initiating sleep and wakes in the middle of the night several times. She reports trying to avoid stimulating activities (TV, cell phone, computer) prior to bedtime. She has tried melatonin years ago and unsure if it helped. She says Ambien has helped before. She is currently taking Amitriptyline 50 mg qhs for her low back pain prescribed by her orthopedic physician which does help with her pain as well as sleep.  Esophageal stricture s/p dilatation: Has seen Dr. Amedeo Plenty with [facility] in the past. This is currently stable. She is able to swallow solids and liquids mostly without issue. No dysphagia.  Healthcare Maintenance: Patient reports receiving the flu shot in Oct or Nov 2017.   Past Medical History:  Diagnosis Date  . Anxiety    history - no meds  . Arthritis    bulging disc L4/5  L5/S1  . Cystitis 07/2008   under care of [facility]  . Depression    history - no meds  . H/O hiatal hernia    no meds - diet controlled  . Hepatitis    History Hep A at age 59 yrs - no prev problems  . Hyperlipidemia   . Macular degeneration 2013   Age related per pt - no meds  . Pain, dental 2010   history - teeth ext per patient  . Plantar fasciitis    s/p left gastroc slide   . S/P dilatation of esophageal stricture 2006-2008    Review of Systems:   Review of Systems  Constitutional: Negative for chills and fever.  Respiratory: Negative for shortness of breath.   Cardiovascular: Negative for chest pain.  Musculoskeletal: Positive for back pain.  Psychiatric/Behavioral: Negative for substance abuse. The patient has insomnia.      Physical Exam:  Vitals:   09/22/16 1402  BP:  131/78  Pulse: (!) 109  Temp: 98 F (36.7 C)  TempSrc: Oral  SpO2: 98%  Weight: 160 lb 4.8 oz (72.7 kg)  Height: 5' (1.524 m)   Physical Exam  Constitutional: She is oriented to person, place, and time. She appears well-developed and well-nourished. No distress.  Cardiovascular: Regular rhythm.   Slight tachycardia  Pulmonary/Chest: Effort normal. No respiratory distress. She has no wheezes. She has no rales.  Musculoskeletal:       Cervical back: She exhibits no tenderness.       Thoracic back: She exhibits no tenderness.       Lumbar back: She exhibits no tenderness.  Negative straight leg test  Neurological: She is alert and oriented to person, place, and time.  Skin: Skin is warm.    Assessment & Plan:   See Encounters Tab for problem based charting.  Patient discussed with Dr. Dareen Piano

## 2016-09-24 DIAGNOSIS — Z01411 Encounter for gynecological examination (general) (routine) with abnormal findings: Secondary | ICD-10-CM | POA: Insufficient documentation

## 2016-09-25 NOTE — Assessment & Plan Note (Signed)
Patient reports chronic sleep difficulties. She has trouble initiating sleep and wakes in the middle of the night several times. She reports trying to avoid stimulating activities (TV, cell phone, computer) prior to bedtime. She has tried melatonin years ago and unsure if it helped. She says Ambien has helped before. She is currently taking Amitriptyline 50 mg qhs for her low back pain prescribed by her orthopedic physician which does help with her pain as well as sleep.  Discussed sleep hygiene techniques with patient. She does find some relief with Amitriptyline. Will try Melatonin 10 mg nightly for additional help with sleep.

## 2016-09-25 NOTE — Assessment & Plan Note (Signed)
Has seen Dr. Amedeo Plenty with [name] in the past. This is currently stable. She is able to swallow solids and liquids mostly without issue. No dysphagia.  Currently stable.

## 2016-09-25 NOTE — Assessment & Plan Note (Signed)
Patient reports receiving the flu shot in Oct or Nov 2017.

## 2016-09-28 NOTE — Progress Notes (Signed)
Internal Medicine Clinic Attending  Case discussed with Dr. Patel,Vishal at the time of the visit.  We reviewed the resident's history and exam and pertinent patient test results.  I agree with the assessment, diagnosis, and plan of care documented in the resident's note.  

## 2017-09-29 ENCOUNTER — Other Ambulatory Visit: Payer: Self-pay | Admitting: Gastroenterology

## 2017-09-29 ENCOUNTER — Encounter (HOSPITAL_COMMUNITY): Payer: Self-pay | Admitting: *Deleted

## 2017-09-29 DIAGNOSIS — K222 Esophageal obstruction: Secondary | ICD-10-CM | POA: Diagnosis not present

## 2017-09-29 DIAGNOSIS — R1314 Dysphagia, pharyngoesophageal phase: Secondary | ICD-10-CM | POA: Diagnosis not present

## 2017-09-29 NOTE — Progress Notes (Signed)
Unable to reach pt by phone for pre-op call. Her number just rings and then quits. I spoke with her mother to ask if pt had another number and she stated that pt did not. I did not give pt's mother instructions since she wasn't listed as a DPR.

## 2017-09-30 ENCOUNTER — Ambulatory Visit (HOSPITAL_COMMUNITY)
Admission: RE | Admit: 2017-09-30 | Discharge: 2017-09-30 | Disposition: A | Discharge status: Home / Self Care | Payer: 59 | Source: Ambulatory Visit | Attending: Gastroenterology | Admitting: Gastroenterology

## 2017-09-30 ENCOUNTER — Other Ambulatory Visit: Payer: Self-pay

## 2017-09-30 ENCOUNTER — Encounter (HOSPITAL_COMMUNITY)
Admission: RE | Disposition: A | Discharge status: Home / Self Care | Payer: Self-pay | Source: Ambulatory Visit | Attending: Gastroenterology

## 2017-09-30 ENCOUNTER — Encounter (HOSPITAL_COMMUNITY): Payer: Self-pay

## 2017-09-30 ENCOUNTER — Ambulatory Visit (HOSPITAL_COMMUNITY): Payer: 59 | Admitting: Anesthesiology

## 2017-09-30 DIAGNOSIS — K2210 Ulcer of esophagus without bleeding: Secondary | ICD-10-CM | POA: Diagnosis not present

## 2017-09-30 DIAGNOSIS — F17200 Nicotine dependence, unspecified, uncomplicated: Secondary | ICD-10-CM | POA: Insufficient documentation

## 2017-09-30 DIAGNOSIS — K222 Esophageal obstruction: Secondary | ICD-10-CM | POA: Diagnosis not present

## 2017-09-30 DIAGNOSIS — K219 Gastro-esophageal reflux disease without esophagitis: Secondary | ICD-10-CM | POA: Diagnosis not present

## 2017-09-30 DIAGNOSIS — R1314 Dysphagia, pharyngoesophageal phase: Secondary | ICD-10-CM | POA: Insufficient documentation

## 2017-09-30 DIAGNOSIS — F419 Anxiety disorder, unspecified: Secondary | ICD-10-CM | POA: Insufficient documentation

## 2017-09-30 DIAGNOSIS — Z79899 Other long term (current) drug therapy: Secondary | ICD-10-CM | POA: Insufficient documentation

## 2017-09-30 DIAGNOSIS — K228 Other specified diseases of esophagus: Secondary | ICD-10-CM | POA: Diagnosis not present

## 2017-09-30 DIAGNOSIS — R1310 Dysphagia, unspecified: Secondary | ICD-10-CM | POA: Diagnosis not present

## 2017-09-30 HISTORY — PX: ESOPHAGOGASTRODUODENOSCOPY: SHX5428

## 2017-09-30 LAB — PREGNANCY, URINE: PREG TEST UR: NEGATIVE

## 2017-09-30 SURGERY — EGD (ESOPHAGOGASTRODUODENOSCOPY)
Anesthesia: Monitor Anesthesia Care

## 2017-09-30 SURGERY — ESOPHAGOGASTRODUODENOSCOPY (EGD) WITH PROPOFOL
Anesthesia: Monitor Anesthesia Care

## 2017-09-30 MED ORDER — LIDOCAINE HCL (CARDIAC) 20 MG/ML IV SOLN
INTRAVENOUS | Status: DC | PRN
  Administered 2017-09-30: 50 mg via INTRATRACHEAL

## 2017-09-30 MED ORDER — SODIUM CHLORIDE 0.9 % IV SOLN: INTRAVENOUS | Status: DC

## 2017-09-30 MED ORDER — PANTOPRAZOLE SODIUM 40 MG PO TBEC: 40.0000 mg | DELAYED_RELEASE_TABLET | Freq: Two times a day (BID) | ORAL | 1 refills | Status: DC

## 2017-09-30 MED ORDER — PROPOFOL 10 MG/ML IV BOLUS
INTRAVENOUS | Status: DC | PRN
  Administered 2017-09-30 (×2): 50 mg via INTRAVENOUS

## 2017-09-30 MED ORDER — PROPOFOL 500 MG/50ML IV EMUL
INTRAVENOUS | Status: DC | PRN
  Administered 2017-09-30: 100 ug/kg/min via INTRAVENOUS

## 2017-09-30 MED ORDER — SODIUM CHLORIDE 0.9 % IV SOLN
INTRAVENOUS | Status: DC | PRN
  Administered 2017-09-30: 11:00:00 via INTRAVENOUS

## 2017-09-30 NOTE — H&P (Signed)
The patient is a 47 year old female who was seen in our office yesterday with complaints of food sticking. This started a few days ago and initially she states she was unable to swallow water or any food but then it improved somewhat to where she was able to swallow liquids. She has been having progressive problems with dysphagia. She has a history of a Schatzki's ring dilated in 2014 and 2016.  Physical no distress  Heart regular rhythm no murmurs  Lungs clear  Abdomen soft and nontender  Impression dysphagia secondary to Schatzki's ring.  Plan: EGD with dilatation, consider biopsies for eosinophilic esophagitis.

## 2017-09-30 NOTE — Anesthesia Preprocedure Evaluation (Signed)
Anesthesia Evaluation  Patient identified by MRN, date of birth, ID band Patient awake    Reviewed: Allergy & Precautions, NPO status , Patient's Chart, lab work & pertinent test results  Airway Mallampati: II  TM Distance: >3 FB Neck ROM: Full    Dental no notable dental hx.    Pulmonary neg pulmonary ROS, Current Smoker,    Pulmonary exam normal breath sounds clear to auscultation       Cardiovascular negative cardio ROS Normal cardiovascular exam Rhythm:Regular Rate:Normal     Neuro/Psych Anxiety negative neurological ROS  negative psych ROS   GI/Hepatic negative GI ROS, Neg liver ROS, GERD  ,  Endo/Other  negative endocrine ROS  Renal/GU negative Renal ROS  negative genitourinary   Musculoskeletal negative musculoskeletal ROS (+)   Abdominal   Peds  Hematology negative hematology ROS (+)   Anesthesia Other Findings   Reproductive/Obstetrics negative OB ROS                             Anesthesia Physical Anesthesia Plan  ASA: III  Anesthesia Plan: MAC   Post-op Pain Management:    Induction:   PONV Risk Score and Plan: Treatment may vary due to age or medical condition  Airway Management Planned: Mask, Natural Airway and Nasal Cannula  Additional Equipment:   Intra-op Plan:   Post-operative Plan:   Informed Consent: I have reviewed the patients History and Physical, chart, labs and discussed the procedure including the risks, benefits and alternatives for the proposed anesthesia with the patient or authorized representative who has indicated his/her understanding and acceptance.     Plan Discussed with: CRNA and Anesthesiologist  Anesthesia Plan Comments:         Anesthesia Quick Evaluation

## 2017-09-30 NOTE — Anesthesia Postprocedure Evaluation (Signed)
Anesthesia Post Note  Patient: [name] [name]  Procedure(s) Performed: ESOPHAGOGASTRODUODENOSCOPY (EGD) (N/A )     Patient location during evaluation: PACU Anesthesia Type: MAC Level of consciousness: awake and alert Pain management: pain level controlled Vital Signs Assessment: post-procedure vital signs reviewed and stable Respiratory status: spontaneous breathing, nonlabored ventilation, respiratory function stable and patient connected to nasal cannula oxygen Cardiovascular status: stable and blood pressure returned to baseline Postop Assessment: no apparent nausea or vomiting Anesthetic complications: no    Last Vitals:  Vitals:   09/30/17 0951 09/30/17 1110  BP: (!) 141/72 (!) 109/57  Pulse: (!) 110 90  Resp: 13 19  Temp: 37 C 36.5 C  SpO2: 98% 99%    Last Pain:  Vitals:   09/30/17 1110  TempSrc: Oral                 Barnet Glasgow

## 2017-09-30 NOTE — Anesthesia Procedure Notes (Signed)
Procedure Name: MAC Date/Time: 09/30/2017 10:48 AM Performed by: Teressa Lower., CRNA Pre-anesthesia Checklist: Patient identified, Emergency Drugs available, Suction available, Patient being monitored and Timeout performed Oxygen Delivery Method: Nasal cannula

## 2017-09-30 NOTE — Op Note (Signed)
[facility] Patient Name: [name] [name] Procedure Date : 09/30/2017 MRN: [id number] Attending MD: Wonda Horner , MD Date of Birth: 08/03/1971 CSN: [id number] Age: 47 Admit Type: Outpatient Procedure:                Upper GI endoscopy Indications:              Dysphagia Providers:                Wonda Horner, MD, Cleda Daub, RN, Tinnie Gens,                            Technician Referring MD:              Medicines:                Propofol per Anesthesia Complications:            No immediate complications. Estimated Blood Loss:     Estimated blood loss was minimal. Procedure:                Pre-Anesthesia Assessment:                           - Prior to the procedure, a History and Physical                            was performed, and patient medications and                            allergies were reviewed. The patient's tolerance of                            previous anesthesia was also reviewed. The risks                            and benefits of the procedure and the sedation                            options and risks were discussed with the patient.                            All questions were answered, and informed consent                            was obtained. Prior Anticoagulants: The patient has                            taken no previous anticoagulant or antiplatelet                            agents. ASA Grade Assessment: II - A patient with                            mild systemic disease. After reviewing the risks  and benefits, the patient was deemed in                            satisfactory condition to undergo the procedure.                           After obtaining informed consent, the endoscope was                            passed under direct vision. Throughout the                            procedure, the patient's blood pressure, pulse, and                            oxygen saturations were monitored  continuously. The                            EG-[id number] ([id number]) scope was introduced through the                            mouth, and advanced to the second part of duodenum.                            The upper GI endoscopy was accomplished without                            difficulty. The patient tolerated the procedure                            well. Scope In: Scope Out: Findings:      One benign-appearing, intrinsic stenosis was found. It was located in       the distal esophagus a couple centimeters above the EG junction area. It       may be a Schatski's ring but not a classic appearance. This may be a web       with half the circumference of the esophagus involved. To the side of       the web there was a small ulcer. The scope would not pass this area. A       TTS dilator was passed through the scope. Dilation with a 12-13.5-15 mm       balloon dilator was performed to 15 mm. Biopsies were taken with a cold       forceps for histology of the esophagus. It did not look like       eosinophilic esophagitis endoscopically. One the dilation was completed       the scope was advanced.      The entire examined stomach was normal.      The examined duodenum was normal. Impression:               - Benign-appearing esophageal stenosis. Dilated.                            Biopsied.                           -  Normal stomach.                           - Normal examined duodenum. Moderate Sedation:      . Recommendation:           - Clear liquid diet for 2 days. Then advance to                            soft for 2 days. Then as tolerated.                           - Use Protonix (pantoprazole) 40 mg PO BID.                           - Observe patient's clinical course.                           - Return to my office in 2 weeks.                           - Continue present medications. Procedure Code(s):        --- Professional ---                           [phone],  Esophagogastroduodenoscopy, flexible,                            transoral; with transendoscopic balloon dilation of                            esophagus (less than 30 mm diameter)                           [id number], Esophagogastroduodenoscopy, flexible,                            transoral; with biopsy, single or multiple Diagnosis Code(s):        --- Professional ---                           K22.2, Esophageal obstruction                           R13.10, Dysphagia, unspecified CPT copyright 2016 American Medical Association. All rights reserved. The codes documented in this report are preliminary and upon coder review may  be revised to meet current compliance requirements. Wonda Horner, MD 09/30/2017 11:28:41 AM This report has been signed electronically. Number of Addenda: 0

## 2017-09-30 NOTE — Discharge Instructions (Signed)
YOU HAD AN ENDOSCOPIC PROCEDURE TODAY: Refer to the procedure report and other information in the discharge instructions given to you for any specific questions about what was found during the examination. If this information does not answer your questions, please call [facility] office at [phone] to clarify.   YOU SHOULD EXPECT: Some feelings of bloating in the abdomen. Passage of more gas than usual. Walking can help get rid of the air that was put into your GI tract during the procedure and reduce the bloating. If you had a lower endoscopy (such as a colonoscopy or flexible sigmoidoscopy) you may notice spotting of blood in your stool or on the toilet paper. Some abdominal soreness may be present for a day or two, also.  DIET: Your first meal following the procedure should be a light meal and then it is ok to progress to your normal diet. A half-sandwich or bowl of soup is an example of a good first meal. Heavy or fried foods are harder to digest and may make you feel nauseous or bloated. Drink plenty of fluids but you should avoid alcoholic beverages for 24 hours. If you had a esophageal dilation, please see attached instructions for diet.   ACTIVITY: Your care partner should take you home directly after the procedure. You should plan to take it easy, moving slowly for the rest of the day. You can resume normal activity the day after the procedure however YOU SHOULD NOT DRIVE, use power tools, machinery or perform tasks that involve climbing or major physical exertion for 24 hours (because of the sedation medicines used during the test).   SYMPTOMS TO REPORT IMMEDIATELY: A gastroenterologist can be reached at any hour. Please call [phone]  for any of the following symptoms:  Following lower endoscopy (colonoscopy, flexible sigmoidoscopy) Excessive amounts of blood in the stool  Significant tenderness, worsening of abdominal pains  Swelling of the abdomen that is new, acute  Fever of 100 or  higher  Following upper endoscopy (EGD, EUS, ERCP, esophageal dilation) Vomiting of blood or coffee ground material  New, significant abdominal pain  New, significant chest pain or pain under the shoulder blades  Painful or persistently difficult swallowing  New shortness of breath  Black, tarry-looking or red, bloody stools  FOLLOW UP:  If any biopsies were taken you will be contacted by phone or by letter within the next 1-3 weeks. Call [phone]  if you have not heard about the biopsies in 3 weeks.  Please also call with any specific questions about appointments or follow up tests. YOU HAD AN ENDOSCOPIC PROCEDURE TODAY: Refer to the procedure report and other information in the discharge instructions given to you for any specific questions about what was found during the examination. If this information does not answer your questions, please call [facility] office at [phone] to clarify.   YOU SHOULD EXPECT: Some feelings of bloating in the abdomen. Passage of more gas than usual. Walking can help get rid of the air that was put into your GI tract during the procedure and reduce the bloating. If you had a lower endoscopy (such as a colonoscopy or flexible sigmoidoscopy) you may notice spotting of blood in your stool or on the toilet paper. Some abdominal soreness may be present for a day or two, also.  DIET: Your first meal following the procedure should be a light meal and then it is ok to progress to your normal diet. A half-sandwich or bowl of soup is an example  of a good first meal. Heavy or fried foods are harder to digest and may make you feel nauseous or bloated. Drink plenty of fluids but you should avoid alcoholic beverages for 24 hours. If you had a esophageal dilation, please see attached instructions for diet.   ACTIVITY: Your care partner should take you home directly after the procedure. You should plan to take it easy, moving slowly for the rest of the day. You can resume  normal activity the day after the procedure however YOU SHOULD NOT DRIVE, use power tools, machinery or perform tasks that involve climbing or major physical exertion for 24 hours (because of the sedation medicines used during the test).   SYMPTOMS TO REPORT IMMEDIATELY: A gastroenterologist can be reached at any hour. Please call [phone]  for any of the following symptoms:   Following upper endoscopy (EGD, EUS, ERCP, esophageal dilation) Vomiting of blood or coffee ground material  New, significant abdominal pain  New, significant chest pain or pain under the shoulder blades  Painful or persistently difficult swallowing  New shortness of breath  Black, tarry-looking or red, bloody stools  FOLLOW UP:  If any biopsies were taken you will be contacted by phone or by letter within the next 1-3 weeks. Call [phone]  if you have not heard about the biopsies in 3 weeks.  Please also call with any specific questions about appointments or follow up tests.

## 2017-09-30 NOTE — Transfer of Care (Signed)
Immediate Anesthesia Transfer of Care Note  Patient: [name] [name]  Procedure(s) Performed: ESOPHAGOGASTRODUODENOSCOPY (EGD) (N/A )  Patient Location: Endoscopy Unit  Anesthesia Type:MAC  Level of Consciousness: awake, alert  and oriented  Airway & Oxygen Therapy: Patient Spontanous Breathing and Patient connected to nasal cannula oxygen  Post-op Assessment: Report given to RN and Post -op Vital signs reviewed and stable  Post vital signs: Reviewed and stable  Last Vitals:  Vitals:   09/30/17 0951  BP: (!) 141/72  Pulse: (!) 110  Resp: 13  Temp: 37 C  SpO2: 98%    Last Pain:  Vitals:   09/30/17 0951  TempSrc: Oral         Complications: No apparent anesthesia complications

## 2017-10-03 ENCOUNTER — Encounter (HOSPITAL_COMMUNITY): Payer: Self-pay | Admitting: Gastroenterology

## 2017-10-16 DIAGNOSIS — J019 Acute sinusitis, unspecified: Secondary | ICD-10-CM | POA: Diagnosis not present

## 2017-10-26 DIAGNOSIS — K222 Esophageal obstruction: Secondary | ICD-10-CM | POA: Diagnosis not present

## 2017-12-21 ENCOUNTER — Ambulatory Visit (INDEPENDENT_AMBULATORY_CARE_PROVIDER_SITE_OTHER): Payer: 59 | Admitting: Nurse Practitioner

## 2017-12-21 ENCOUNTER — Encounter: Payer: Self-pay | Admitting: Nurse Practitioner

## 2017-12-21 VITALS — BP 133/83 | HR 72 | Ht 60.0 in | Wt 170.4 lb

## 2017-12-21 DIAGNOSIS — Z1151 Encounter for screening for human papillomavirus (HPV): Secondary | ICD-10-CM | POA: Diagnosis not present

## 2017-12-21 DIAGNOSIS — Z01411 Encounter for gynecological examination (general) (routine) with abnormal findings: Secondary | ICD-10-CM | POA: Diagnosis not present

## 2017-12-21 DIAGNOSIS — N939 Abnormal uterine and vaginal bleeding, unspecified: Secondary | ICD-10-CM | POA: Insufficient documentation

## 2017-12-21 DIAGNOSIS — Z113 Encounter for screening for infections with a predominantly sexual mode of transmission: Secondary | ICD-10-CM

## 2017-12-21 DIAGNOSIS — F17200 Nicotine dependence, unspecified, uncomplicated: Secondary | ICD-10-CM

## 2017-12-21 DIAGNOSIS — Z124 Encounter for screening for malignant neoplasm of cervix: Secondary | ICD-10-CM | POA: Diagnosis not present

## 2017-12-21 DIAGNOSIS — R87810 Cervical high risk human papillomavirus (HPV) DNA test positive: Secondary | ICD-10-CM | POA: Diagnosis not present

## 2017-12-21 DIAGNOSIS — G4700 Insomnia, unspecified: Secondary | ICD-10-CM

## 2017-12-21 LAB — CBC
HEMOGLOBIN: 14.1 g/dL (ref 11.1–15.9)
Hematocrit: 41.9 % (ref 34.0–46.6)
MCH: 31.4 pg (ref 26.6–33.0)
MCHC: 33.7 g/dL (ref 31.5–35.7)
MCV: 93 fL (ref 79–97)
PLATELETS: 343 10*3/uL (ref 150–379)
RBC: 4.49 x10E6/uL (ref 3.77–5.28)
RDW: 13.8 % (ref 12.3–15.4)
WBC: 10 10*3/uL (ref 3.4–10.8)

## 2017-12-21 MED ORDER — MEDROXYPROGESTERONE ACETATE 5 MG PO TABS: 5.0000 mg | ORAL_TABLET | Freq: Every day | ORAL | 0 refills | Status: DC

## 2017-12-21 NOTE — Patient Instructions (Addendum)
Drink at least 8 8-oz glasses of water every day. Walk outside for 15 minutes at least 5 days a week. 5 slow deep breaths daily and more if needed.  Develop a sense of here and now - presence in this moment. Body scan at night - relax body, relax mind with a nice place to be when you cannot sleep. Work on stopping smoking - call 1-800-QUIT-NOW. Get your medication at the pharmacy and take for 5 days. Call the office if your bleeding continues to be abnormal after the next bleeding episode. Call the chaplain at [facility] to find out about the Walk to Remember and other options for talking about pregnancy loss.    Dysfunctional Uterine Bleeding Dysfunctional uterine bleeding is abnormal bleeding from the uterus. Dysfunctional uterine bleeding includes:  A period that comes earlier or later than usual.  A period that is lighter, heavier, or has blood clots.  Bleeding between periods.  Skipping one or more periods.  Bleeding after sexual intercourse.  Bleeding after menopause.  Follow these instructions at home: Pay attention to any changes in your symptoms. Follow these instructions to help with your condition: Eating and drinking  Eat well-balanced meals. Include foods that are high in iron, such as liver, meat, shellfish, green leafy vegetables, and eggs.  If you become constipated: ? Drink plenty of water. ? Eat fruits and vegetables that are high in water and fiber, such as spinach, carrots, raspberries, apples, and mango. Medicines  Take over-the-counter and prescription medicines only as told by your health care provider.  Do not change medicines without talking with your health care provider.  Aspirin or medicines that contain aspirin may make the bleeding worse. Do not take those medicines: ? During the week before your period. ? During your period.  If you were prescribed iron pills, take them as told by your health care provider. Iron pills help to replace  iron that your body loses because of this condition. Activity  If you need to change your sanitary pad or tampon more than one time every 2 hours: ? Lie in bed with your feet raised (elevated). ? Place a cold pack on your lower abdomen. ? Rest as much as possible until the bleeding stops or slows down.  Do not try to lose weight until the bleeding has stopped and your blood iron level is back to normal. Other Instructions  For two months, write down: ? When your period starts. ? When your period ends. ? When any abnormal bleeding occurs. ? What problems you notice.  Keep all follow up visits as told by your health care provider. This is important. Contact a health care provider if:  You get light-headed or weak.  You have nausea and vomiting.  You cannot eat or drink without vomiting.  You feel dizzy or have diarrhea while you are taking medicines.  You are taking birth control pills or hormones, and you want to change them or stop taking them. Get help right away if:  You develop a fever or chills.  You need to change your sanitary pad or tampon more than one time per hour.  Your bleeding becomes heavier, or your flow contains clots more often.  You develop pain in your abdomen.  You lose consciousness.  You develop a rash. This information is not intended to replace advice given to you by your health care provider. Make sure you discuss any questions you have with your health care provider. Document Released: 08/27/2000 Document  Revised: 02/05/2016 Document Reviewed: 11/25/2014 Elsevier Interactive Patient Education  [employer].

## 2017-12-21 NOTE — Progress Notes (Signed)
[facility]   GYNECOLOGY ANNUAL PREVENTATIVE CARE ENCOUNTER NOTE  Subjective:   [name] [name] is a 47 y.o. [phone] female here for a routine annual gynecologic exam.  Current complaints: vaginal bleeding for about one month daily - varies in amount - some days are heavy and other days it is spotting.  Is also feeling tired and does not want to go to social events. Having difficulty sleeping  - both in going to sleep and staying asleep.  Denies abnormal discharge, pelvic pain, problems with intercourse or other gynecologic concerns. Is not having uterine cramping.  Client continues to discuss her previous pregnancy loss in 2015 which was due to IUFD at 21 weeks due to Trisomy 20.  States her family and friends are not supportive of her - time to be over it.   Gynecologic History Patient's last menstrual period was 12/21/2017. Contraception: abstinence Last Pap: 2015. Results were: abnormal Last mammogram: Has never had one.   Obstetric History OB History  Gravida Para Term Preterm AB Living  4 2 2   1 2   SAB TAB Ectopic Multiple Live Births  1       2    # Outcome Date GA Lbr Len/2nd Weight Sex Delivery Anes PTL Lv  4 Term 12/27/97 [redacted]w[redacted]d  7 lb 1 oz (3.204 kg) M VBAC EPI  LIV  3 Term 06/10/90 [redacted]w[redacted]d  5 lb 10 oz (2.551 kg) F CS-Unspec Spinal  LIV     Birth Comments: had pre-elcampsia  2 Gravida           1 SAB             Past Medical History:  Diagnosis Date  . Anxiety    history - no meds  . Arthritis    bulging disc L4/5  L5/S1  . Cystitis 07/2008   under care of [facility]  . Depression    history - no meds  . H/O hiatal hernia    no meds - diet controlled  . Hepatitis    History Hep A at age 59 yrs - no prev problems  . Hyperlipidemia   . Macular degeneration 2013   Age related per pt - no meds  . Pain, dental 2010   history - teeth ext per patient  . Plantar fasciitis    s/p left gastroc slide   . S/P dilatation of esophageal stricture 2006-2008    Past Surgical  History:  Procedure Laterality Date  . CESAREAN SECTION     1991  . CHOLECYSTECTOMY  1991  . COLONOSCOPY    . dilatation of esophageal stricture  2005-2008  . DILATION AND EVACUATION  02/09/2012   Procedure: DILATATION AND EVACUATION;  Surgeon: Osborne Oman, MD;  Location: [facility];  Service: Gynecology;  Laterality: N/A;  With ultrasound guidance  . DILATION AND EVACUATION N/A 08/29/2014   Procedure: DILATATION AND EVACUATION (D&E) 2ND TRIMESTER;  Surgeon: Mora Bellman, MD;  Location: [facility];  Service: Gynecology;  Laterality: N/A;  . ESOPHAGOGASTRODUODENOSCOPY N/A 09/30/2017   Procedure: ESOPHAGOGASTRODUODENOSCOPY (EGD);  Surgeon: Wonda Horner, MD;  Location: [facility]COPY;  Service: Endoscopy;  Laterality: N/A;  . gastroc slide  2005   left side   . Pancystourethroscopy    . plantar fasciitis     left foot  . SVD  1999   x 1    Current Outpatient Medications on File Prior to Visit  Medication Sig Dispense Refill  . amitriptyline (ELAVIL) 50 MG tablet Take  50 mg by mouth at bedtime.    . pantoprazole (PROTONIX) 40 MG tablet Take 1 tablet (40 mg total) by mouth 2 (two) times daily. 30 tablet 1   No current facility-administered medications on file prior to visit.     Allergies  Allergen Reactions  . Azithromycin Other (See Comments)    REACTION: stomach pain  . Penicillins Nausea Only and Rash    Has patient had a PCN reaction causing immediate rash, facial/tongue/throat swelling, SOB or lightheadedness with hypotension: Yes Has patient had a PCN reaction causing severe rash involving mucus membranes or skin necrosis: No Has patient had a PCN reaction that required hospitalization: No Has patient had a PCN reaction occurring within the last 10 years: No If all of the above answers are "NO", then may proceed with Cephalosporin use.     Social History   Socioeconomic History  . Marital status: Single    Spouse name: Not on file  . Number of children: Not on file  .  Years of education: Not on file  . Highest education level: Not on file  Occupational History  . Not on file  Social Needs  . Financial resource strain: Not on file  . Food insecurity:    Worry: Not on file    Inability: Not on file  . Transportation needs:    Medical: Not on file    Non-medical: Not on file  Tobacco Use  . Smoking status: Current Every Day Smoker    Packs/day: 0.25    Years: 8.00    Pack years: 2.00    Types: Cigarettes  . Smokeless tobacco: Never Used  . Tobacco comment: 4-5 cigs/day  Substance and Sexual Activity  . Alcohol use: No    Comment: rarely  . Drug use: No  . Sexual activity: Yes    Birth control/protection: None  Lifestyle  . Physical activity:    Days per week: Not on file    Minutes per session: Not on file  . Stress: Not on file  Relationships  . Social connections:    Talks on phone: Not on file    Gets together: Not on file    Attends religious service: Not on file    Active member of club or organization: Not on file    Attends meetings of clubs or organizations: Not on file    Relationship status: Not on file  . Intimate partner violence:    Fear of current or ex partner: Not on file    Emotionally abused: Not on file    Physically abused: Not on file    Forced sexual activity: Not on file  Other Topics Concern  . Not on file  Social History Narrative   Current smoker 1/2 PPD   Denies alcohol use   2 children     Family History  Problem Relation Age of Onset  . Diabetes Mother   . Hyperlipidemia Mother   . Stroke Mother   . Hypertension Mother     The following portions of the patient's history were reviewed and updated as appropriate: allergies, current medications, past family history, past medical history, past social history, past surgical history and problem list.  Review of Systems Pertinent items noted in HPI and remainder of comprehensive ROS otherwise negative.   Objective:  BP 133/83   Pulse 72   Ht 5'  (1.524 m)   Wt 170 lb 6.4 oz (77.3 kg)   LMP 12/21/2017   BMI 33.28 kg/m  CONSTITUTIONAL: Well-developed, well-nourished female in no acute distress.  HENT:  Normocephalic, atraumatic, External right and left ear normal. EYES: Conjunctivae and EOM are normal.  No scleral icterus.  NECK: Normal range of motion, supple, no masses.  Normal thyroid.  SKIN: Skin is warm and dry. No rash noted. Not diaphoretic. No erythema. No pallor. NEUROLOGIC: Alert and oriented to person, place, and time. Normal muscle tone coordination. No cranial nerve deficit noted. PSYCHIATRIC: Normal mood and affect. Normal behavior. Normal judgment and thought content. CARDIOVASCULAR: Normal heart rate noted, regular rhythm RESPIRATORY: Clear to auscultation bilaterally. Effort and breath sounds normal, no problems with respiration noted. BREASTS: Symmetric in size. No masses, skin changes, nipple drainage, or lymphadenopathy. ABDOMEN: Soft, no distention noted.  No tenderness, rebound or guarding.  PELVIC: Normal appearing external genitalia; normal appearing vaginal mucosa and cervix.  No abnormal discharge noted.  Pap smear obtained.  Normal uterine size, no other palpable masses, no uterine or adnexal tenderness. MUSCULOSKELETAL: Normal range of motion. No tenderness.  No cyanosis, clubbing, or edema.    Assessment and Plan:  1. Abnormal vaginal bleeding Will prescribe provera 5 mg PO daily for 5 days to see if the daily bleeding will resolve - informed client she will have vaginal bleeding after she stops the provera and expect that to be a heavy but normal last ing menses.  Expect that bleeding to stop like a period.  Call the office if your bleeding continues to be abnormal.  - CBC  2. Encntr for gyn exam (general) (routine) w abnormal findings  Will follow up results of pap smear and manage accordingly. Mammogram scheduled Routine preventative health maintenance measures emphasized. Please refer to After  Visit Summary for other counseling recommendations.  Drink at least 8 8-oz glasses of water every day. Walk outside for 15 minutes at least 5 days a week. 5 slow deep breaths daily and more if needed.  Develop a sense of here and now - presence in this moment. Body scan at night - relax body, relax mind with a nice place to be when you cannot sleep. Work on stopping smoking - call 1-800-QUIT-NOW. Get your medication at the pharmacy and take for 5 days. Call the office if your bleeding continues to be abnormal after the next bleeding episode. Call the chaplain at [facility] to find out about the Walk to Remember and other options for talking about pregnancy loss. Weight loss advised with healthy eating and increasing exercise.    Earlie Server, RN, MSN, NP-BC Nurse Practitioner, [place] for [facility]

## 2017-12-21 NOTE — Progress Notes (Signed)
LAST PAP 2015 NEED MAMMOGRAM SCHEDULE

## 2017-12-27 LAB — CYTOLOGY - PAP
CHLAMYDIA, DNA PROBE: NEGATIVE
Diagnosis: NEGATIVE
HPV 16/18/45 GENOTYPING: POSITIVE — AB
HPV: DETECTED — AB
NEISSERIA GONORRHEA: NEGATIVE

## 2017-12-28 ENCOUNTER — Encounter: Payer: Self-pay | Admitting: Nurse Practitioner

## 2017-12-28 DIAGNOSIS — R87810 Cervical high risk human papillomavirus (HPV) DNA test positive: Secondary | ICD-10-CM | POA: Insufficient documentation

## 2018-01-16 ENCOUNTER — Ambulatory Visit (INDEPENDENT_AMBULATORY_CARE_PROVIDER_SITE_OTHER): Payer: 59 | Admitting: Obstetrics & Gynecology

## 2018-01-16 ENCOUNTER — Encounter: Payer: Self-pay | Admitting: Obstetrics & Gynecology

## 2018-01-16 VITALS — BP 130/80 | HR 72 | Wt 167.0 lb

## 2018-01-16 DIAGNOSIS — N939 Abnormal uterine and vaginal bleeding, unspecified: Secondary | ICD-10-CM

## 2018-01-16 DIAGNOSIS — D252 Subserosal leiomyoma of uterus: Secondary | ICD-10-CM | POA: Diagnosis not present

## 2018-01-16 DIAGNOSIS — D251 Intramural leiomyoma of uterus: Secondary | ICD-10-CM | POA: Diagnosis not present

## 2018-01-16 DIAGNOSIS — D250 Submucous leiomyoma of uterus: Secondary | ICD-10-CM

## 2018-01-16 DIAGNOSIS — Z1239 Encounter for other screening for malignant neoplasm of breast: Secondary | ICD-10-CM

## 2018-01-16 DIAGNOSIS — Z3202 Encounter for pregnancy test, result negative: Secondary | ICD-10-CM

## 2018-01-16 LAB — POCT URINE PREGNANCY: PREG TEST UR: NEGATIVE

## 2018-01-16 NOTE — Patient Instructions (Signed)
Uterine Fibroids Uterine fibroids are tissue masses (tumors) that can develop in the womb (uterus). They are also called leiomyomas. This type of tumor is not cancerous (benign) and does not spread to other parts of the body outside of the pelvic area, which is between the hip bones. Occasionally, fibroids may develop in the fallopian tubes, in the cervix, or on the support structures (ligaments) that surround the uterus. You can have one or many fibroids. Fibroids can vary in size, weight, and where they grow in the uterus. Some can become quite large. Most fibroids do not require medical treatment. What are the causes? A fibroid can develop when a single uterine cell keeps growing (replicating). Most cells in the human body have a control mechanism that keeps them from replicating without control. What are the signs or symptoms? Symptoms may include:  Heavy bleeding during your period.  Bleeding or spotting between periods.  Pelvic pain and pressure.  Bladder problems, such as needing to urinate more often (urinary frequency) or urgently.  Inability to reproduce offspring (infertility).  Miscarriages.  How is this diagnosed? Uterine fibroids are diagnosed through a physical exam. Your health care provider may feel the lumpy tumors during a pelvic exam. Ultrasonography and an MRI may be done to determine the size, location, and number of fibroids. How is this treated? Treatment may include:  Watchful waiting. This involves getting the fibroid checked by your health care provider to see if it grows or shrinks. Follow your health care provider's recommendations for how often to have this checked.  Hormone medicines. These can be taken by mouth or given through an intrauterine device (IUD).  Surgery. ? Removing the fibroids (myomectomy) or the uterus (hysterectomy). ? Removing blood supply to the fibroids (uterine artery embolization).  If fibroids interfere with your fertility and you  want to become pregnant, your health care provider may recommend having the fibroids removed. Follow these instructions at home:  Keep all follow-up visits as directed by your health care provider. This is important.  Take over-the-counter and prescription medicines only as told by your health care provider. ? If you were prescribed a hormone treatment, take the hormone medicines exactly as directed.  Ask your health care provider about taking iron pills and increasing the amount of dark green, leafy vegetables in your diet. These actions can help to boost your blood iron levels, which may be affected by heavy menstrual bleeding.  Pay close attention to your period and tell your health care provider about any changes, such as: ? Increased blood flow that requires you to use more pads or tampons than usual per month. ? A change in the number of days that your period lasts per month. ? A change in symptoms that are associated with your period, such as abdominal cramping or back pain. Contact a health care provider if:  You have pelvic pain, back pain, or abdominal cramps that cannot be controlled with medicines.  You have an increase in bleeding between and during periods.  You soak tampons or pads in a half hour or less.  You feel lightheaded, extra tired, or weak. Get help right away if:  You faint.  You have a sudden increase in pelvic pain. This information is not intended to replace advice given to you by your health care provider. Make sure you discuss any questions you have with your health care provider. Document Released: 08/27/2000 Document Revised: 04/29/2016 Document Reviewed: 02/26/2014 Elsevier Interactive Patient Education  2018 Elsevier Inc.  

## 2018-01-16 NOTE — Progress Notes (Signed)
History:  47 y.o. [id number] here today for AUB. Pt reports bleeding that began in Mar. The bleeding was light but, persistent. Pt reports that she had 1 year of irreg menses. Where she might have 2 periods per month.  She was prescribed Provera for 5 days.  The bleeding improved then came right back.     The following portions of the patient's history were reviewed and updated as appropriate: allergies, current medications, past family history, past medical history, past social history, past surgical history and problem list.  Review of Systems:  Pertinent items are noted in HPI.    Objective:  Physical Exam Blood pressure 130/80, pulse 72, weight 167 lb (75.8 kg), last menstrual period 01/16/2018.   CONSTITUTIONAL: Well-developed, well-nourished female in no acute distress.  HENT:  Normocephalic, atraumatic EYES: Conjunctivae and EOM are normal. No scleral icterus.  NECK: Normal range of motion SKIN: Skin is warm and dry. No rash noted. Not diaphoretic.No pallor. [place]: Alert and oriented to person, place, and time. Normal coordination.  GYN:   Uterus 14 week sized. No adnexal masses or tenderness   The indications for endometrial biopsy were reviewed.   Risks of the biopsy including cramping, bleeding, infection, uterine perforation, inadequate specimen and need for additional procedures  were discussed. The patient states she understands and agrees to undergo procedure today. Consent was signed. Time out was performed. Urine HCG was negative. A sterile speculum was placed in the patient's vagina and the cervix was prepped with Betadine. A single-toothed tenaculum was placed on the anterior lip of the cervix to stabilize it. The 3 mm pipelle was introduced into the endometrial cavity without difficulty to a depth of 7cm, and a moderate amount of tissue was obtained and sent to pathology. The instruments were removed from the patient's vagina. Minimal bleeding from the cervix was noted. The  patient tolerated the procedure well.   Assessment & Plan:  AUB/ enlarged uterus. Suspect uterine fibroids.:  Megace 40mg  per day x 3 months  Pelvic US  Surg path  F/u in 3 months or sooner prn  Repeat PAP in 1 year.   Kyng Matlock L. Harraway-Smith, M.D., Cherlynn June

## 2018-01-17 NOTE — Addendum Note (Signed)
Addended by: Phillip Heal, Fitz Matsuo A on: 01/17/2018 12:02 PM   Modules accepted: Orders

## 2018-01-18 ENCOUNTER — Other Ambulatory Visit: Payer: Self-pay

## 2018-01-18 MED ORDER — MEGESTROL ACETATE 40 MG PO TABS: 40.0000 mg | ORAL_TABLET | Freq: Every day | ORAL | 3 refills | Status: DC

## 2018-01-18 NOTE — Telephone Encounter (Signed)
Patient was supposed to have megace called into her pharmacy. Medication have been called into to patient pharmacy.

## 2018-01-20 ENCOUNTER — Ambulatory Visit (HOSPITAL_COMMUNITY)
Admission: RE | Admit: 2018-01-20 | Discharge: 2018-01-20 | Disposition: A | Discharge status: Home / Self Care | Payer: 59 | Source: Ambulatory Visit | Attending: Obstetrics & Gynecology | Admitting: Obstetrics & Gynecology

## 2018-01-20 DIAGNOSIS — N939 Abnormal uterine and vaginal bleeding, unspecified: Secondary | ICD-10-CM | POA: Diagnosis not present

## 2018-01-20 DIAGNOSIS — D252 Subserosal leiomyoma of uterus: Secondary | ICD-10-CM | POA: Diagnosis not present

## 2018-02-03 ENCOUNTER — Ambulatory Visit
Admission: RE | Admit: 2018-02-03 | Discharge: 2018-02-03 | Disposition: A | Discharge status: Home / Self Care | Payer: 59 | Source: Ambulatory Visit | Attending: Obstetrics & Gynecology | Admitting: Obstetrics & Gynecology

## 2018-02-03 DIAGNOSIS — Z1231 Encounter for screening mammogram for malignant neoplasm of breast: Secondary | ICD-10-CM | POA: Diagnosis not present

## 2018-02-03 DIAGNOSIS — Z1239 Encounter for other screening for malignant neoplasm of breast: Secondary | ICD-10-CM

## 2018-02-08 DIAGNOSIS — G894 Chronic pain syndrome: Secondary | ICD-10-CM | POA: Diagnosis not present

## 2018-02-08 DIAGNOSIS — M5416 Radiculopathy, lumbar region: Secondary | ICD-10-CM | POA: Diagnosis not present

## 2018-03-15 ENCOUNTER — Encounter: Payer: Self-pay | Admitting: Internal Medicine

## 2018-03-15 ENCOUNTER — Ambulatory Visit: Payer: 59 | Admitting: Internal Medicine

## 2018-03-15 ENCOUNTER — Other Ambulatory Visit: Payer: Self-pay

## 2018-03-15 VITALS — BP 150/80 | HR 109 | Temp 99.0°F | Ht 60.0 in | Wt 174.2 lb

## 2018-03-15 DIAGNOSIS — F419 Anxiety disorder, unspecified: Secondary | ICD-10-CM

## 2018-03-15 DIAGNOSIS — F5104 Psychophysiologic insomnia: Secondary | ICD-10-CM | POA: Diagnosis not present

## 2018-03-15 DIAGNOSIS — R350 Frequency of micturition: Secondary | ICD-10-CM

## 2018-03-15 DIAGNOSIS — M7989 Other specified soft tissue disorders: Secondary | ICD-10-CM | POA: Insufficient documentation

## 2018-03-15 DIAGNOSIS — D219 Benign neoplasm of connective and other soft tissue, unspecified: Secondary | ICD-10-CM | POA: Diagnosis not present

## 2018-03-15 DIAGNOSIS — G4700 Insomnia, unspecified: Secondary | ICD-10-CM

## 2018-03-15 DIAGNOSIS — Z0000 Encounter for general adult medical examination without abnormal findings: Secondary | ICD-10-CM

## 2018-03-15 DIAGNOSIS — F329 Major depressive disorder, single episode, unspecified: Secondary | ICD-10-CM

## 2018-03-15 DIAGNOSIS — F17210 Nicotine dependence, cigarettes, uncomplicated: Secondary | ICD-10-CM

## 2018-03-15 LAB — POCT GLYCOSYLATED HEMOGLOBIN (HGB A1C): Hemoglobin A1C: 5.2 % (ref 4.0–5.6)

## 2018-03-15 LAB — GLUCOSE, CAPILLARY: GLUCOSE-CAPILLARY: 110 mg/dL — AB (ref 70–99)

## 2018-03-15 NOTE — Progress Notes (Signed)
CC: Leg Swelling, Insomnia  HPI: [name] is a 47 y.o. F w/ PMH of GERD, Depression and Anxiety and Fibroids presenting to the clinic with chief complain of legs welling and insomnia. She was in her usual state of health until 3 weeks ago when she started to notice that her legs are swelling up at night. 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 Denies any pain, shortness of breath, chest pain, palpitations, orthopnea, or foamy urine.  Patient also complains of chronic insomnia. She states she has tried melatonin in the past but it was ineffective. She states she drinks Dr.Pepper at night and is a half-a pack smoker primarily in the evening. She almost mentions having to urinate frequently at night and feeling very thirsty all the time.  Past Medical History:  Diagnosis Date  . Anxiety    history - no meds  . Arthritis    bulging disc L4/5  L5/S1  . Cystitis 07/2008   under care of [facility]  . Depression    history - no meds  . H/O hiatal hernia    no meds - diet controlled  . Hepatitis    History Hep A at age 60 yrs - no prev problems  . Hyperlipidemia   . Macular degeneration 2013   Age related per pt - no meds  . Pain, dental 2010   history - teeth ext per patient  . Plantar fasciitis    s/p left gastroc slide   . S/P dilatation of esophageal stricture 2006-2008    Review of Systems: Review of Systems  Constitutional: Positive for malaise/fatigue. Negative for chills, fever and weight loss.  HENT: Negative for congestion and sinus pain.        Denies stertor  Eyes: Negative for blurred vision, double vision and photophobia.  Respiratory: Negative for cough, sputum production, shortness of breath, wheezing and stridor.   Cardiovascular: Positive for leg swelling. Negative for chest pain, palpitations and orthopnea.  Gastrointestinal: Negative for abdominal pain, constipation, diarrhea, nausea and  vomiting.  Genitourinary: Positive for frequency. Negative for dysuria and urgency.  Skin: Negative for itching and rash.       Denies any ulcers in lower extremities  Psychiatric/Behavioral: Positive for depression. Negative for suicidal ideas. The patient is nervous/anxious and has insomnia.      Physical Exam: Vitals:   03/15/18 0925  BP: (!) 150/80  Pulse: (!) 109  Temp: 99 F (37.2 C)  TempSrc: Oral  SpO2: 98%  Weight: 174 lb 3.2 oz (79 kg)  Height: 5' (1.524 m)    Physical Exam  Constitutional: She is oriented to person, place, and time. She appears well-developed and well-nourished. No distress.  HENT:  Head: Atraumatic.  Mouth/Throat: Oropharynx is clear and moist.  Eyes: Conjunctivae and EOM are normal. No scleral icterus.  Neck: Normal range of motion. Neck supple. No JVD present. No thyromegaly present.  Cardiovascular: Normal rate, regular rhythm, normal heart sounds and intact distal pulses.  Barely pitting edema on left ankle with immediate rebound. No varicose veins visualized bilaterally  Respiratory: Effort normal and breath sounds normal. No stridor. No respiratory distress. She has no wheezes.  GI: Soft. Bowel sounds are normal. There is no tenderness. There is no guarding.  Neurological: She is alert and oriented to person, place, and time. She has normal reflexes.  Skin: Skin is warm and dry. No rash noted. No erythema.  Assessment & Plan:   See Encounters Tab for problem based charting.  Patient seen with Dr. Beryle Beams   -Gilberto Better, PGY1

## 2018-03-15 NOTE — Assessment & Plan Note (Signed)
-   Reports of lower extremity swelling bilaterally near ankles at night beginning 3 weeks ago - Denies any shortness of breath, chest pain, palpitations, orthopnea, foamy urine - No evidence of JVD, Lung crackles, irregular heart rate or rhythm on physical exam - No PMH of cardiac or renal disease - Will check BMP to assess kidney function - Recommend night time elevation and day time compression stocking use

## 2018-03-15 NOTE — Assessment & Plan Note (Signed)
-   States difficulty sleeping at night is a chronic issue - Attempted melatonin in the past but ineffective - Drinks Dr.Pepper at night and smokes cigarette primarily in the evening - Also mentions waking frequently to urinate at night - Will check hgba1c to r/o T2DM - Counseled pt on sleep hygiene (avoiding late night caffeine/nicotine, staying away from electronic devices before bedtime) - Recommend OTC benadryl use for sleep

## 2018-03-15 NOTE — Patient Instructions (Addendum)
Dear [name]  You came to the clinic for your leg swelling and insomnia. We took a look at your leg swelling and ordered some lab tests to ensure that it is not due to seriours heart or kidney conditions. You also mentioned frequent urination and thirst interrupting your sleep so we ordered a blood test to check for diabetes.  We recommend you use compression stockings during the day to help reduce the swelling at night, and to try using benadryl to help you sleep. Please return to [place] in 6 weeks to let us know how those methods have been working.  Thank you so much.  -Kristie Cowman

## 2018-03-15 NOTE — Progress Notes (Signed)
Medicine attending: I personally interviewed and briefly examined this patient on the day of the patient visit and reviewed pertinent clinical ,laboratory data  with resident physician Dr. Gilberto Better and we discussed a management plan. Pt  Who is normotensive, no cardiac hx, concerned w leg swelling. Cardiovasc exam normal. No edema on exam. Rx conservative measures. 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 No results w mlatonin. Will try benadryl. Short interim F/U to assess need for antidepressant.

## 2018-03-16 LAB — BMP8+ANION GAP
ANION GAP: 19 mmol/L — AB (ref 10.0–18.0)
BUN/Creatinine Ratio: 12 (ref 9–23)
BUN: 9 mg/dL (ref 6–24)
CO2: 23 mmol/L (ref 20–29)
CREATININE: 0.74 mg/dL (ref 0.57–1.00)
Calcium: 9.6 mg/dL (ref 8.7–10.2)
Chloride: 99 mmol/L (ref 96–106)
GFR calc Af Amer: 112 mL/min/{1.73_m2} (ref >59)
GFR, EST NON AFRICAN AMERICAN: 97 mL/min/{1.73_m2} (ref >59)
Glucose: 105 mg/dL — ABNORMAL HIGH (ref 65–99)
Potassium: 2.9 mmol/L — ABNORMAL LOW (ref 3.5–5.2)
Sodium: 141 mmol/L (ref 134–144)

## 2018-03-17 ENCOUNTER — Telehealth: Payer: Self-pay | Admitting: Internal Medicine

## 2018-03-17 NOTE — Telephone Encounter (Signed)
Left voicemail at patient's home phone regarding her low potassium level. Recommend increasing potassium intake in diet as well as OTC potassium supplements.

## 2018-03-31 DIAGNOSIS — J019 Acute sinusitis, unspecified: Secondary | ICD-10-CM | POA: Diagnosis not present

## 2018-03-31 DIAGNOSIS — I10 Essential (primary) hypertension: Secondary | ICD-10-CM | POA: Diagnosis not present

## 2018-04-03 ENCOUNTER — Encounter: Payer: Self-pay | Admitting: *Deleted

## 2018-04-05 ENCOUNTER — Telehealth: Payer: Self-pay | Admitting: Hematology

## 2018-04-05 ENCOUNTER — Encounter: Payer: Self-pay | Admitting: Hematology

## 2018-04-05 NOTE — Telephone Encounter (Signed)
New hematology referral has been received from [facility] for polycythemia vera. Pt has been scheduled to see Dr. Irene Limbo on 8/14 at 10am. Pt aware to arrive 30 minutes early. Letter mailed.

## 2018-04-26 ENCOUNTER — Encounter: Payer: 59 | Admitting: Hematology

## 2018-05-19 DIAGNOSIS — J019 Acute sinusitis, unspecified: Secondary | ICD-10-CM | POA: Diagnosis not present

## 2018-05-19 DIAGNOSIS — I10 Essential (primary) hypertension: Secondary | ICD-10-CM | POA: Diagnosis not present

## 2018-06-02 ENCOUNTER — Encounter: Payer: Self-pay | Admitting: Hematology

## 2018-06-02 ENCOUNTER — Telehealth: Payer: Self-pay | Admitting: Hematology

## 2018-06-02 NOTE — Telephone Encounter (Signed)
Pt cld to reschedule appt to see Dr. Irene Limbo on 10/17 at 10am. Pt aware to arrive 30 minutes early. New letter mailed.

## 2018-06-29 ENCOUNTER — Telehealth: Payer: Self-pay | Admitting: Hematology

## 2018-06-29 ENCOUNTER — Inpatient Hospital Stay: Payer: 59

## 2018-06-29 ENCOUNTER — Inpatient Hospital Stay: Payer: 59 | Attending: Hematology | Admitting: Hematology

## 2018-06-29 VITALS — BP 119/80 | HR 122 | Temp 98.6°F | Resp 18 | Ht 60.0 in | Wt 168.4 lb

## 2018-06-29 DIAGNOSIS — D473 Essential (hemorrhagic) thrombocythemia: Secondary | ICD-10-CM

## 2018-06-29 DIAGNOSIS — D751 Secondary polycythemia: Secondary | ICD-10-CM | POA: Insufficient documentation

## 2018-06-29 DIAGNOSIS — D75839 Thrombocytosis, unspecified: Secondary | ICD-10-CM

## 2018-06-29 DIAGNOSIS — D72829 Elevated white blood cell count, unspecified: Secondary | ICD-10-CM

## 2018-06-29 LAB — CMP (CANCER CENTER ONLY)
ALT: 60 U/L — AB (ref 0–44)
ANION GAP: 13 (ref 5–15)
AST: 32 U/L (ref 15–41)
Albumin: 4.1 g/dL (ref 3.5–5.0)
Alkaline Phosphatase: 114 U/L (ref 38–126)
BUN: 13 mg/dL (ref 6–20)
CHLORIDE: 97 mmol/L — AB (ref 98–111)
CO2: 31 mmol/L (ref 22–32)
CREATININE: 0.83 mg/dL (ref 0.44–1.00)
Calcium: 10.4 mg/dL — ABNORMAL HIGH (ref 8.9–10.3)
Glucose, Bld: 94 mg/dL (ref 70–99)
POTASSIUM: 3.4 mmol/L — AB (ref 3.5–5.1)
Sodium: 141 mmol/L (ref 135–145)
Total Bilirubin: 0.4 mg/dL (ref 0.3–1.2)
Total Protein: 7.9 g/dL (ref 6.5–8.1)

## 2018-06-29 LAB — CBC WITH DIFFERENTIAL/PLATELET
Abs Immature Granulocytes: 0.03 10*3/uL (ref 0.00–0.07)
Basophils Absolute: 0 10*3/uL (ref 0.0–0.1)
Basophils Relative: 0 %
EOS PCT: 1 %
Eosinophils Absolute: 0.1 10*3/uL (ref 0.0–0.5)
HCT: 47.7 % — ABNORMAL HIGH (ref 36.0–46.0)
HEMOGLOBIN: 16.4 g/dL — AB (ref 12.0–15.0)
IMMATURE GRANULOCYTES: 0 %
LYMPHS PCT: 20 %
Lymphs Abs: 1.9 10*3/uL (ref 0.7–4.0)
MCH: 32 pg (ref 26.0–34.0)
MCHC: 34.4 g/dL (ref 30.0–36.0)
MCV: 93.2 fL (ref 80.0–100.0)
Monocytes Absolute: 0.6 10*3/uL (ref 0.1–1.0)
Monocytes Relative: 6 %
NRBC: 0 % (ref 0.0–0.2)
Neutro Abs: 6.9 10*3/uL (ref 1.7–7.7)
Neutrophils Relative %: 73 %
Platelets: 306 10*3/uL (ref 150–400)
RBC: 5.12 MIL/uL — ABNORMAL HIGH (ref 3.87–5.11)
RDW: 14.8 % (ref 11.5–15.5)
WBC: 9.6 10*3/uL (ref 4.0–10.5)

## 2018-06-29 LAB — FERRITIN: FERRITIN: 29 ng/mL (ref 11–307)

## 2018-06-29 NOTE — Progress Notes (Signed)
HEMATOLOGY/ONCOLOGY CONSULTATION NOTE  Date of Service: 06/29/2018  Patient Care Team: Carroll Sage, MD as PCP - General  Dr. Shanon Rosser as PCP   CHIEF COMPLAINTS/PURPOSE OF CONSULTATION:  Concern for Polycythemia vera   HISTORY OF PRESENTING ILLNESS:   [name] [name] is a wonderful 47 y.o. female who has been referred to [place] by Dr. Nita Sickle for evaluation and management of her concern for Polycythemia vera. The pt reports that she is doing well overall.   The pt reports that at the time of her last labs in July she had a sinus infection, which was the occasion for work up. She endorses frequent sins infections. She notes that she has not known of higher red blood cells prior to this, and her April blood work did not reflect high red blood cell counts.    The pt notes that she has felt very tired for a while, and that this has become slightly worse in the last 6 months and endorses a gradual onset, denying any acute life changes or medication changes. She notes that she always stays very hydrated. She describes feeling tired much of the time, but wakes up frequently throughout the night. She does note that she has been told that she snores a lot, and loudly. She notes that she never falls asleep while driving, and never feels refreshed when she wakes up.   The pt takes Megase for menorrhagia, and had an [place] which revealed a uterine fibroid. She also takes blood pressure medication and notes successful control of her hypertension.   The pt notes that she has smoked cigarettes for several years, and in the last year or so she decreased to a half pack of cigarettes per day from one pack per day. She does have a carbon monoxide detector in her home.   She has a bulging disk in her back, and has painful neuropathy for which she takes Amitriptyline.  The pt notes that she does endorse some depression and has previously discussed this with a previous physician, and plans on addressing this  with her new PCP soon. She denies any suicidal or homicidal ideation. She notes that her depression is not affecting her work, but has been told by friends that they are concerned she is depressed. She notes that her depression is not continuous, but comes in certain times with some association with stress.   Most recent lab results (04/01/18) of CBC w/diff and CMP is as follows: all values are WNL except for WBC at 12.1k, RBC at 5.82, HGB at 17.6, HCT at 53.7, PLT at 466k, ANC at 7.8k, Monocytes abs at 1.0k, Glucose at 108, Alk Phos at 126.  On review of systems, pt reports frequent sinus infections, some fatigue, not sleeping well, depression, and denies fevers, chills, night sweats, concerns for current infection, suicidal ideation, homicidal ideation, abdominal pains, leg swelling, and any other symptoms.   On PMHx the pt reports neuropathy, bulging disc, high blood pressure and denies asthma On Social Hx the pt reports that she smokes half pack cigarettes a day, previously one pack per day. She endorses occasional ETOH consumption, some weekends socially.  On Family Hx the pt reports an uncle who died from leukemia.    MEDICAL HISTORY:  Past Medical History:  Diagnosis Date  . Anxiety    history - no meds  . Arthritis    bulging disc L4/5  L5/S1  . Cystitis 07/2008   under care of [facility]  .  Depression    history - no meds  . H/O hiatal hernia    no meds - diet controlled  . Hepatitis    History Hep A at age 36 yrs - no prev problems  . Hyperlipidemia   . Macular degeneration 2013   Age related per pt - no meds  . Pain, dental 2010   history - teeth ext per patient  . Plantar fasciitis    s/p left gastroc slide   . S/P dilatation of esophageal stricture 2006-2008    SURGICAL HISTORY: Past Surgical History:  Procedure Laterality Date  . CESAREAN SECTION     1991  . CHOLECYSTECTOMY  1991  . COLONOSCOPY    . dilatation of esophageal stricture  2005-2008  .  DILATION AND EVACUATION  02/09/2012   Procedure: DILATATION AND EVACUATION;  Surgeon: Osborne Oman, MD;  Location: [facility];  Service: Gynecology;  Laterality: N/A;  With ultrasound guidance  . DILATION AND EVACUATION N/A 08/29/2014   Procedure: DILATATION AND EVACUATION (D&E) 2ND TRIMESTER;  Surgeon: Mora Bellman, MD;  Location: [facility];  Service: Gynecology;  Laterality: N/A;  . ESOPHAGOGASTRODUODENOSCOPY N/A 09/30/2017   Procedure: ESOPHAGOGASTRODUODENOSCOPY (EGD);  Surgeon: Wonda Horner, MD;  Location: [facility];  Service: Endoscopy;  Laterality: N/A;  . gastroc slide  2005   left side   . Pancystourethroscopy    . plantar fasciitis     left foot  . SVD  1999   x 1    SOCIAL HISTORY: Social History   Socioeconomic History  . Marital status: Single    Spouse name: Not on file  . Number of children: Not on file  . Years of education: Not on file  . Highest education level: Not on file  Occupational History  . Not on file  Social Needs  . Financial resource strain: Not on file  . Food insecurity:    Worry: Not on file    Inability: Not on file  . Transportation needs:    Medical: Not on file    Non-medical: Not on file  Tobacco Use  . Smoking status: Current Every Day Smoker    Packs/day: 0.25    Years: 8.00    Pack years: 2.00    Types: Cigarettes  . Smokeless tobacco: Never Used  . Tobacco comment: 4-5 cigs/day  Substance and Sexual Activity  . Alcohol use: No    Comment: rarely  . Drug use: No  . Sexual activity: Yes    Birth control/protection: None  Lifestyle  . Physical activity:    Days per week: Not on file    Minutes per session: Not on file  . Stress: Not on file  Relationships  . Social connections:    Talks on phone: Not on file    Gets together: Not on file    Attends religious service: Not on file    Active member of club or organization: Not on file    Attends meetings of clubs or organizations: Not on file    Relationship status: Not  on file  . Intimate partner violence:    Fear of current or ex partner: Not on file    Emotionally abused: Not on file    Physically abused: Not on file    Forced sexual activity: Not on file  Other Topics Concern  . Not on file  Social History Narrative   Current smoker 1/2 PPD   Denies alcohol use   2 children  FAMILY HISTORY: Family History  Problem Relation Age of Onset  . Diabetes Mother   . Hyperlipidemia Mother   . Stroke Mother   . Hypertension Mother     ALLERGIES:  is allergic to penicillins and azithromycin.  MEDICATIONS:  Current Outpatient Medications  Medication Sig Dispense Refill  . amitriptyline (ELAVIL) 50 MG tablet Take 50 mg by mouth at bedtime.    . medroxyPROGESTERone (PROVERA) 5 MG tablet Take 1 tablet (5 mg total) by mouth daily for 5 days. 5 tablet 0  . megestrol (MEGACE) 40 MG tablet Take 1 tablet (40 mg total) by mouth daily. 30 tablet 3  . pantoprazole (PROTONIX) 40 MG tablet Take 1 tablet (40 mg total) by mouth 2 (two) times daily. 30 tablet 1   No current facility-administered medications for this visit.     REVIEW OF SYSTEMS:    10 Point review of Systems was done is negative except as noted above.  PHYSICAL EXAMINATION:  . Vitals:   06/29/18 1003  BP: 119/80  Pulse: (!) 122  Resp: 18  Temp: 98.6 F (37 C)  SpO2: 99%   Filed Weights   06/29/18 1003  Weight: 168 lb 6.4 oz (76.4 kg)   .Body mass index is 32.89 kg/m.  GENERAL:alert, in no acute distress and comfortable SKIN: no acute rashes, no significant lesions EYES: conjunctiva are pink and non-injected, sclera anicteric OROPHARYNX: MMM, no exudates, no oropharyngeal erythema or ulceration NECK: supple, no JVD LYMPH:  no palpable lymphadenopathy in the cervical, axillary or inguinal regions LUNGS: clear to auscultation b/l with normal respiratory effort HEART: regular rate & rhythm ABDOMEN:  normoactive bowel sounds , non tender, not distended. No palpable  hepatosplenomegaly. Extremity: no pedal edema PSYCH: alert & oriented x 3 with fluent speech NEURO: no focal motor/sensory deficits  LABORATORY DATA:  I have reviewed the data as listed  . CBC Latest Ref Rng & Units 06/29/2018 12/21/2017 09/05/2014  WBC 4.0 - 10.5 K/uL 9.6 10.0 8.7  Hemoglobin 12.0 - 15.0 g/dL 16.4(H) 14.1 13.0  Hematocrit 36.0 - 46.0 % 47.7(H) 41.9 36.3  Platelets 150 - 400 K/uL 306 343 273    . CMP Latest Ref Rng & Units 06/29/2018 03/15/2018 10/01/2012  Glucose 70 - 99 mg/dL 94 105(H) 113(H)  BUN 6 - 20 mg/dL '13 9 6  ' Creatinine 0.44 - 1.00 mg/dL 0.83 0.74 0.64  Sodium 135 - 145 mmol/L 141 141 139  Potassium 3.5 - 5.1 mmol/L 3.4(L) 2.9(L) 3.1(L)  Chloride 98 - 111 mmol/L 97(L) 99 101  CO2 22 - 32 mmol/L '31 23 31  ' Calcium 8.9 - 10.3 mg/dL 10.4(H) 9.6 9.4  Total Protein 6.5 - 8.1 g/dL 7.9 - 6.8  Total Bilirubin 0.3 - 1.2 mg/dL 0.4 - 0.3  Alkaline Phos 38 - 126 U/L 114 - 67  AST 15 - 41 U/L 32 - 12  ALT 0 - 44 U/L 60(H) - 10   04/01/18 CBC w/diff:  04/01/18 CMP:    RADIOGRAPHIC STUDIES: I have personally reviewed the radiological images as listed and agreed with the findings in the report. No results found.  ASSESSMENT & PLAN:  47 y.o. female with  1. Polycythemia PLAN -Discussed patient's most recent labs from 04/01/18, neutrophilia with ANC at 7.8k, RBC at 5.82, HGB at 17.6, HCT at 53.7, thrombocytosis with PLT at 466k -Review of patient's labs from 12/21/17 showed all blood counts WNL including RBC at 4.49, HGB at 14.1, and HCT at 41.9.  -Would like to check  labs today (ordered as noted below) - improved polycythemia hgb/hct 16.4/47.7 and resolution of thrombocytosis. -Discussed causes of polycythemia - reactive or secondary polycythemia vs primary bone marrow disorder polycythemia vera  -Discussed that smoking can cause polycythemia by causing low oxygen levels -Will see the pt back in 2 weeks to review lab results  . Orders Placed This Encounter    Procedures  . CBC with Differential/Platelet    Standing Status:   Future    Number of Occurrences:   1    Standing Expiration Date:   08/03/2019  . CMP ([place] only)    Standing Status:   Future    Number of Occurrences:   1    Standing Expiration Date:   06/30/2019  . Ferritin    Standing Status:   Future    Number of Occurrences:   1    Standing Expiration Date:   06/30/2019  . JAK2 (including V617F and Exon 12), MPL, and CALR-Next Generation Sequencing    Standing Status:   Future    Number of Occurrences:   1    Standing Expiration Date:   06/29/2019  . BCR ABL1 FISH ([employer])    Standing Status:   Future    Number of Occurrences:   1    Standing Expiration Date:   06/30/2019  . Erythropoietin    Standing Status:   Future    Number of Occurrences:   1    Standing Expiration Date:   06/29/2019    Labs today RTC with Dr Irene Limbo in 2 weeks    All of the patients questions were answered with apparent satisfaction. The patient knows to call the clinic with any problems, questions or concerns.  The total time spent in the appt was 45 minutes and more than 50% was on counseling and direct patient cares.    Sullivan Lone MD MS AAHIVMS [facility] Hematology/Oncology Physician [facility]  (Office):       [phone] (Work cell):  [phone] (Fax):           [phone]  06/29/2018 11:14 AM  I, Baldwin [place], am acting as a scribe for Dr. Irene Limbo  .I have reviewed the above documentation for accuracy and completeness, and I agree with the above. Brunetta Genera MD

## 2018-06-29 NOTE — Telephone Encounter (Signed)
Scheduled appt per 10/17 los - gave patient aVS and calender per los.   

## 2018-06-30 LAB — ERYTHROPOIETIN: Erythropoietin: 4.4 m[IU]/mL (ref 2.6–18.5)

## 2018-07-11 ENCOUNTER — Telehealth: Payer: Self-pay | Admitting: *Deleted

## 2018-07-11 NOTE — Telephone Encounter (Signed)
[name] [name] [phone]).  "I cannot come in Friday.  Could appointment be rescheduled to Wednesday or Thursday of this week or next Wed., Thurs or Fri.?"  Scheduling message sent.

## 2018-07-12 ENCOUNTER — Telehealth: Payer: Self-pay | Admitting: Hematology

## 2018-07-12 LAB — BCR ABL1 FISH (GENPATH)

## 2018-07-12 LAB — JAK2 (INCLUDING V617F AND EXON 12), MPL,& CALR-NEXT GEN SEQ

## 2018-07-12 NOTE — Telephone Encounter (Signed)
R/s appt per 10/29 sch message - pt aware of appt - r/s to after GK gets back

## 2018-07-14 ENCOUNTER — Ambulatory Visit: Payer: 59 | Admitting: Hematology

## 2018-08-08 NOTE — Progress Notes (Signed)
HEMATOLOGY/ONCOLOGY CLINIC NOTE  Date of Service: 08/09/2018  Patient Care Team: Shanon Rosser, PA-C as PCP - General (Physician Assistant)  Dr. Shanon Rosser as PCP   CHIEF COMPLAINTS/PURPOSE OF CONSULTATION:  Concern for Polycythemia vera   HISTORY OF PRESENTING ILLNESS:   [name] [name] is a wonderful 47 y.o. female who has been referred to [place] by Dr. Nita Sickle for evaluation and management of her concern for Polycythemia vera. The pt reports that she is doing well overall.   The pt reports that at the time of her last labs in July she had a sinus infection, which was the occasion for work up. She endorses frequent sins infections. She notes that she has not known of higher red blood cells prior to this, and her April blood work did not reflect high red blood cell counts.    The pt notes that she has felt very tired for a while, and that this has become slightly worse in the last 6 months and endorses a gradual onset, denying any acute life changes or medication changes. She notes that she always stays very hydrated. She describes feeling tired much of the time, but wakes up frequently throughout the night. She does note that she has been told that she snores a lot, and loudly. She notes that she never falls asleep while driving, and never feels refreshed when she wakes up.   The pt takes Megase for menorrhagia, and had an [place] which revealed a uterine fibroid. She also takes blood pressure medication and notes successful control of her hypertension.   The pt notes that she has smoked cigarettes for several years, and in the last year or so she decreased to a half pack of cigarettes per day from one pack per day. She does have a carbon monoxide detector in her home.   She has a bulging disk in her back, and has painful neuropathy for which she takes Amitriptyline.  The pt notes that she does endorse some depression and has previously discussed this with a previous physician, and plans on  addressing this with her new PCP soon. She denies any suicidal or homicidal ideation. She notes that her depression is not affecting her work, but has been told by friends that they are concerned she is depressed. She notes that her depression is not continuous, but comes in certain times with some association with stress.   Most recent lab results (04/01/18) of CBC w/diff and CMP is as follows: all values are WNL except for WBC at 12.1k, RBC at 5.82, HGB at 17.6, HCT at 53.7, PLT at 466k, ANC at 7.8k, Monocytes abs at 1.0k, Glucose at 108, Alk Phos at 126.  On review of systems, pt reports frequent sinus infections, some fatigue, not sleeping well, depression, and denies fevers, chills, night sweats, concerns for current infection, suicidal ideation, homicidal ideation, abdominal pains, leg swelling, and any other symptoms.   On PMHx the pt reports neuropathy, bulging disc, high blood pressure and denies asthma On Social Hx the pt reports that she smokes half pack cigarettes a day, previously one pack per day. She endorses occasional ETOH consumption, some weekends socially.  On Family Hx the pt reports an uncle who died from leukemia.   Interval History:   [name] returns today for management and evaluation of her polycythemia. The patient's last visit with [place] was on 06/29/18. The pt reports that she is doing well overall.   The pt reports that she  is continuing to smoke a half pack of cigarettes each day. She notes that she has had stable energy levels. She notes that she recently stopped Megace and had been gaining weight prior to this.   Lab results (06/29/18) of CBC w/diff and CMP is as follows: all values are WNL except for RBC at 5.12, HGB at 16.4, HCT at 47.7, Potassium at 3.4, Chloride at 97, Calcium at 10.4, ALT at 60. 08/09/18 Erythropoietin at 4.4 08/09/18 Ferritin at 29  On review of systems, pt reports stable energy levels, weight gain, eating well, stable ankle swelling, and  denies abdominal pains, and any other symptoms.   MEDICAL HISTORY:  Past Medical History:  Diagnosis Date  . Anxiety    history - no meds  . Arthritis    bulging disc L4/5  L5/S1  . Cystitis 07/2008   under care of [facility]  . Depression    history - no meds  . H/O hiatal hernia    no meds - diet controlled  . Hepatitis    History Hep A at age 3 yrs - no prev problems  . Hyperlipidemia   . Macular degeneration 2013   Age related per pt - no meds  . Pain, dental 2010   history - teeth ext per patient  . Plantar fasciitis    s/p left gastroc slide   . S/P dilatation of esophageal stricture 2006-2008    SURGICAL HISTORY: Past Surgical History:  Procedure Laterality Date  . CESAREAN SECTION     1991  . CHOLECYSTECTOMY  1991  . COLONOSCOPY    . dilatation of esophageal stricture  2005-2008  . DILATION AND EVACUATION  02/09/2012   Procedure: DILATATION AND EVACUATION;  Surgeon: Osborne Oman, MD;  Location: [facility];  Service: Gynecology;  Laterality: N/A;  With ultrasound guidance  . DILATION AND EVACUATION N/A 08/29/2014   Procedure: DILATATION AND EVACUATION (D&E) 2ND TRIMESTER;  Surgeon: Mora Bellman, MD;  Location: [facility];  Service: Gynecology;  Laterality: N/A;  . ESOPHAGOGASTRODUODENOSCOPY N/A 09/30/2017   Procedure: ESOPHAGOGASTRODUODENOSCOPY (EGD);  Surgeon: Wonda Horner, MD;  Location: [facility];  Service: Endoscopy;  Laterality: N/A;  . gastroc slide  2005   left side   . Pancystourethroscopy    . plantar fasciitis     left foot  . SVD  1999   x 1    SOCIAL HISTORY: Social History   Socioeconomic History  . Marital status: Single    Spouse name: Not on file  . Number of children: Not on file  . Years of education: Not on file  . Highest education level: Not on file  Occupational History  . Not on file  Social Needs  . Financial resource strain: Not on file  . Food insecurity:    Worry: Not on file    Inability: Not on file  .  Transportation needs:    Medical: Not on file    Non-medical: Not on file  Tobacco Use  . Smoking status: Current Every Day Smoker    Packs/day: 0.25    Years: 8.00    Pack years: 2.00    Types: Cigarettes  . Smokeless tobacco: Never Used  . Tobacco comment: 4-5 cigs/day  Substance and Sexual Activity  . Alcohol use: No    Comment: rarely  . Drug use: No  . Sexual activity: Yes    Birth control/protection: None  Lifestyle  . Physical activity:    Days per week: Not on  file    Minutes per session: Not on file  . Stress: Not on file  Relationships  . Social connections:    Talks on phone: Not on file    Gets together: Not on file    Attends religious service: Not on file    Active member of club or organization: Not on file    Attends meetings of clubs or organizations: Not on file    Relationship status: Not on file  . Intimate partner violence:    Fear of current or ex partner: Not on file    Emotionally abused: Not on file    Physically abused: Not on file    Forced sexual activity: Not on file  Other Topics Concern  . Not on file  Social History Narrative   Current smoker 1/2 PPD   Denies alcohol use   2 children     FAMILY HISTORY: Family History  Problem Relation Age of Onset  . Diabetes Mother   . Hyperlipidemia Mother   . Stroke Mother   . Hypertension Mother     ALLERGIES:  is allergic to penicillins and azithromycin.  MEDICATIONS:  Current Outpatient Medications  Medication Sig Dispense Refill  . amitriptyline (ELAVIL) 50 MG tablet Take 50 mg by mouth at bedtime.    . medroxyPROGESTERone (PROVERA) 5 MG tablet Take 1 tablet (5 mg total) by mouth daily for 5 days. 5 tablet 0  . megestrol (MEGACE) 40 MG tablet Take 1 tablet (40 mg total) by mouth daily. 30 tablet 3  . pantoprazole (PROTONIX) 40 MG tablet Take 1 tablet (40 mg total) by mouth 2 (two) times daily. 30 tablet 1   No current facility-administered medications for this visit.     REVIEW  OF SYSTEMS:    A 10+ POINT REVIEW OF SYSTEMS WAS OBTAINED including neurology, dermatology, psychiatry, cardiac, respiratory, lymph, extremities, GI, GU, Musculoskeletal, constitutional, breasts, reproductive, HEENT.  All pertinent positives are noted in the HPI.  All others are negative.   PHYSICAL EXAMINATION:  . Vitals:   08/09/18 1043  BP: (!) 159/84  Pulse: (!) 105  Resp: 18  Temp: 98.1 F (36.7 C)  SpO2: 100%   Filed Weights   08/09/18 1043  Weight: 175 lb 9.6 oz (79.7 kg)   .Body mass index is 34.29 kg/m.  GENERAL:alert, in no acute distress and comfortable SKIN: no acute rashes, no significant lesions EYES: conjunctiva are pink and non-injected, sclera anicteric OROPHARYNX: MMM, no exudates, no oropharyngeal erythema or ulceration NECK: supple, no JVD LYMPH:  no palpable lymphadenopathy in the cervical, axillary or inguinal regions LUNGS: clear to auscultation b/l with normal respiratory effort HEART: regular rate & rhythm ABDOMEN:  normoactive bowel sounds , non tender, not distended. No palpable hepatosplenomegaly.  Extremity: 1+ pedal edema PSYCH: alert & oriented x 3 with fluent speech NEURO: no focal motor/sensory deficits   LABORATORY DATA:  I have reviewed the data as listed  . CBC Latest Ref Rng & Units 06/29/2018 12/21/2017 09/05/2014  WBC 4.0 - 10.5 K/uL 9.6 10.0 8.7  Hemoglobin 12.0 - 15.0 g/dL 16.4(H) 14.1 13.0  Hematocrit 36.0 - 46.0 % 47.7(H) 41.9 36.3  Platelets 150 - 400 K/uL 306 343 273    . CMP Latest Ref Rng & Units 06/29/2018 03/15/2018 10/01/2012  Glucose 70 - 99 mg/dL 94 105(H) 113(H)  BUN 6 - 20 mg/dL '13 9 6  ' Creatinine 0.44 - 1.00 mg/dL 0.83 0.74 0.64  Sodium 135 - 145 mmol/L 141 141 139  Potassium 3.5 - 5.1 mmol/L 3.4(L) 2.9(L) 3.1(L)  Chloride 98 - 111 mmol/L 97(L) 99 101  CO2 22 - 32 mmol/L '31 23 31  ' Calcium 8.9 - 10.3 mg/dL 10.4(H) 9.6 9.4  Total Protein 6.5 - 8.1 g/dL 7.9 - 6.8  Total Bilirubin 0.3 - 1.2 mg/dL 0.4 - 0.3    Alkaline Phos 38 - 126 U/L 114 - 67  AST 15 - 41 U/L 32 - 12  ALT 0 - 44 U/L 60(H) - 10   04/01/18 CBC w/diff:  04/01/18 CMP:    06/29/18 BCR ABL:   06/29/18 Mutation study:    RADIOGRAPHIC STUDIES: I have personally reviewed the radiological images as listed and agreed with the findings in the report. No results found.  ASSESSMENT & PLAN:  47 y.o. female with  1. Polycythemia  PLAN -Labs upon initial presentation from 04/01/18, neutrophilia with ANC at 7.8k, RBC at 5.82, HGB at 17.6, HCT at 53.7, thrombocytosis with PLT at 466k -Review of patient's labs from 12/21/17 showed all blood counts WNL including RBC at 4.49, HGB at 14.1, and HCT at 41.9.  -Discussed pt labwork from 06/29/18; Hgb decreased to 16.4, HCT decreased to 47.7%, Ferritin at 29, thrombocytosis resolved with PLT to 306k. Potassium slightly low at 3.4. Calcium slightly high at 10.4 -06/29/18 BCR-ABL molecular pathology report revealed no evidence of BCR-ABL rearrangement -06/29/18 Mutation study was negative for JAK2 mutation, MPL, or CalR mutations -Discussed that as the patient's counts have improved by themselves, and as molecular studies were negative, the pt's polycythemia is secondary to smoking and possibly dehydration -Counseled the pt towards complete smoking cessation and discussed that this can cause secondary polycythemia by creating a low oxygen environment -Recommend patient follow up with PCP regarding slightly higher calcium levels -Recommend graded sports compression socks for bilateral ankle swelling  -Will be happy to see the pt back as needed    No orders of the defined types were placed in this encounter.   RTC with Dr Irene Limbo as needed   All of the patients questions were answered with apparent satisfaction. The patient knows to call the clinic with any problems, questions or concerns.  The total time spent in the appt was 20 minutes and more than 50% was on counseling and direct patient  cares.   Sullivan Lone MD MS AAHIVMS [facility] Hematology/Oncology Physician [facility]  (Office):       [phone] (Work cell):  [phone] (Fax):           [phone]  08/09/2018 11:10 AM  I, Baldwin Jamaica, am acting as a scribe for Dr. Sullivan Lone.   .I have reviewed the above documentation for accuracy and completeness, and I agree with the above. Brunetta Genera MD

## 2018-08-09 ENCOUNTER — Inpatient Hospital Stay: Payer: 59 | Attending: Hematology | Admitting: Hematology

## 2018-08-09 VITALS — BP 159/84 | HR 105 | Temp 98.1°F | Resp 18 | Ht 60.0 in | Wt 175.6 lb

## 2018-08-09 DIAGNOSIS — F17210 Nicotine dependence, cigarettes, uncomplicated: Secondary | ICD-10-CM | POA: Diagnosis not present

## 2018-08-09 DIAGNOSIS — D751 Secondary polycythemia: Secondary | ICD-10-CM | POA: Diagnosis not present

## 2018-08-11 ENCOUNTER — Telehealth: Payer: Self-pay

## 2018-08-11 DIAGNOSIS — Z0100 Encounter for examination of eyes and vision without abnormal findings: Secondary | ICD-10-CM | POA: Diagnosis not present

## 2018-08-11 NOTE — Telephone Encounter (Signed)
RTC with Dr Irene Limbo as needed. Per 11/27 los

## 2018-11-01 ENCOUNTER — Encounter: Payer: Self-pay | Admitting: Radiology

## 2018-11-29 DIAGNOSIS — M5416 Radiculopathy, lumbar region: Secondary | ICD-10-CM | POA: Diagnosis not present

## 2018-11-29 DIAGNOSIS — Z79899 Other long term (current) drug therapy: Secondary | ICD-10-CM | POA: Diagnosis not present

## 2018-12-04 DIAGNOSIS — M545 Low back pain: Secondary | ICD-10-CM | POA: Diagnosis not present

## 2019-02-22 ENCOUNTER — Telehealth: Payer: Self-pay | Admitting: *Deleted

## 2019-02-22 ENCOUNTER — Encounter: Payer: Self-pay | Admitting: *Deleted

## 2019-02-22 NOTE — Telephone Encounter (Signed)
Called pt & LVM asking for call back to update her chart for VV on Tues 6/16. Left office number and hours in message.

## 2019-02-26 ENCOUNTER — Encounter: Payer: Self-pay | Admitting: *Deleted

## 2019-02-26 NOTE — Telephone Encounter (Signed)
Called pt again today to update her chart before VV on 6/17. Line busy x 2.

## 2019-02-27 ENCOUNTER — Other Ambulatory Visit: Payer: Self-pay

## 2019-02-27 ENCOUNTER — Telehealth: Payer: Self-pay | Admitting: *Deleted

## 2019-02-27 ENCOUNTER — Telehealth: Payer: Self-pay | Admitting: Neurology

## 2019-02-27 ENCOUNTER — Ambulatory Visit: Payer: 59 | Admitting: Neurology

## 2019-02-27 NOTE — Telephone Encounter (Signed)
Pt was a no show 02/27/19 called and lvm for pt to call [facility] to r/s apt/bws

## 2019-02-27 NOTE — Telephone Encounter (Signed)
Pt no showed doxy appt this morning. Dr. Jaynee Eagles states she called pt as well and no one answered.

## 2019-06-15 ENCOUNTER — Telehealth: Payer: Self-pay | Admitting: *Deleted

## 2019-06-15 NOTE — Telephone Encounter (Signed)
Records faxed to [place] - Release [id number]

## 2020-01-10 DIAGNOSIS — M5416 Radiculopathy, lumbar region: Secondary | ICD-10-CM | POA: Insufficient documentation

## 2020-07-26 ENCOUNTER — Other Ambulatory Visit: Payer: Self-pay

## 2020-07-26 ENCOUNTER — Encounter (HOSPITAL_COMMUNITY): Payer: Self-pay | Admitting: Emergency Medicine

## 2020-07-26 ENCOUNTER — Emergency Department (HOSPITAL_COMMUNITY): Payer: Self-pay

## 2020-07-26 ENCOUNTER — Emergency Department (HOSPITAL_COMMUNITY)
Admission: EM | Admit: 2020-07-26 | Discharge: 2020-07-26 | Disposition: A | Discharge status: Home / Self Care | Payer: Self-pay | Attending: Emergency Medicine | Admitting: Emergency Medicine

## 2020-07-26 DIAGNOSIS — M25551 Pain in right hip: Secondary | ICD-10-CM | POA: Insufficient documentation

## 2020-07-26 DIAGNOSIS — F17210 Nicotine dependence, cigarettes, uncomplicated: Secondary | ICD-10-CM | POA: Insufficient documentation

## 2020-07-26 DIAGNOSIS — M25511 Pain in right shoulder: Secondary | ICD-10-CM

## 2020-07-26 DIAGNOSIS — Z79899 Other long term (current) drug therapy: Secondary | ICD-10-CM | POA: Insufficient documentation

## 2020-07-26 DIAGNOSIS — I10 Essential (primary) hypertension: Secondary | ICD-10-CM | POA: Insufficient documentation

## 2020-07-26 DIAGNOSIS — M25559 Pain in unspecified hip: Secondary | ICD-10-CM

## 2020-07-26 LAB — URINALYSIS, ROUTINE W REFLEX MICROSCOPIC
Bilirubin Urine: NEGATIVE
Glucose, UA: NEGATIVE mg/dL
Ketones, ur: NEGATIVE mg/dL
Leukocytes,Ua: NEGATIVE
Nitrite: NEGATIVE
Protein, ur: NEGATIVE mg/dL
Specific Gravity, Urine: 1.01 (ref 1.005–1.030)
pH: 5 (ref 5.0–8.0)

## 2020-07-26 MED ORDER — HYDROCODONE-ACETAMINOPHEN 5-325 MG PO TABS
1.0000 | ORAL_TABLET | Freq: Four times a day (QID) | ORAL | 0 refills | Status: DC | PRN
Start: 2020-07-26 — End: 2022-04-05

## 2020-07-26 MED ORDER — LIDOCAINE 5 % EX PTCH
1.0000 | MEDICATED_PATCH | Freq: Once | CUTANEOUS | Status: DC
  Administered 2020-07-26: 1 via TRANSDERMAL
  Filled 2020-07-26: qty 1

## 2020-07-26 MED ORDER — KETOROLAC TROMETHAMINE 15 MG/ML IJ SOLN
15.0000 mg | Freq: Once | INTRAMUSCULAR | Status: AC
  Administered 2020-07-26: 15 mg via INTRAMUSCULAR
  Filled 2020-07-26: qty 1

## 2020-07-26 MED ORDER — PREDNISONE 20 MG PO TABS
60.0000 mg | ORAL_TABLET | Freq: Once | ORAL | Status: AC
  Administered 2020-07-26: 60 mg via ORAL
  Filled 2020-07-26: qty 3

## 2020-07-26 MED ORDER — PREDNISONE 20 MG PO TABS: 20.0000 mg | ORAL_TABLET | Freq: Two times a day (BID) | ORAL | 0 refills | Status: AC

## 2020-07-26 NOTE — ED Provider Notes (Signed)
[place] DEPT Provider Note   CSN: [id number] Arrival date & time: 07/26/20  1120     History Chief Complaint  Patient presents with  . Hip Pain  . Shoulder Pain    [name] [name] is a 49 y.o. right-hand-dominant female with past medical history significant for anxiety, arthritis, chronic back pain, GERD, hypertension hyperlipidemia, plantar fasciitis.  HPI Patient presents to emergency room today with chief complaint of progressively worsening right hip and shoulder pain x1 week.  Patient states she has a history of bulging disks in her back and has been followed by [facility] orthopedics in the past however has been unable to see them after losing her job and insurance.  She called the office last month and was prescribed a short course of pain medicine however was told that she needed more she would need to be seen in the office.  She states she unfortunately does not have money to cover the co-pay. She is endorsing pain in her right hip that radiates down her right leg.  She states it is worse than her usual leg pain.  She woke up in tears and had difficulty moving her leg.  She denies any recent fall or injury.  She took ibuprofen and thinks it helped her pain.  She currently rates it 5 out of 10 in severity. She is also reporting that her shoulder pain worsened this morning.  She admits to worsening pain with movement and therefore decreased range of motion.  Again denies any injury to the shoulder.  She does admit to increased urinary frequency however denies any associated dysuria or gross hematuria.  Does not member exactly how long this is been going on for. LMP over 1 year ago, thinks she is going through early menopause. Denies any fever, chills, cough or shortness of breath, chest pain, pelvic pain, urinary symptoms, diarrhea  Past Medical History:  Diagnosis Date  . Anxiety    history - no meds  . Arthritis    bulging disc L4/5  L5/S1  .  Chronic back pain   . Cystitis 07/2008   under care of [facility]  . Depression    history - no meds  . GERD (gastroesophageal reflux disease)   . H/O hiatal hernia    no meds - diet controlled  . Hepatitis    History Hep A at age 44 yrs - no prev problems  . Hyperlipidemia   . Hypertension   . Insomnia   . Macular degeneration 2013   Age related per pt - no meds  . Pain, dental 2010   history - teeth ext per patient  . Plantar fasciitis    s/p left gastroc slide   . S/P dilatation of esophageal stricture 2006-2008    Patient Active Problem List   Diagnosis Date Noted  . Swelling of lower leg 03/15/2018  . Cervical high risk HPV (human papillomavirus) test positive 12/28/2017  . Abnormal vaginal bleeding 12/21/2017  . Encntr for gyn exam (general) (routine) w abnormal findings 09/24/2016  . Insomnia 09/09/2014  . Bereavement 09/09/2014  . Missed abortion   . Hematuria, undiagnosed cause 11/26/2012  . Previous cesarean delivery, antepartum condition or complication 41/66/0630  . Recurrent UTI 04/12/2011  . LEG PAIN, BILATERAL 10/16/2009  . Chronic interstitial cystitis 05/13/2009  . BACK PAIN 05/13/2009  . ANXIETY 10/12/2006  . TOBACCO ABUSE 10/12/2006  . DEPRESSION 10/12/2006  . ESOPHAGEAL STRICTURE 10/12/2006  . GERD 10/12/2006    Past  Surgical History:  Procedure Laterality Date  . CESAREAN SECTION     1991  . CHOLECYSTECTOMY  1991  . COLONOSCOPY    . dilatation of esophageal stricture  2005-2008  . DILATION AND EVACUATION  02/09/2012   Procedure: DILATATION AND EVACUATION;  Surgeon: Osborne Oman, MD;  Location: [facility];  Service: Gynecology;  Laterality: N/A;  With ultrasound guidance  . DILATION AND EVACUATION N/A 08/29/2014   Procedure: DILATATION AND EVACUATION (D&E) 2ND TRIMESTER;  Surgeon: Mora Bellman, MD;  Location: [place] [facility];  Service: Gynecology;  Laterality: N/A;  . ESOPHAGOGASTRODUODENOSCOPY N/A 09/30/2017   Procedure:  ESOPHAGOGASTRODUODENOSCOPY (EGD);  Surgeon: Wonda Horner, MD;  Location: [facility]COPY;  Service: Endoscopy;  Laterality: N/A;  . gastroc slide  2005   left side   . Pancystourethroscopy    . plantar fasciitis     left foot  . SVD  1999   x 1     OB History    Gravida  4   Para  2   Term  2   Preterm      AB  1   Living  2     SAB  1   TAB      Ectopic      Multiple      Live Births  2           Family History  Problem Relation Age of Onset  . Diabetes Mother   . Hyperlipidemia Mother   . Stroke Mother   . Hypertension Mother   . Arthritis Mother   . Heart disease Father   . Hypertension Father   . Hypertension Brother     Social History   Tobacco Use  . Smoking status: Current Every Day Smoker    Packs/day: 0.25    Years: 8.00    Pack years: 2.00    Types: Cigarettes  . Smokeless tobacco: Never Used  . Tobacco comment: 4-5 cigs/day  Substance Use Topics  . Alcohol use: No    Comment: rarely  . Drug use: No    Home Medications Prior to Admission medications   Medication Sig Start Date End Date Taking? Authorizing Provider  amitriptyline (ELAVIL) 50 MG tablet Take 50 mg by mouth at bedtime.    [facility]  benzonatate (TESSALON) 100 MG capsule Take 100 mg by mouth.    [provider]  cefdinir (OMNICEF) 300 MG capsule Take 300 mg by mouth.    [provider]  clindamycin (CLEOCIN) 300 MG capsule Take 300 mg by mouth.    [provider]  HYDROcodone-acetaminophen (NORCO/VICODIN) 5-325 MG tablet Take 1 tablet by mouth every 6 (six) hours as needed for severe pain. 07/26/20   Walisiewicz, Harley Hallmark, PA-C  lisinopril-hydrochlorothiazide (ZESTORETIC) 20-12.5 MG tablet Take 1 tablet by mouth daily.    [provider]  olmesartan-hydrochlorothiazide (BENICAR HCT) 20-12.5 MG tablet TK 1 T PO QD 07/26/18   [provider]  ondansetron (ZOFRAN-ODT) 8 MG disintegrating tablet Take 8 mg by mouth.     [provider]  pantoprazole (PROTONIX) 40 MG tablet Take 1 tablet (40 mg total) by mouth 2 (two) times daily. 09/30/17 10/30/17  Wonda Horner, MD  predniSONE (DELTASONE) 20 MG tablet Take 1 tablet (20 mg total) by mouth 2 (two) times daily for 4 days. 07/27/20 07/31/20  Barrie Folk, PA-C    Allergies    Penicillins and Azithromycin  Review of Systems   Review of Systems  All other systems are reviewed and are negative for acute change except as noted in the HPI.   Physical Exam Updated Vital Signs BP 133/77 (BP Location: Right Arm)   Pulse 100   Temp 98.8 F (37.1 C) (Oral)   Resp 20   Ht 5' (1.524 m)   Wt 78.9 kg   SpO2 97%   BMI 33.98 kg/m   Physical Exam Vitals and nursing note reviewed.  Constitutional:      General: She is not in acute distress.    Appearance: She is not ill-appearing.  HENT:     Head: Normocephalic and atraumatic.     Right Ear: Tympanic membrane and external ear normal.     Left Ear: Tympanic membrane and external ear normal.     Nose: Nose normal.     Mouth/Throat:     Mouth: Mucous membranes are moist.     Pharynx: Oropharynx is clear.  Eyes:     General: No scleral icterus.       Right eye: No discharge.        Left eye: No discharge.     Extraocular Movements: Extraocular movements intact.     Conjunctiva/sclera: Conjunctivae normal.     Pupils: Pupils are equal, round, and reactive to light.  Neck:     Vascular: No JVD.  Cardiovascular:     Rate and Rhythm: Normal rate and regular rhythm.     Pulses: Normal pulses.          Radial pulses are 2+ on the right side and 2+ on the left side.       Dorsalis pedis pulses are 2+ on the right side and 2+ on the left side.     Heart sounds: Normal heart sounds.  Pulmonary:     Comments: Lungs clear to auscultation in all fields. Symmetric chest rise. No wheezing, rales, or rhonchi. Abdominal:     Comments: Abdomen is soft, non-distended, and non-tender in all quadrants.  No rigidity, no guarding. No peritoneal signs.  Musculoskeletal:     Cervical back: Normal range of motion.     Comments: Right shoulder with bony tenderness to palpation humeral head. Decreased ROM 2/2 to pain. Negative empty can test, negative Neer's, negative Lift off. No swelling, erythema or ecchymosis present. No step-off, crepitus, or deformity appreciated. 5/5 muscle strength of bilateral UE. 2+ radial pulse, sensation intact and all compartments soft.   Full ROM of right hip. No overlying skin changes, no erythema, no deformity or crepitus. Full ROM of right lower extremity. Compartments soft. DP pulse 2+ bilaterally.  Skin:    General: Skin is warm and dry.     Capillary Refill: Capillary refill takes less than 2 seconds.  Neurological:     Mental Status: She is oriented to person, place, and time.     GCS: GCS eye subscore is 4. GCS verbal subscore is 5. GCS motor subscore is 6.     Comments: Fluent speech, no facial droop.  Sensation grossly intact to light touch in the lower extremities bilaterally. No saddle anesthesias. Strength 5/5 with flexion and extension at the bilateral hips, knees, and ankles. No noted gait deficit. Coordination intact with heel to shin testing.  Psychiatric:        Behavior: Behavior normal.     ED Results / Procedures / Treatments   Labs (all labs ordered are listed, but only abnormal results are displayed) Labs Reviewed  URINALYSIS, ROUTINE W REFLEX MICROSCOPIC - Abnormal; Notable  for the following components:      Result Value   Hgb urine dipstick MODERATE (*)    Bacteria, UA RARE (*)    All other components within normal limits    EKG None  Radiology DG Shoulder Right  Result Date: 07/26/2020 CLINICAL DATA:  New onset nontraumatic right shoulder and right lateral hip pain. EXAM: RIGHT SHOULDER - 2+ VIEW COMPARISON:  None. FINDINGS: There is no evidence of fracture or dislocation. There is no evidence of arthropathy or other focal  bone abnormality. Soft tissues are unremarkable. IMPRESSION: Negative. Electronically Signed   By: Jacqulynn Cadet M.D.   On: 07/26/2020 13:35   DG Hip Unilat W or Wo Pelvis 2-3 Views Right  Result Date: 07/26/2020 CLINICAL DATA:  New onset nontraumatic right lateral hip pain EXAM: DG HIP (WITH OR WITHOUT PELVIS) 2-3V RIGHT COMPARISON:  None. FINDINGS: There is no evidence of hip fracture or dislocation. There is no evidence of arthropathy or other focal bone abnormality. IMPRESSION: Negative. Electronically Signed   By: Jacqulynn Cadet M.D.   On: 07/26/2020 13:36    Procedures Procedures (including critical care time)  Medications Ordered in ED Medications  lidocaine (LIDODERM) 5 % 1 patch (1 patch Transdermal Patch Applied 07/26/20 1409)  ketorolac (TORADOL) 15 MG/ML injection 15 mg (15 mg Intramuscular Given 07/26/20 1409)  predniSONE (DELTASONE) tablet 60 mg (60 mg Oral Given 07/26/20 1408)    ED Course  I have reviewed the triage vital signs and the nursing notes.  Pertinent labs & imaging results that were available during my care of the patient were reviewed by me and considered in my medical decision making (see chart for details).    MDM Rules/Calculators/A&P                          History provided by patient with additional history obtained from chart review.    45 female presenting with right hip and shoulder pain.  She is well-appearing in no acute distress.  She is afebrile, hemodynamically stable.  On exam she has decreased range of motion of her right shoulder because of pain.  She has chronic pain in her right hip however it has progressively worsened, exam is overall unremarkable.  Radial and pulses 2+ bilaterally.  She is ambulatory with normal gait.  Lower exam neuro exam is normal.  No signs of infection or septic joints.  She admitted to urinary frequency therefore UA was checked.  No signs of UTI.  She has moderate hemoglobinuria with no RBCs, she has history  of the same, do not feel that kidney stone is the cause of her symptoms based on her presentation and exam today. X-rays of her right shoulder and hip were obtained and viewed by me.  No signs of fracture dislocation.  Patient denies any history of kidney problems.  She was given IM Toradol and p.o. prednisone with a Lidoderm patch.  On reassessment she reports pain has significantly improved. I have reviewed the PDMP during this encounter. She has no current narcotic prescriptions.  Will discharge home with prednisone burst and 6 pills of hydrocodone for severe pain only.  She does not have a PCP currently, she was given information for [facility] and he had wellness clinic advised to follow-up there or with orthopedics where she is already an established patient.  The patient appears reasonably screened and/or stabilized for discharge and I doubt any other medical condition or other [facility] requiring  further screening, evaluation, or treatment in the ED at this time prior to discharge. The patient is safe for discharge with strict return precautions discussed.    Portions of this note were generated with [employer]. Dictation errors may occur despite best attempts at proofreading.     Final Clinical Impression(s) / ED Diagnoses Final diagnoses:  Acute pain of right shoulder  Hip pain    Rx / DC Orders ED Discharge Orders         Ordered    predniSONE (DELTASONE) 20 MG tablet  2 times daily        07/26/20 1519    HYDROcodone-acetaminophen (NORCO/VICODIN) 5-325 MG tablet  Every 6 hours PRN        07/26/20 1519           Barrie Folk, PA-C 07/26/20 1527    Daleen Bo, MD 07/27/20 1458

## 2020-07-26 NOTE — ED Notes (Signed)
ED Provider at bedside. 

## 2020-07-26 NOTE — Discharge Instructions (Addendum)
-  Prescription sent to the pharmacy for prednisone.  This is a steroid that can help with pain inflammation.  Start taking it tomorrow as you already had a dose here in the emergency department. Around $8.  -Prescription also sent to the pharmacy for [place].  This is for severe pain only.  Do not take it if you are going to be driving or working as it can make you drowsy. Around $5. You will need to see ortho or primary care doctor if you need refills on your pain medication.  It cannot be refilled in the emergency department.  -You can also try buying over-the-counter lidocaine patches to wear on your hip or shoulder if that helps.  You can use a heating pad or apply ice if that helps as well.  You need to follow-up with your primary care doctor.  We have given you information for the [facility] health community health and wellness clinic.  You can call the office Monday and schedule the next available appointment.

## 2020-07-26 NOTE — ED Notes (Signed)
Pt ambulatory to restroom at this time without assistance. Urine cup provided.

## 2020-07-26 NOTE — ED Notes (Signed)
Patient transported to XR at this time.

## 2020-07-26 NOTE — ED Triage Notes (Signed)
Patient states she has bulging disks in her back, But she has new onset of right hip pain, and hurts her when she walks Also complaining of right shoulder pain

## 2021-04-12 ENCOUNTER — Emergency Department (HOSPITAL_COMMUNITY)
Admission: EM | Admit: 2021-04-12 | Discharge: 2021-04-12 | Disposition: A | Discharge status: Home / Self Care | Payer: Medicaid Other | Attending: Emergency Medicine | Admitting: Emergency Medicine

## 2021-04-12 ENCOUNTER — Emergency Department (HOSPITAL_COMMUNITY): Payer: Medicaid Other

## 2021-04-12 ENCOUNTER — Encounter (HOSPITAL_COMMUNITY): Payer: Self-pay | Admitting: Emergency Medicine

## 2021-04-12 DIAGNOSIS — F17210 Nicotine dependence, cigarettes, uncomplicated: Secondary | ICD-10-CM | POA: Insufficient documentation

## 2021-04-12 DIAGNOSIS — I10 Essential (primary) hypertension: Secondary | ICD-10-CM | POA: Insufficient documentation

## 2021-04-12 DIAGNOSIS — M79671 Pain in right foot: Secondary | ICD-10-CM

## 2021-04-12 DIAGNOSIS — Z79899 Other long term (current) drug therapy: Secondary | ICD-10-CM | POA: Insufficient documentation

## 2021-04-12 DIAGNOSIS — G8929 Other chronic pain: Secondary | ICD-10-CM | POA: Insufficient documentation

## 2021-04-12 NOTE — ED Triage Notes (Signed)
Patient c/o swelling and pain to right foot worsening x3 weeks. Denies injury. States pain worsens with ambulation.

## 2021-04-12 NOTE — Discharge Instructions (Addendum)
Like we discussed, please continue to apply ice to the foot and elevated in the evening.  I would recommend Tylenol and ibuprofen as needed.  Please follow the instructions on the bottle.  I would also recommend trying to stretch the foot with a lacrosse ball or racquetball.  You can do this on the floor.  You can also try freezing a can of vegetables or beans and using the cold can to roll out the tissue on the bottom of the foot in the evening.  Please wear your cam boot as needed for help with your pain.  Below is the contact information for a local podiatrist.  Please give them a call and schedule a follow-up appointment.  If you develop any new or worsening symptoms please come back to the emergency department.  It was a pleasure to meet you.

## 2021-04-12 NOTE — ED Provider Notes (Signed)
[place] DEPT Provider Note   CSN: [id number] Arrival date & time: 04/12/21  1525     History Chief Complaint  Patient presents with   Foot Pain    [name] [name] is a 50 y.o. female.  HPI Patient is a 50 year old female who presents to the emergency department with chronic right foot pain.  Patient states her symptoms present and worsened with ambulation.  States have been ongoing for many weeks but began recently worsening.  States it is along the right lateral foot.  Denies any swelling or increased warmth in the foot.  No fevers, nausea, vomiting.    Past Medical History:  Diagnosis Date   Anxiety    history - no meds   Arthritis    bulging disc L4/5  L5/S1   Chronic back pain    Cystitis 07/2008   under care of [facility]   Depression    history - no meds   GERD (gastroesophageal reflux disease)    H/O hiatal hernia    no meds - diet controlled   Hepatitis    History Hep A at age 43 yrs - no prev problems   Hyperlipidemia    Hypertension    Insomnia    Macular degeneration 2013   Age related per pt - no meds   Pain, dental 2010   history - teeth ext per patient   Plantar fasciitis    s/p left gastroc slide    S/P dilatation of esophageal stricture 2006-2008    Patient Active Problem List   Diagnosis Date Noted   Swelling of lower leg 03/15/2018   Cervical high risk HPV (human papillomavirus) test positive 12/28/2017   Abnormal vaginal bleeding 12/21/2017   Encntr for gyn exam (general) (routine) w abnormal findings 09/24/2016   Insomnia 09/09/2014   Bereavement 09/09/2014   Missed abortion    Hematuria, undiagnosed cause 11/26/2012   Previous cesarean delivery, antepartum condition or complication [id number]   Recurrent UTI 04/12/2011   LEG PAIN, BILATERAL 10/16/2009   Chronic interstitial cystitis 05/13/2009   BACK PAIN 05/13/2009   ANXIETY 10/12/2006   TOBACCO ABUSE 10/12/2006   DEPRESSION 10/12/2006    ESOPHAGEAL STRICTURE 10/12/2006   GERD 10/12/2006    Past Surgical History:  Procedure Laterality Date   CESAREAN SECTION     [place]   COLONOSCOPY     dilatation of esophageal stricture  2005-2008   DILATION AND EVACUATION  02/09/2012   Procedure: DILATATION AND EVACUATION;  Surgeon: Osborne Oman, MD;  Location: [facility];  Service: Gynecology;  Laterality: N/A;  With ultrasound guidance   DILATION AND EVACUATION N/A 08/29/2014   Procedure: DILATATION AND EVACUATION (D&E) 2ND TRIMESTER;  Surgeon: Mora Bellman, MD;  Location: [place];  Service: Gynecology;  Laterality: N/A;   ESOPHAGOGASTRODUODENOSCOPY N/A 09/30/2017   Procedure: ESOPHAGOGASTRODUODENOSCOPY (EGD);  Surgeon: Wonda Horner, MD;  Location: [facility];  Service: Endoscopy;  Laterality: N/A;   gastroc slide  2005   left side    Pancystourethroscopy     plantar fasciitis     left foot   SVD  1999   x 1     OB History     Gravida  4   Para  2   Term  2   Preterm      AB  1   Living  2      SAB  1   IAB  Ectopic      Multiple      Live Births  2           Family History  Problem Relation Age of Onset   Diabetes Mother    Hyperlipidemia Mother    Stroke Mother    Hypertension Mother    Arthritis Mother    Heart disease Father    Hypertension Father    Hypertension Brother     Social History   Tobacco Use   Smoking status: Every Day    Packs/day: 0.25    Years: 8.00    Pack years: 2.00    Types: Cigarettes   Smokeless tobacco: Never   Tobacco comments:    4-5 cigs/day  Substance Use Topics   Alcohol use: No    Comment: rarely   Drug use: No    Home Medications Prior to Admission medications   Medication Sig Start Date End Date Taking? Authorizing Provider  amitriptyline (ELAVIL) 50 MG tablet Take 50 mg by mouth at bedtime.    [facility]  benzonatate (TESSALON) 100 MG capsule Take 100 mg by mouth.    [provider]  cefdinir  (OMNICEF) 300 MG capsule Take 300 mg by mouth.    [provider]  clindamycin (CLEOCIN) 300 MG capsule Take 300 mg by mouth.    [provider]  HYDROcodone-acetaminophen (NORCO/VICODIN) 5-325 MG tablet Take 1 tablet by mouth every 6 (six) hours as needed for severe pain. 07/26/20   Walisiewicz, Harley Hallmark, PA-C  lisinopril-hydrochlorothiazide (ZESTORETIC) 20-12.5 MG tablet Take 1 tablet by mouth daily.    [provider]  olmesartan-hydrochlorothiazide (BENICAR HCT) 20-12.5 MG tablet TK 1 T PO QD 07/26/18   [provider]  ondansetron (ZOFRAN-ODT) 8 MG disintegrating tablet Take 8 mg by mouth.    [provider]  pantoprazole (PROTONIX) 40 MG tablet Take 1 tablet (40 mg total) by mouth 2 (two) times daily. 09/30/17 10/30/17  Wonda Horner, MD    Allergies    Penicillins and Azithromycin  Review of Systems   Review of Systems  Constitutional:  Negative for fever.  Gastrointestinal:  Negative for vomiting.  Musculoskeletal:  Positive for arthralgias and myalgias.  Skin:  Negative for color change and wound.  Neurological:  Negative for weakness and numbness.   Physical Exam Updated Vital Signs BP 124/76   Pulse (!) 112   Temp 98.7 F (37.1 C) (Oral)   Resp 18   LMP 07/27/2019 (Approximate)   SpO2 95%   Physical Exam Vitals and nursing note reviewed.  Constitutional:      General: She is not in acute distress.    Appearance: She is well-developed.  HENT:     Head: Normocephalic and atraumatic.     Right Ear: External ear normal.     Left Ear: External ear normal.  Eyes:     General: No scleral icterus.       Right eye: No discharge.        Left eye: No discharge.     Conjunctiva/sclera: Conjunctivae normal.  Neck:     Trachea: No tracheal deviation.  Cardiovascular:     Rate and Rhythm: Normal rate.  Pulmonary:     Effort: Pulmonary effort is normal. No respiratory distress.     Breath sounds: No stridor.  Abdominal:      General: There is no distension.  Musculoskeletal:        General: Tenderness present. No swelling or deformity.  Cervical back: Neck supple.     Comments: Right foot: Mild tenderness appreciated along the fifth metatarsal.  No overlying skin changes.  No swelling.  No erythema.  Able to wiggle toes without difficulty.  Distal sensation intact.  2+ pedal pulses.  Skin:    General: Skin is warm and dry.     Findings: No rash.  Neurological:     General: No focal deficit present.     Mental Status: She is alert and oriented to person, place, and time.     Cranial Nerves: Cranial nerve deficit: no gross deficits.    ED Results / Procedures / Treatments   Labs (all labs ordered are listed, but only abnormal results are displayed) Labs Reviewed - No data to display  EKG None  Radiology DG Foot Complete Right  Result Date: 04/12/2021 CLINICAL DATA:  Increasing lateral right foot pain for 3 weeks. No known injury. EXAM: RIGHT FOOT COMPLETE - 3+ VIEW COMPARISON:  None. FINDINGS: There is no evidence of fracture or dislocation. There is no evidence of arthropathy or other focal bone abnormality. Soft tissues are unremarkable. IMPRESSION: Normal exam. Electronically Signed   By: Inge Rise M.D.   On: 04/12/2021 16:41    Procedures Procedures   Medications Ordered in ED Medications - No data to display  ED Course  I have reviewed the triage vital signs and the nursing notes.  Pertinent labs & imaging results that were available during my care of the patient were reviewed by me and considered in my medical decision making (see chart for details).    MDM Rules/Calculators/A&P                          Patient is a 50 year old female who presents to the emergency department with right lateral foot pain that began a few weeks ago.  Recently began worsening.  X-rays were obtained which were negative.  After further discussion patient notes that she has a history of plantar  fasciitis in the left foot that is required surgery in the past.  Feel that her right foot is an overuse injury.  No increased warmth in the foot or overlying skin changes.  Patient is afebrile.  Doubt developing infection.  Discussed continued pain management with Tylenol and ibuprofen.  We will place patient in a cam boot for comfort.  Discussed the RICE method.  Patient given a referral to podiatry.  Discussed return precautions.  Feel that she is stable for discharge at this time and she is agreeable.  Her questions were answered and she was amicable at the time of discharge.  Final Clinical Impression(s) / ED Diagnoses Final diagnoses:  Foot pain, right    Rx / DC Orders ED Discharge Orders     None        [name], PA-C 04/12/21 1835    [name], MD 04/12/21 2035

## 2021-04-12 NOTE — ED Provider Notes (Signed)
Emergency Medicine Provider Triage Evaluation Note  [name] [name] , a 50 y.o. female  was evaluated in triage.  Pt complains of right foot pain.  Patient states that her symptoms have been ongoing for many weeks.  They have been progressively worsening and states that her pain presents and worsens with ambulation.  States that it is along the right lateral foot.  No overlying skin changes or increased warmth.  Denies fevers, nausea, vomiting.  Physical Exam  BP 124/76   Pulse (!) 116   Temp 98.7 F (37.1 C) (Oral)   Resp 18   SpO2 95%  Gen:   Awake, no distress   Resp:  Normal effort  MSK:   Moves extremities without difficulty  Other:    Medical Decision Making  Medically screening exam initiated at 4:08 PM.  Appropriate orders placed.  [name] was informed that the remainder of the evaluation will be completed by another provider, this initial triage assessment does not replace that evaluation, and the importance of remaining in the ED until their evaluation is complete.     [name], PA-C 04/12/21 1609    [name], MD 04/12/21 1622

## 2021-08-10 ENCOUNTER — Encounter (HOSPITAL_COMMUNITY): Payer: Self-pay | Admitting: Emergency Medicine

## 2021-08-10 ENCOUNTER — Emergency Department (HOSPITAL_COMMUNITY)
Admission: EM | Admit: 2021-08-10 | Discharge: 2021-08-10 | Disposition: A | Discharge status: Home / Self Care | Payer: Medicaid Other | Attending: Emergency Medicine | Admitting: Emergency Medicine

## 2021-08-10 ENCOUNTER — Other Ambulatory Visit: Payer: Self-pay

## 2021-08-10 DIAGNOSIS — R030 Elevated blood-pressure reading, without diagnosis of hypertension: Secondary | ICD-10-CM | POA: Insufficient documentation

## 2021-08-10 DIAGNOSIS — F17210 Nicotine dependence, cigarettes, uncomplicated: Secondary | ICD-10-CM | POA: Insufficient documentation

## 2021-08-10 DIAGNOSIS — I10 Essential (primary) hypertension: Secondary | ICD-10-CM | POA: Insufficient documentation

## 2021-08-10 DIAGNOSIS — R519 Headache, unspecified: Secondary | ICD-10-CM | POA: Insufficient documentation

## 2021-08-10 DIAGNOSIS — Z760 Encounter for issue of repeat prescription: Secondary | ICD-10-CM | POA: Insufficient documentation

## 2021-08-10 DIAGNOSIS — Z79899 Other long term (current) drug therapy: Secondary | ICD-10-CM | POA: Insufficient documentation

## 2021-08-10 MED ORDER — OLMESARTAN MEDOXOMIL-HCTZ 20-12.5 MG PO TABS: 1.0000 | ORAL_TABLET | Freq: Every day | ORAL | 0 refills | Status: DC

## 2021-08-10 NOTE — Discharge Instructions (Addendum)
Your blood pressure improved to 132/86 while you are in the emergency room.  I do not believe you are having issues from the elevated blood pressure such as with your brain, heart.  I have sent in a refill to your pharmacy on your blood pressure medication.  It is important that you set up a primary care provider to have additional refills sent in.  If you develop chest pain, change in vision, weakness, shortness of breath please return to the emergency room.  I have attached a resource sheet to assist you with setting up a primary care provider.  You also have a phone number on your discharge paperwork that you can reach out to you to assist in setting up a primary care provider.

## 2021-08-10 NOTE — ED Triage Notes (Signed)
PT requesting olmesartan-HCTZ 20-12.5mg  refill. Has not taken since loosing insurance in August.

## 2021-08-10 NOTE — ED Provider Notes (Signed)
[facility] DEPT Provider Note   CSN: [id number] Arrival date & time: 08/10/21  1228     History Chief Complaint  Patient presents with   Medication Refill    [name] [name] is a 50 y.o. female.  50 year old female presents today for evaluation of elevated blood pressure.  Patient reports she ran out of her insurance a few months ago and establish care with a primary care who she was unable to pay the bill for work given the cost and was dismissed from the practice.  She did receive 1 month refill on her blood pressure medication during that visit which she has taken about every other day to have a last until this past Friday.  She reports she has noticed headache and elevated blood pressure reading at a pharmacy yesterday and decided to present to the emergency room for evaluation.  She denies any visual changes, chest pain, shortness of breath, issues with balance, or weakness.  She is without other complaints.  During that visit she was also switched to losartan from lisinopril secondary to cough.  The history is provided by the patient. No language interpreter was used.      Past Medical History:  Diagnosis Date   Anxiety    history - no meds   Arthritis    bulging disc L4/5  L5/S1   Chronic back pain    Cystitis 07/2008   under care of [facility]   Depression    history - no meds   GERD (gastroesophageal reflux disease)    H/O hiatal hernia    no meds - diet controlled   Hepatitis    History Hep A at age 30 yrs - no prev problems   Hyperlipidemia    Hypertension    Insomnia    Macular degeneration 2013   Age related per pt - no meds   Pain, dental 2010   history - teeth ext per patient   Plantar fasciitis    s/p left gastroc slide    S/P dilatation of esophageal stricture 2006-2008    Patient Active Problem List   Diagnosis Date Noted   Swelling of lower leg 03/15/2018   Cervical high risk HPV (human papillomavirus) test  positive 12/28/2017   Abnormal vaginal bleeding 12/21/2017   Encntr for gyn exam (general) (routine) w abnormal findings 09/24/2016   Insomnia 09/09/2014   Bereavement 09/09/2014   Missed abortion    Hematuria, undiagnosed cause 11/26/2012   Previous cesarean delivery, antepartum condition or complication 33/35/4562   Recurrent UTI 04/12/2011   LEG PAIN, BILATERAL 10/16/2009   Chronic interstitial cystitis 05/13/2009   BACK PAIN 05/13/2009   ANXIETY 10/12/2006   TOBACCO ABUSE 10/12/2006   DEPRESSION 10/12/2006   ESOPHAGEAL STRICTURE 10/12/2006   GERD 10/12/2006    Past Surgical History:  Procedure Laterality Date   CESAREAN SECTION     [place]   COLONOSCOPY     dilatation of esophageal stricture  2005-2008   DILATION AND EVACUATION  02/09/2012   Procedure: DILATATION AND EVACUATION;  Surgeon: Osborne Oman, MD;  Location: [facility];  Service: Gynecology;  Laterality: N/A;  With ultrasound guidance   DILATION AND EVACUATION N/A 08/29/2014   Procedure: DILATATION AND EVACUATION (D&E) 2ND TRIMESTER;  Surgeon: Mora Bellman, MD;  Location: [facility];  Service: Gynecology;  Laterality: N/A;   ESOPHAGOGASTRODUODENOSCOPY N/A 09/30/2017   Procedure: ESOPHAGOGASTRODUODENOSCOPY (EGD);  Surgeon: Wonda Horner, MD;  Location: [facility];  Service: Endoscopy;  Laterality: N/A;   gastroc slide  2005   left side    Pancystourethroscopy     plantar fasciitis     left foot   SVD  1999   x 1     OB History     Gravida  4   Para  2   Term  2   Preterm      AB  1   Living  2      SAB  1   IAB      Ectopic      Multiple      Live Births  2           Family History  Problem Relation Age of Onset   Diabetes Mother    Hyperlipidemia Mother    Stroke Mother    Hypertension Mother    Arthritis Mother    Heart disease Father    Hypertension Father    Hypertension Brother     Social History   Tobacco Use   Smoking status: Every Day     Packs/day: 0.25    Years: 8.00    Pack years: 2.00    Types: Cigarettes   Smokeless tobacco: Never   Tobacco comments:    4-5 cigs/day  Substance Use Topics   Alcohol use: No    Comment: rarely   Drug use: No    Home Medications Prior to Admission medications   Medication Sig Start Date End Date Taking? Authorizing Provider  olmesartan-hydrochlorothiazide (BENICAR HCT) 20-12.5 MG tablet Take 1 tablet by mouth daily. 08/10/21  Yes Haydyn Girvan, PA-C  amitriptyline (ELAVIL) 50 MG tablet Take 50 mg by mouth at bedtime.    [facility]  benzonatate (TESSALON) 100 MG capsule Take 100 mg by mouth.    [provider]  cefdinir (OMNICEF) 300 MG capsule Take 300 mg by mouth.    [provider]  clindamycin (CLEOCIN) 300 MG capsule Take 300 mg by mouth.    [provider]  HYDROcodone-acetaminophen (NORCO/VICODIN) 5-325 MG tablet Take 1 tablet by mouth every 6 (six) hours as needed for severe pain. 07/26/20   Walisiewicz, Verline Lema E, PA-C  ondansetron (ZOFRAN-ODT) 8 MG disintegrating tablet Take 8 mg by mouth.    [provider]  pantoprazole (PROTONIX) 40 MG tablet Take 1 tablet (40 mg total) by mouth 2 (two) times daily. 09/30/17 10/30/17  Wonda Horner, MD    Allergies    Penicillins and Azithromycin  Review of Systems   Review of Systems  Respiratory:  Negative for chest tightness and shortness of breath.   Cardiovascular:  Negative for chest pain and palpitations.  Gastrointestinal:  Negative for nausea and vomiting.  Neurological:  Positive for headaches. Negative for syncope, facial asymmetry, weakness and light-headedness.  All other systems reviewed and are negative.  Physical Exam Updated Vital Signs BP 132/81   Pulse 91   Temp 99 F (37.2 C) (Oral)   Resp 16   LMP 07/27/2019 (Approximate)   SpO2 98%   Physical Exam Vitals and nursing note reviewed.  Constitutional:      General: She is not in acute distress.    Appearance: Normal  appearance. She is not ill-appearing.  HENT:     Head: Normocephalic and atraumatic.     Nose: Nose normal.  Eyes:     General: No scleral icterus.    Extraocular Movements: Extraocular movements intact.     Conjunctiva/sclera: Conjunctivae normal.  Cardiovascular:  Rate and Rhythm: Normal rate and regular rhythm.     Pulses: Normal pulses.     Heart sounds: Normal heart sounds.  Pulmonary:     Effort: Pulmonary effort is normal. No respiratory distress.     Breath sounds: Normal breath sounds. No wheezing or rales.  Abdominal:     General: There is no distension.     Tenderness: There is no abdominal tenderness.  Musculoskeletal:        General: Normal range of motion.     Cervical back: Normal range of motion.  Skin:    General: Skin is warm and dry.  Neurological:     General: No focal deficit present.     Mental Status: She is alert. Mental status is at baseline.    ED Results / Procedures / Treatments   Labs (all labs ordered are listed, but only abnormal results are displayed) Labs Reviewed - No data to display  EKG None  Radiology No results found.  Procedures Procedures   Medications Ordered in ED Medications - No data to display  ED Course  I have reviewed the triage vital signs and the nursing notes.  Pertinent labs & imaging results that were available during my care of the patient were reviewed by me and considered in my medical decision making (see chart for details).    MDM Rules/Calculators/A&P                           50 year old female presents today for evaluation of elevated blood pressure reading.  Patient blood pressure improved to 132/81 with a heart rate of 86 during my interview.  Patient without headache, or any red flag signs for hypertensive emergency.  We will give patient refill for her blood pressure medication of 1 month and provided patient with resources to establish primary care provider.  Return precautions to the emergency  room discussed.  Patient voices understanding and is in agreement with plan.  Final Clinical Impression(s) / ED Diagnoses Final diagnoses:  Elevated blood pressure reading    Rx / DC Orders ED Discharge Orders          Ordered    olmesartan-hydrochlorothiazide (BENICAR HCT) 20-12.5 MG tablet  Daily        08/10/21 1558             Evlyn Courier, PA-C 08/10/21 1636    Pattricia Boss, MD 08/12/21 1021

## 2022-02-24 ENCOUNTER — Encounter: Payer: Self-pay | Admitting: Physician Assistant

## 2022-02-24 ENCOUNTER — Ambulatory Visit: Payer: Self-pay | Admitting: Physician Assistant

## 2022-02-24 VITALS — BP 168/100 | HR 99 | Resp 18 | Ht 60.0 in | Wt 187.0 lb

## 2022-02-24 DIAGNOSIS — I160 Hypertensive urgency: Secondary | ICD-10-CM

## 2022-02-24 DIAGNOSIS — Z1159 Encounter for screening for other viral diseases: Secondary | ICD-10-CM

## 2022-02-24 DIAGNOSIS — M79604 Pain in right leg: Secondary | ICD-10-CM

## 2022-02-24 DIAGNOSIS — E6609 Other obesity due to excess calories: Secondary | ICD-10-CM

## 2022-02-24 DIAGNOSIS — K222 Esophageal obstruction: Secondary | ICD-10-CM

## 2022-02-24 DIAGNOSIS — Z6836 Body mass index (BMI) 36.0-36.9, adult: Secondary | ICD-10-CM

## 2022-02-24 DIAGNOSIS — E559 Vitamin D deficiency, unspecified: Secondary | ICD-10-CM

## 2022-02-24 DIAGNOSIS — F332 Major depressive disorder, recurrent severe without psychotic features: Secondary | ICD-10-CM

## 2022-02-24 DIAGNOSIS — F5104 Psychophysiologic insomnia: Secondary | ICD-10-CM

## 2022-02-24 DIAGNOSIS — E66812 Obesity, class 2: Secondary | ICD-10-CM

## 2022-02-24 DIAGNOSIS — E7801 Familial hypercholesterolemia: Secondary | ICD-10-CM

## 2022-02-24 DIAGNOSIS — F411 Generalized anxiety disorder: Secondary | ICD-10-CM

## 2022-02-24 DIAGNOSIS — I10 Essential (primary) hypertension: Secondary | ICD-10-CM

## 2022-02-24 DIAGNOSIS — M79605 Pain in left leg: Secondary | ICD-10-CM

## 2022-02-24 MED ORDER — OLMESARTAN MEDOXOMIL-HCTZ 20-12.5 MG PO TABS
1.0000 | ORAL_TABLET | Freq: Every day | ORAL | 1 refills | Status: DC
  Filled 2022-02-24 – 2022-02-25 (×2): qty 30, 30d supply, fill #0
  Filled 2022-03-26 (×2): qty 30, 30d supply, fill #1

## 2022-02-24 MED ORDER — GABAPENTIN 300 MG PO CAPS
300.0000 mg | ORAL_CAPSULE | Freq: Every day | ORAL | 2 refills | Status: DC
  Filled 2022-02-24: qty 30, 30d supply, fill #0
  Filled 2022-03-26: qty 30, 30d supply, fill #1

## 2022-02-24 MED ORDER — AMITRIPTYLINE HCL 50 MG PO TABS
50.0000 mg | ORAL_TABLET | Freq: Every day | ORAL | 1 refills | Status: DC
  Filled 2022-02-24: qty 30, 30d supply, fill #0
  Filled 2022-03-26: qty 30, 30d supply, fill #1

## 2022-02-24 MED ORDER — PANTOPRAZOLE SODIUM 40 MG PO TBEC
40.0000 mg | DELAYED_RELEASE_TABLET | Freq: Every day | ORAL | 1 refills | Status: DC
  Filled 2022-02-24: qty 30, 30d supply, fill #0
  Filled 2022-03-26: qty 30, 30d supply, fill #1

## 2022-02-24 MED ORDER — SERTRALINE HCL 50 MG PO TABS
50.0000 mg | ORAL_TABLET | Freq: Every day | ORAL | 3 refills | Status: DC
  Filled 2022-02-24: qty 30, 30d supply, fill #0
  Filled 2022-03-26: qty 30, 30d supply, fill #1

## 2022-02-24 MED ORDER — HYDROXYZINE HCL 25 MG PO TABS
25.0000 mg | ORAL_TABLET | Freq: Three times a day (TID) | ORAL | 1 refills | Status: DC | PRN
  Filled 2022-02-24: qty 60, 20d supply, fill #0
  Filled 2022-03-26: qty 60, 20d supply, fill #1

## 2022-02-24 NOTE — Patient Instructions (Addendum)
You are going to restart your blood pressure medication tomorrow morning.  I encourage you to check your blood pressure at home on a daily basis, keep a written log and have it available for when you have your appointment to establish care with Dr. Joya Gaskins on April 05, 2022.  To help with your depression and anxiety, you are going to start taking Zoloft on a daily basis.  It may take 3 to 4 weeks before you notice any difference with this medication.  To help with your anxiety during the day you can use 1/2-1 full tablet of hydroxyzine every 8 hours.  To help with your sleep, you can take 1 full tablet to 1-1/2 tablets of hydroxyzine 30 to 45 minutes before bedtime.  I encourage you to aim for 7 hours of sleep at night.  You will have fasting labs completed tomorrow morning, we will call you with your lab results when they are available.  Please let us know if there is anything else we can do for you  Kennieth Rad, PA-C Physician Assistant [place] [email or web address]   How to Take Your Blood Pressure Blood pressure is a measurement of how strongly your blood is pressing against the walls of your arteries. Arteries are blood vessels that carry blood from your heart throughout your body. Your health care provider takes your blood pressure at each office visit. You can also take your own blood pressure at home with a blood pressure monitor. You may need to take your own blood pressure to: Confirm a diagnosis of high blood pressure (hypertension). Monitor your blood pressure over time. Make sure your blood pressure medicine is working. Supplies needed: Blood pressure monitor. A chair to sit in. This should be a chair where you can sit upright with your back supported. Do not sit on a soft couch or an armchair. Table or desk. Small notebook and pencil or pen. How to prepare To get the most accurate reading, avoid the following for 30  minutes before you check your blood pressure: Drinking caffeine. Drinking alcohol. Eating. Smoking. Exercising. Five minutes before you check your blood pressure: Use the bathroom and urinate so that you have an empty bladder. Sit quietly in a chair. Do not talk. How to take your blood pressure To check your blood pressure, follow the instructions in the manual that came with your blood pressure monitor. If you have a digital blood pressure monitor, the instructions may be as follows: Sit up straight in a chair. Place your feet on the floor. Do not cross your ankles or legs. Rest your left arm at the level of your heart on a table or desk or on the arm of a chair. Pull up your shirt sleeve. Wrap the blood pressure cuff around the upper part of your left arm, 1 inch (2.5 cm) above your elbow. It is best to wrap the cuff around bare skin. Fit the cuff snugly, but not too tightly, around your arm. You should be able to place only one finger between the cuff and your arm. Position the cord so that it rests in the bend of your elbow. Press the power button. Sit quietly while the cuff inflates and deflates. Read the digital reading on the monitor screen and write the numbers down (record them) in a notebook. Wait 2-3 minutes, then repeat the steps, starting at step 1. What does my blood pressure reading mean? A blood pressure reading consists of a higher number over a lower  number. Ideally, your blood pressure should be below 120/80. The first ("top") number is called the systolic pressure. It is a measure of the pressure in your arteries as your heart beats. The second ("bottom") number is called the diastolic pressure. It is a measure of the pressure in your arteries as the heart relaxes. Blood pressure is classified into four stages. The following are the stages for adults who do not have a short-term serious illness or a chronic condition. Systolic pressure and diastolic pressure are measured  in a unit called mm Hg (millimeters of mercury).  Normal Systolic pressure: below 644. Diastolic pressure: below 80. Elevated Systolic pressure: 034-742. Diastolic pressure: below 80. Hypertension stage 1 Systolic pressure: 595-638. Diastolic pressure: 75-64. Hypertension stage 2 Systolic pressure: 332 or above. Diastolic pressure: 90 or above. You can have elevated blood pressure or hypertension even if only the systolic or only the diastolic number in your reading is higher than normal. Follow these instructions at home: Medicines Take over-the-counter and prescription medicines only as told by your health care provider. Tell your health care provider if you are having any side effects from blood pressure medicine. General instructions Check your blood pressure as often as recommended by your health care provider. Check your blood pressure at the same time every day. Take your monitor to the next appointment with your health care provider to make sure that: You are using it correctly. It provides accurate readings. Understand what your goal blood pressure numbers are. Keep all follow-up visits. This is important. General tips Your health care provider can suggest a reliable monitor that will meet your needs. There are several types of home blood pressure monitors. Choose a monitor that has an arm cuff. Do not choose a monitor that measures your blood pressure from your wrist or finger. Choose a cuff that wraps snugly, not too tight or too loose, around your upper arm. You should be able to fit only one finger between your arm and the cuff. You can buy a blood pressure monitor at most drugstores or online. Where to find more information American Heart Association: www.heart.org Contact a health care provider if: Your blood pressure is consistently high. Your blood pressure is suddenly low. Get help right away if: Your systolic blood pressure is higher than 180. Your diastolic  blood pressure is higher than 120. These symptoms may be an emergency. Get help right away. Call 911. Do not wait to see if the symptoms will go away. Do not drive yourself to the hospital. Summary Blood pressure is a measurement of how strongly your blood is pressing against the walls of your arteries. A blood pressure reading consists of a higher number over a lower number. Ideally, your blood pressure should be below 120/80. Check your blood pressure at the same time every day. Avoid caffeine, alcohol, smoking, and exercise for 30 minutes prior to checking your blood pressure. These agents can affect the accuracy of the blood pressure reading. This information is not intended to replace advice given to you by your health care provider. Make sure you discuss any questions you have with your health care provider. Document Revised: 05/14/2021 Document Reviewed: 05/14/2021 Elsevier Patient Education  [place], Adult After being diagnosed with anxiety, you may be relieved to know why you have felt or behaved a certain way. You may also feel overwhelmed about the treatment ahead and what it will mean for your life. With care and support, you can manage this  condition. How to manage lifestyle changes Managing stress and anxiety  Stress is your body's reaction to life changes and events, both good and bad. Most stress will last just a few hours, but stress can be ongoing and can lead to more than just stress. Although stress can play a major role in anxiety, it is not the same as anxiety. Stress is usually caused by something external, such as a deadline, test, or competition. Stress normally passes after the triggering event has ended.  Anxiety is caused by something internal, such as imagining a terrible outcome or worrying that something will go wrong that will devastate you. Anxiety often does not go away even after the triggering event is over, and it can become  long-term (chronic) worry. It is important to understand the differences between stress and anxiety and to manage your stress effectively so that it does not lead to an anxious response. Talk with your health care provider or a counselor to learn more about reducing anxiety and stress. He or she may suggest tension reduction techniques, such as: Music therapy. Spend time creating or listening to music that you enjoy and that inspires you. Mindfulness-based meditation. Practice being aware of your normal breaths while not trying to control your breathing. It can be done while sitting or walking. Centering prayer. This involves focusing on a word, phrase, or sacred image that means something to you and brings you peace. Deep breathing. To do this, expand your stomach and inhale slowly through your nose. Hold your breath for 3-5 seconds. Then exhale slowly, letting your stomach muscles relax. Self-talk. Learn to notice and identify thought patterns that lead to anxiety reactions and change those patterns to thoughts that feel peaceful. Muscle relaxation. Taking time to tense muscles and then relax them. Choose a tension reduction technique that fits your lifestyle and personality. These techniques take time and practice. Set aside 5-15 minutes a day to do them. Therapists can offer counseling and training in these techniques. The training to help with anxiety may be covered by some insurance plans. Other things you can do to manage stress and anxiety include: Keeping a stress diary. This can help you learn what triggers your reaction and then learn ways to manage your response. Thinking about how you react to certain situations. You may not be able to control everything, but you can control your response. Making time for activities that help you relax and not feeling guilty about spending your time in this way. Doing visual imagery. This involves imagining or creating mental pictures to help you  relax. Practicing yoga. Through yoga poses, you can lower tension and promote relaxation.  Medicines Medicines can help ease symptoms. Medicines for anxiety include: Antidepressant medicines. These are usually prescribed for long-term daily control. Anti-anxiety medicines. These may be added in severe cases, especially when panic attacks occur. Medicines will be prescribed by a health care provider. When used together, medicines, psychotherapy, and tension reduction techniques may be the most effective treatment. Relationships Relationships can play a big part in helping you recover. Try to spend more time connecting with trusted friends and family members. Consider going to couples counseling if you have a partner, taking family education classes, or going to family therapy. Therapy can help you and others better understand your condition. How to recognize changes in your anxiety Everyone responds differently to treatment for anxiety. Recovery from anxiety happens when symptoms decrease and stop interfering with your daily activities at home or work. This may mean  that you will start to: Have better concentration and focus. Worry will interfere less in your daily thinking. Sleep better. Be less irritable. Have more energy. Have improved memory. It is also important to recognize when your condition is getting worse. Contact your health care provider if your symptoms interfere with home or work and you feel like your condition is not improving. Follow these instructions at home: Activity Exercise. Adults should do the following: Exercise for at least 150 minutes each week. The exercise should increase your heart rate and make you sweat (moderate-intensity exercise). Strengthening exercises at least twice a week. Get the right amount and quality of sleep. Most adults need 7-9 hours of sleep each night. Lifestyle  Eat a healthy diet that includes plenty of vegetables, fruits, whole grains,  low-fat dairy products, and lean protein. Do not eat a lot of foods that are high in fats, added sugars, or salt (sodium). Make choices that simplify your life. Do not use any products that contain nicotine or tobacco. These products include cigarettes, chewing tobacco, and vaping devices, such as e-cigarettes. If you need help quitting, ask your health care provider. Avoid caffeine, alcohol, and certain over-the-counter cold medicines. These may make you feel worse. Ask your pharmacist which medicines to avoid. General instructions Take over-the-counter and prescription medicines only as told by your health care provider. Keep all follow-up visits. This is important. Where to find support You can get help and support from these sources: Self-help groups. Online and [employer]. A trusted spiritual leader. Couples counseling. Family education classes. Family therapy. Where to find more information You may find that joining a support group helps you deal with your anxiety. The following sources can help you locate counselors or support groups near you: [place]: www.mentalhealthamerica.net Anxiety and Depression Association of [place] (ADAA): https://www.clark.net/ National Alliance on Mental Illness (NAMI): www.nami.org Contact a health care provider if: You have a hard time staying focused or finishing daily tasks. You spend many hours a day feeling worried about everyday life. You become exhausted by worry. You start to have headaches or frequently feel tense. You develop chronic nausea or diarrhea. Get help right away if: You have a racing heart and shortness of breath. You have thoughts of hurting yourself or others. If you ever feel like you may hurt yourself or others, or have thoughts about taking your own life, get help right away. Go to your nearest emergency department or: Call your local emergency services (911 in the [place] Call a suicide crisis helpline,  such as the [facility] at 228-878-9558 or 988 in the [place]. This is open 24 hours a day in the [place] Text the Crisis Text Line at 641-315-2466 (in the [place].). Summary Taking steps to learn and use tension reduction techniques can help calm you and help prevent triggering an anxiety reaction. When used together, medicines, psychotherapy, and tension reduction techniques may be the most effective treatment. Family, friends, and partners can play a big part in supporting you. This information is not intended to replace advice given to you by your health care provider. Make sure you discuss any questions you have with your health care provider. Document Revised: 03/25/2021 Document Reviewed: 12/21/2020 Elsevier Patient Education  [place].

## 2022-02-24 NOTE — Progress Notes (Signed)
New Patient Office Visit  Subjective    Patient ID: [name] [name], female    DOB: 1971/01/17  Age: 51 y.o. MRN: [id number]  CC:  Chief Complaint  Patient presents with   Hypertension    HPI [name] [name] presents for medication refills.  States that she has been taking blood pressure medication for the past 5 to 6 years.  States that she has been out of her medication for the past few months.  States that she does have a blood pressure cuff at home, and will start checking her blood pressure on a daily basis.  States that she has not been checking it previously, just received a blood pressure cuff.  States that she has been having intermittent sharp headaches since being out of her blood pressure medication.  States that she has been going to orthopedics for bulging disc which is caused her to have bilateral leg pain.  States that she takes amitriptyline and gabapentin as well as Norco.  States that she has not been able to follow-up with them due to financial constraints.  States that she has been having elevated anxiety, and a depressed mood.  States that she always has the feeling of "doom and gloom."  States that she is having difficulty falling asleep and staying asleep, has failed melatonin.  States that she was previously prescribed medication to help her with her depression, states she believes it was Lexapro, states that she did not take it for longer than 30 days, states that she did not notice any difference.  [name].       02/25/2022    9:24 AM 03/15/2018    9:29 AM 09/22/2016    2:04 PM 07/09/2016    9:20 AM 09/09/2014    3:26 PM  Depression screen PHQ 2/9  Decreased Interest 3 3 0 1 0  Down, Depressed, Hopeless 3 3 0 1 1  PHQ - 2 Score 6 6 0 2 1  Altered sleeping 3 3  0   Tired, decreased energy '3 3  1   '$ Change in appetite 0 3  0   Feeling bad or failure about yourself  0 0  0   Trouble concentrating 1 2  0   Moving slowly or  fidgety/restless 0 1  0   Suicidal thoughts 0 0  0   PHQ-9 Score '13 18  3   '$ Difficult doing work/chores Extremely dIfficult Somewhat difficult  Not difficult at all       02/25/2022    9:24 AM  GAD 7 : Generalized Anxiety Score  Nervous, Anxious, on Edge 3  Control/stop worrying 3  Worry too much - different things 3  Trouble relaxing 3  Restless 0  Easily annoyed or irritable 3  Afraid - awful might happen 3  Total GAD 7 Score 18  Anxiety Difficulty Extremely difficult     Outpatient Encounter Medications as of 02/24/2022  Medication Sig   hydrOXYzine (ATARAX) 25 MG tablet Take 1 tablet (25 mg total) by mouth 3 (three) times daily as needed.   sertraline (ZOLOFT) 50 MG tablet Take 1 tablet (50 mg total) by mouth once daily.   amitriptyline (ELAVIL) 50 MG tablet Take 1 tablet (50 mg total) by mouth once nightly at bedtime.   gabapentin (NEURONTIN) 300 MG capsule Take 1 capsule (300 mg total) by mouth once nightly at bedtime.   HYDROcodone-acetaminophen (NORCO/VICODIN) 5-325 MG tablet Take 1 tablet by mouth every  6 (six) hours as needed for severe pain.   olmesartan-hydrochlorothiazide (BENICAR HCT) 20-12.5 MG tablet Take 1 tablet by mouth once daily.   pantoprazole (PROTONIX) 40 MG tablet Take 1 tablet (40 mg total) by mouth once daily.   [DISCONTINUED] amitriptyline (ELAVIL) 50 MG tablet Take 50 mg by mouth at bedtime.   [DISCONTINUED] benzonatate (TESSALON) 100 MG capsule Take 100 mg by mouth.   [DISCONTINUED] cefdinir (OMNICEF) 300 MG capsule Take 300 mg by mouth.   [DISCONTINUED] clindamycin (CLEOCIN) 300 MG capsule Take 300 mg by mouth.   [DISCONTINUED] gabapentin (NEURONTIN) 300 MG capsule gabapentin 300 mg capsule  TAKE ONE CAPSULE BY MOUTH EVERY NIGHT AT BEDTIME   [DISCONTINUED] olmesartan-hydrochlorothiazide (BENICAR HCT) 20-12.5 MG tablet Take 1 tablet by mouth daily.   [DISCONTINUED] ondansetron (ZOFRAN-ODT) 8 MG disintegrating tablet Take 8 mg by mouth.    [DISCONTINUED] pantoprazole (PROTONIX) 40 MG tablet Take 1 tablet (40 mg total) by mouth 2 (two) times daily.   Facility-Administered Encounter Medications as of 02/24/2022  Medication   cloNIDine (CATAPRES) tablet 0.2 mg    Past Medical History:  Diagnosis Date   Anxiety    history - no meds   Arthritis    bulging disc L4/5  L5/S1   Chronic back pain    Cystitis 07/2008   under care of [facility]   Depression    history - no meds   GERD (gastroesophageal reflux disease)    H/O hiatal hernia    no meds - diet controlled   Hepatitis    History Hep A at age 68 yrs - no prev problems   Hyperlipidemia    Hypertension    Insomnia    Macular degeneration 2013   Age related per pt - no meds   Pain, dental 2010   history - teeth ext per patient   Plantar fasciitis    s/p left gastroc slide    S/P dilatation of esophageal stricture 2006-2008    Past Surgical History:  Procedure Laterality Date   CESAREAN SECTION     1991   CHOLECYSTECTOMY  1991   COLONOSCOPY     dilatation of esophageal stricture  2005-2008   DILATION AND EVACUATION  02/09/2012   Procedure: DILATATION AND EVACUATION;  Surgeon: Osborne Oman, MD;  Location: [facility];  Service: Gynecology;  Laterality: N/A;  With ultrasound guidance   DILATION AND EVACUATION N/A 08/29/2014   Procedure: DILATATION AND EVACUATION (D&E) 2ND TRIMESTER;  Surgeon: Mora Bellman, MD;  Location: [facility];  Service: Gynecology;  Laterality: N/A;   ESOPHAGOGASTRODUODENOSCOPY N/A 09/30/2017   Procedure: ESOPHAGOGASTRODUODENOSCOPY (EGD);  Surgeon: Wonda Horner, MD;  Location: [facility];  Service: Endoscopy;  Laterality: N/A;   gastroc slide  2005   left side    Pancystourethroscopy     plantar fasciitis     left foot   SVD  1999   x 1    Family History  Problem Relation Age of Onset   Diabetes Mother    Hyperlipidemia Mother    Stroke Mother    Hypertension Mother    Arthritis Mother    Heart disease Father     Hypertension Father    Hypertension Brother     Social History   Socioeconomic History   Marital status: Single    Spouse name: Not on file   Number of children: Not on file   Years of education: Not on file   Highest education level: Not on file  Occupational History  Not on file  Tobacco Use   Smoking status: Every Day    Packs/day: 0.25    Years: 8.00    Total pack years: 2.00    Types: Cigarettes   Smokeless tobacco: Never   Tobacco comments:    4-5 cigs/day  Substance and Sexual Activity   Alcohol use: No    Comment: rarely   Drug use: No   Sexual activity: Yes    Birth control/protection: None  Other Topics Concern   Not on file  Social History Narrative   Current smoker 1/2 PPD   Denies alcohol use   2 children    Social Determinants of Radio broadcast assistant Strain: Not on file  Food Insecurity: Not on file  Transportation Needs: Not on file  Physical Activity: Not on file  Stress: Not on file  Social Connections: Not on file  Intimate Partner Violence: Not on file    Review of Systems  Constitutional: Negative.   HENT: Negative.    Eyes: Negative.   Respiratory:  Negative for shortness of breath.   Cardiovascular:  Negative for chest pain.  Gastrointestinal: Negative.   Genitourinary: Negative.   Musculoskeletal: Negative.   Skin: Negative.   Neurological:  Positive for headaches.  Endo/Heme/Allergies: Negative.   Psychiatric/Behavioral:  Positive for depression. Negative for suicidal ideas. The patient is nervous/anxious and has insomnia.         Objective    BP (!) 168/100 (BP Location: Left Arm)   Pulse 99   Resp 18   Ht 5' (1.524 m)   Wt 187 lb (84.8 kg)   LMP 07/27/2019 (Approximate)   SpO2 96%   BMI 36.52 kg/m   Physical Exam Vitals reviewed.  Constitutional:      Appearance: Normal appearance.  HENT:     Head: Normocephalic and atraumatic.     Right Ear: External ear normal.     Left Ear: External ear normal.      Nose: Nose normal.     Mouth/Throat:     Mouth: Mucous membranes are moist.     Pharynx: Oropharynx is clear.  Eyes:     Extraocular Movements: Extraocular movements intact.     Conjunctiva/sclera: Conjunctivae normal.     Pupils: Pupils are equal, round, and reactive to light.  Cardiovascular:     Rate and Rhythm: Normal rate and regular rhythm.     Pulses: Normal pulses.     Heart sounds: Normal heart sounds.  Pulmonary:     Effort: Pulmonary effort is normal.     Breath sounds: Normal breath sounds.  Musculoskeletal:        General: Normal range of motion.     Cervical back: Normal range of motion.  Skin:    General: Skin is warm and dry.  Neurological:     General: No focal deficit present.     Mental Status: She is alert and oriented to person, place, and time.  Psychiatric:        Mood and Affect: Mood normal.        Thought Content: Thought content normal.        Judgment: Judgment normal.        Assessment & Plan:   Problem List Items Addressed This Visit       Cardiovascular and Mediastinum   Essential hypertension - Primary   Relevant Medications   olmesartan-hydrochlorothiazide (BENICAR HCT) 20-12.5 MG tablet   cloNIDine (CATAPRES) tablet 0.2 mg   Other Relevant Orders  CBC with Differential/Platelet   Comp. Metabolic Panel (12)   Lipid panel   TSH   Hypertensive urgency   Relevant Medications   olmesartan-hydrochlorothiazide (BENICAR HCT) 20-12.5 MG tablet   cloNIDine (CATAPRES) tablet 0.2 mg     Digestive   ESOPHAGEAL STRICTURE   Relevant Medications   pantoprazole (PROTONIX) 40 MG tablet     Other   GAD (generalized anxiety disorder)   Relevant Medications   amitriptyline (ELAVIL) 50 MG tablet   hydrOXYzine (ATARAX) 25 MG tablet   sertraline (ZOLOFT) 50 MG tablet   Other Relevant Orders   Vitamin D, 25-hydroxy   LEG PAIN, BILATERAL   Relevant Medications   amitriptyline (ELAVIL) 50 MG tablet   gabapentin (NEURONTIN) 300 MG capsule    Insomnia   Severe episode of recurrent major depressive disorder, without psychotic features (HCC)   Relevant Medications   amitriptyline (ELAVIL) 50 MG tablet   hydrOXYzine (ATARAX) 25 MG tablet   sertraline (ZOLOFT) 50 MG tablet   Class 2 obesity due to excess calories with body mass index (BMI) of 36.0 to 36.9 in adult   Other Visit Diagnoses     Encounter for HCV screening test for low risk patient       Relevant Orders   HCV Ab w Reflex to Quant PCR     1. Essential hypertension Resume regimen.  Patient encouraged to check blood pressure at home on a daily basis, keep a written log and have available for all office visits.  Patient given appointment for [facility].  Patient given application for [place] financial assistance, medications sent to community pharmacy at [facility] to help with financial constraints, patient given appointment to [facility] with Dr. Joya Gaskins on April 05, 2022.  Red flags given for prompt reevaluation. - olmesartan-hydrochlorothiazide (BENICAR HCT) 20-12.5 MG tablet; Take 1 tablet by mouth once daily.  Dispense: 30 tablet; Refill: 1 - CBC with Differential/Platelet; Future - Comp. Metabolic Panel (12); Future - Lipid panel; Future - TSH; Future  2. Hypertensive urgency Dose given in clinic, patient education given on potential side effects. - cloNIDine (CATAPRES) tablet 0.2 mg  3. GAD (generalized anxiety disorder) Trial Zoloft, hydroxyzine.  Patient education given on coping skills - hydrOXYzine (ATARAX) 25 MG tablet; Take 1 tablet (25 mg total) by mouth 3 (three) times daily as needed.  Dispense: 60 tablet; Refill: 1 - sertraline (ZOLOFT) 50 MG tablet; Take 1 tablet (50 mg total) by mouth once daily.  Dispense: 30 tablet; Refill: 3 - Vitamin D, 25-hydroxy; Future  4. Severe episode of recurrent major depressive disorder, without  psychotic features ([place])   5. Psychophysiological insomnia Patient education given on good sleep hygiene  6. Pain in both lower extremities Continue current regimen - amitriptyline (ELAVIL) 50 MG tablet; Take 1 tablet (50 mg total) by mouth once nightly at bedtime.  Dispense: 30 tablet; Refill: 1 - gabapentin (NEURONTIN) 300 MG capsule; Take 1 capsule (300 mg total) by mouth once nightly at bedtime.  Dispense: 30 capsule; Refill: 2  7. ESOPHAGEAL STRICTURE Continue current regimen - pantoprazole (PROTONIX) 40 MG tablet; Take 1 tablet (40 mg total) by mouth once daily.  Dispense: 30 tablet; Refill: 1  8. Encounter for HCV screening test for low risk patient  - HCV Ab w Reflex to Quant PCR; Future  9. Class 2 obesity due to excess calories with body mass index (BMI) of 36.0  to 36.9 in adult, unspecified whether serious comorbidity present   I have reviewed the patient's medical history (PMH, PSH, Social History, Family History, Medications, and allergies) , and have been updated if relevant. I spent 39 minutes reviewing chart and  face to face time with patient.    Return in about 6 weeks (around 04/05/2022) for [facility], To establish PCP.   Loraine Grip Mayers, PA-C

## 2022-02-25 ENCOUNTER — Other Ambulatory Visit: Payer: Self-pay

## 2022-02-25 ENCOUNTER — Ambulatory Visit: Payer: Medicaid Other | Attending: Critical Care Medicine

## 2022-02-25 ENCOUNTER — Other Ambulatory Visit (HOSPITAL_COMMUNITY): Payer: Self-pay

## 2022-02-25 DIAGNOSIS — E6609 Other obesity due to excess calories: Secondary | ICD-10-CM | POA: Insufficient documentation

## 2022-02-25 DIAGNOSIS — I160 Hypertensive urgency: Secondary | ICD-10-CM | POA: Insufficient documentation

## 2022-02-25 DIAGNOSIS — Z1159 Encounter for screening for other viral diseases: Secondary | ICD-10-CM

## 2022-02-25 DIAGNOSIS — F332 Major depressive disorder, recurrent severe without psychotic features: Secondary | ICD-10-CM | POA: Insufficient documentation

## 2022-02-25 DIAGNOSIS — Z6836 Body mass index (BMI) 36.0-36.9, adult: Secondary | ICD-10-CM | POA: Insufficient documentation

## 2022-02-25 DIAGNOSIS — I10 Essential (primary) hypertension: Secondary | ICD-10-CM | POA: Insufficient documentation

## 2022-02-25 DIAGNOSIS — F411 Generalized anxiety disorder: Secondary | ICD-10-CM

## 2022-02-25 MED ORDER — CLONIDINE HCL 0.1 MG PO TABS: 0.2000 mg | ORAL_TABLET | Freq: Once | ORAL | Status: DC

## 2022-02-26 LAB — LIPID PANEL
Chol/HDL Ratio: 6.1 ratio — ABNORMAL HIGH (ref 0.0–4.4)
Cholesterol, Total: 343 mg/dL — ABNORMAL HIGH (ref 100–199)
HDL: 56 mg/dL (ref >39)
LDL Chol Calc (NIH): 218 mg/dL — ABNORMAL HIGH (ref 0–99)
Triglycerides: 331 mg/dL — ABNORMAL HIGH (ref 0–149)
VLDL Cholesterol Cal: 69 mg/dL — ABNORMAL HIGH (ref 5–40)

## 2022-02-26 LAB — HCV AB W REFLEX TO QUANT PCR: HCV Ab: NONREACTIVE

## 2022-02-26 LAB — COMP. METABOLIC PANEL (12)
AST: 20 IU/L (ref 0–40)
Albumin/Globulin Ratio: 1.6 (ref 1.2–2.2)
Albumin: 4.7 g/dL (ref 3.8–4.8)
Alkaline Phosphatase: 124 IU/L — ABNORMAL HIGH (ref 44–121)
BUN/Creatinine Ratio: 10 (ref 9–23)
BUN: 8 mg/dL (ref 6–24)
Bilirubin Total: 0.2 mg/dL (ref 0.0–1.2)
Calcium: 10.1 mg/dL (ref 8.7–10.2)
Chloride: 97 mmol/L (ref 96–106)
Creatinine, Ser: 0.82 mg/dL (ref 0.57–1.00)
Globulin, Total: 3 g/dL (ref 1.5–4.5)
Glucose: 107 mg/dL — ABNORMAL HIGH (ref 70–99)
Potassium: 3.9 mmol/L (ref 3.5–5.2)
Sodium: 142 mmol/L (ref 134–144)
Total Protein: 7.7 g/dL (ref 6.0–8.5)
eGFR: 87 mL/min/{1.73_m2} (ref >59)

## 2022-02-26 LAB — CBC WITH DIFFERENTIAL/PLATELET
Basophils Absolute: 0.1 10*3/uL (ref 0.0–0.2)
Basos: 1 %
EOS (ABSOLUTE): 0.3 10*3/uL (ref 0.0–0.4)
Eos: 3 %
Hematocrit: 48.8 % — ABNORMAL HIGH (ref 34.0–46.6)
Hemoglobin: 16.3 g/dL — ABNORMAL HIGH (ref 11.1–15.9)
Immature Grans (Abs): 0 10*3/uL (ref 0.0–0.1)
Immature Granulocytes: 0 %
Lymphocytes Absolute: 2.4 10*3/uL (ref 0.7–3.1)
Lymphs: 25 %
MCH: 30.7 pg (ref 26.6–33.0)
MCHC: 33.4 g/dL (ref 31.5–35.7)
MCV: 92 fL (ref 79–97)
Monocytes Absolute: 0.7 10*3/uL (ref 0.1–0.9)
Monocytes: 7 %
Neutrophils Absolute: 6.1 10*3/uL (ref 1.4–7.0)
Neutrophils: 64 %
Platelets: 330 10*3/uL (ref 150–450)
RBC: 5.31 x10E6/uL — ABNORMAL HIGH (ref 3.77–5.28)
RDW: 13.1 % (ref 11.7–15.4)
WBC: 9.4 10*3/uL (ref 3.4–10.8)

## 2022-02-26 LAB — HCV INTERPRETATION

## 2022-02-26 LAB — VITAMIN D 25 HYDROXY (VIT D DEFICIENCY, FRACTURES): Vit D, 25-Hydroxy: 8.3 ng/mL — ABNORMAL LOW (ref 30.0–100.0)

## 2022-02-26 LAB — TSH: TSH: 2.5 u[IU]/mL (ref 0.450–4.500)

## 2022-03-02 ENCOUNTER — Other Ambulatory Visit: Payer: Self-pay

## 2022-03-02 MED ORDER — ATORVASTATIN CALCIUM 20 MG PO TABS
20.0000 mg | ORAL_TABLET | Freq: Every day | ORAL | 2 refills | Status: DC
  Filled 2022-03-02: qty 30, 30d supply, fill #0

## 2022-03-02 MED ORDER — VITAMIN D (ERGOCALCIFEROL) 1.25 MG (50000 UNIT) PO CAPS
50000.0000 [IU] | ORAL_CAPSULE | ORAL | 2 refills | Status: DC
  Filled 2022-03-02: qty 4, 28d supply, fill #0
  Filled 2022-03-26: qty 4, 28d supply, fill #1

## 2022-03-02 NOTE — Addendum Note (Signed)
Addended by: Kennieth Rad on: 03/02/2022 02:22 PM   Modules accepted: Orders

## 2022-03-03 ENCOUNTER — Other Ambulatory Visit: Payer: Self-pay

## 2022-03-03 ENCOUNTER — Telehealth: Payer: Self-pay | Admitting: *Deleted

## 2022-03-03 NOTE — Telephone Encounter (Signed)
Patient called office for results and was notified:  Please call patient and let her know that her thyroid function, kidney and liver function are within normal limits.  She does not show signs of anemia.  Her screening for hepatitis C was negative.   Her cholesterol is very elevated, it is important that she follow a low-cholesterol diet and start cholesterol medication at this time.  She should have these levels rechecked in 3 to 6 months.   Her vitamin D levels are low, she needs to take 50,000 units once a week for the next 12 weeks.   Prescriptions sent to her pharmacy.

## 2022-03-26 ENCOUNTER — Other Ambulatory Visit: Payer: Self-pay

## 2022-03-29 ENCOUNTER — Emergency Department (HOSPITAL_COMMUNITY)
Admission: EM | Admit: 2022-03-29 | Discharge: 2022-03-30 | Disposition: A | Discharge status: Home / Self Care | Payer: Self-pay | Attending: Emergency Medicine | Admitting: Emergency Medicine

## 2022-03-29 ENCOUNTER — Other Ambulatory Visit: Payer: Self-pay

## 2022-03-29 ENCOUNTER — Encounter (HOSPITAL_COMMUNITY): Payer: Self-pay | Admitting: Psychiatry

## 2022-03-29 ENCOUNTER — Ambulatory Visit (HOSPITAL_COMMUNITY)
Admission: EM | Admit: 2022-03-29 | Discharge: 2022-03-29 | Payer: No Payment, Other | Attending: Psychiatry | Admitting: Psychiatry

## 2022-03-29 ENCOUNTER — Encounter (HOSPITAL_COMMUNITY): Payer: Self-pay | Admitting: Emergency Medicine

## 2022-03-29 DIAGNOSIS — R4182 Altered mental status, unspecified: Secondary | ICD-10-CM | POA: Insufficient documentation

## 2022-03-29 DIAGNOSIS — F1011 Alcohol abuse, in remission: Secondary | ICD-10-CM

## 2022-03-29 DIAGNOSIS — I10 Essential (primary) hypertension: Secondary | ICD-10-CM | POA: Insufficient documentation

## 2022-03-29 DIAGNOSIS — E876 Hypokalemia: Secondary | ICD-10-CM | POA: Insufficient documentation

## 2022-03-29 DIAGNOSIS — G479 Sleep disorder, unspecified: Secondary | ICD-10-CM | POA: Insufficient documentation

## 2022-03-29 DIAGNOSIS — R519 Headache, unspecified: Secondary | ICD-10-CM | POA: Insufficient documentation

## 2022-03-29 DIAGNOSIS — R7401 Elevation of levels of liver transaminase levels: Secondary | ICD-10-CM

## 2022-03-29 DIAGNOSIS — F1010 Alcohol abuse, uncomplicated: Secondary | ICD-10-CM | POA: Insufficient documentation

## 2022-03-29 DIAGNOSIS — F10932 Alcohol use, unspecified with withdrawal with perceptual disturbance: Secondary | ICD-10-CM | POA: Diagnosis present

## 2022-03-29 DIAGNOSIS — R748 Abnormal levels of other serum enzymes: Secondary | ICD-10-CM | POA: Insufficient documentation

## 2022-03-29 DIAGNOSIS — Z20822 Contact with and (suspected) exposure to covid-19: Secondary | ICD-10-CM | POA: Insufficient documentation

## 2022-03-29 DIAGNOSIS — F10251 Alcohol dependence with alcohol-induced psychotic disorder with hallucinations: Secondary | ICD-10-CM | POA: Insufficient documentation

## 2022-03-29 DIAGNOSIS — F10232 Alcohol dependence with withdrawal with perceptual disturbance: Secondary | ICD-10-CM | POA: Insufficient documentation

## 2022-03-29 DIAGNOSIS — F32A Depression, unspecified: Secondary | ICD-10-CM | POA: Insufficient documentation

## 2022-03-29 DIAGNOSIS — D72829 Elevated white blood cell count, unspecified: Secondary | ICD-10-CM | POA: Insufficient documentation

## 2022-03-29 DIAGNOSIS — R441 Visual hallucinations: Secondary | ICD-10-CM | POA: Insufficient documentation

## 2022-03-29 DIAGNOSIS — F10951 Alcohol use, unspecified with alcohol-induced psychotic disorder with hallucinations: Secondary | ICD-10-CM | POA: Diagnosis present

## 2022-03-29 DIAGNOSIS — Z79899 Other long term (current) drug therapy: Secondary | ICD-10-CM | POA: Insufficient documentation

## 2022-03-29 DIAGNOSIS — F419 Anxiety disorder, unspecified: Secondary | ICD-10-CM | POA: Insufficient documentation

## 2022-03-29 LAB — COMPREHENSIVE METABOLIC PANEL
ALT: 215 U/L — ABNORMAL HIGH (ref 0–44)
ALT: 197 U/L — ABNORMAL HIGH (ref 0–44)
AST: 249 U/L — ABNORMAL HIGH (ref 15–41)
AST: 208 U/L — ABNORMAL HIGH (ref 15–41)
Albumin: 3.7 g/dL (ref 3.5–5.0)
Albumin: 3.7 g/dL (ref 3.5–5.0)
Alkaline Phosphatase: 142 U/L — ABNORMAL HIGH (ref 38–126)
Alkaline Phosphatase: 144 U/L — ABNORMAL HIGH (ref 38–126)
Anion gap: 14 (ref 5–15)
Anion gap: 14 (ref 5–15)
BUN: 6 mg/dL (ref 6–20)
BUN: 9 mg/dL (ref 6–20)
CO2: 26 mmol/L (ref 22–32)
CO2: 28 mmol/L (ref 22–32)
Calcium: 8.7 mg/dL — ABNORMAL LOW (ref 8.9–10.3)
Calcium: 8.6 mg/dL — ABNORMAL LOW (ref 8.9–10.3)
Chloride: 95 mmol/L — ABNORMAL LOW (ref 98–111)
Chloride: 93 mmol/L — ABNORMAL LOW (ref 98–111)
Creatinine, Ser: 0.97 mg/dL (ref 0.44–1.00)
Creatinine, Ser: 0.97 mg/dL (ref 0.44–1.00)
GFR, Estimated: >60 mL/min (ref >60)
GFR, Estimated: >60 mL/min (ref >60)
Glucose, Bld: 112 mg/dL — ABNORMAL HIGH (ref 70–99)
Glucose, Bld: 117 mg/dL — ABNORMAL HIGH (ref 70–99)
Potassium: 2.9 mmol/L — ABNORMAL LOW (ref 3.5–5.1)
Potassium: 2.9 mmol/L — ABNORMAL LOW (ref 3.5–5.1)
Sodium: 135 mmol/L (ref 135–145)
Sodium: 135 mmol/L (ref 135–145)
Total Bilirubin: 0.7 mg/dL (ref 0.3–1.2)
Total Bilirubin: 0.9 mg/dL (ref 0.3–1.2)
Total Protein: 7 g/dL (ref 6.5–8.1)
Total Protein: 6.7 g/dL (ref 6.5–8.1)

## 2022-03-29 LAB — CBC WITH DIFFERENTIAL/PLATELET
Abs Immature Granulocytes: 0.07 10*3/uL (ref 0.00–0.07)
Basophils Absolute: 0.1 10*3/uL (ref 0.0–0.1)
Basophils Relative: 1 %
Eosinophils Absolute: 0.2 10*3/uL (ref 0.0–0.5)
Eosinophils Relative: 1 %
HCT: 38.6 % (ref 36.0–46.0)
Hemoglobin: 13.3 g/dL (ref 12.0–15.0)
Immature Granulocytes: 1 %
Lymphocytes Relative: 17 %
Lymphs Abs: 2.1 10*3/uL (ref 0.7–4.0)
MCH: 30.6 pg (ref 26.0–34.0)
MCHC: 34.5 g/dL (ref 30.0–36.0)
MCV: 88.9 fL (ref 80.0–100.0)
Monocytes Absolute: 0.7 10*3/uL (ref 0.1–1.0)
Monocytes Relative: 5 %
Neutro Abs: 9.4 10*3/uL — ABNORMAL HIGH (ref 1.7–7.7)
Neutrophils Relative %: 75 %
Platelets: 264 10*3/uL (ref 150–400)
RBC: 4.34 MIL/uL (ref 3.87–5.11)
RDW: 12.8 % (ref 11.5–15.5)
WBC: 12.5 10*3/uL — ABNORMAL HIGH (ref 4.0–10.5)
nRBC: 0 % (ref 0.0–0.2)

## 2022-03-29 LAB — POCT URINE DRUG SCREEN - MANUAL ENTRY (I-SCREEN)
POC Amphetamine UR: NOT DETECTED
POC Buprenorphine (BUP): NOT DETECTED
POC Cocaine UR: NOT DETECTED
POC Marijuana UR: NOT DETECTED
POC Methadone UR: NOT DETECTED
POC Methamphetamine UR: NOT DETECTED
POC Morphine: NOT DETECTED
POC Oxazepam (BZO): NOT DETECTED
POC Oxycodone UR: NOT DETECTED
POC Secobarbital (BAR): NOT DETECTED

## 2022-03-29 LAB — CBC
HCT: 37.5 % (ref 36.0–46.0)
Hemoglobin: 12.9 g/dL (ref 12.0–15.0)
MCH: 30.9 pg (ref 26.0–34.0)
MCHC: 34.4 g/dL (ref 30.0–36.0)
MCV: 89.9 fL (ref 80.0–100.0)
Platelets: 255 10*3/uL (ref 150–400)
RBC: 4.17 MIL/uL (ref 3.87–5.11)
RDW: 12.9 % (ref 11.5–15.5)
WBC: 11.7 10*3/uL — ABNORMAL HIGH (ref 4.0–10.5)
nRBC: 0 % (ref 0.0–0.2)

## 2022-03-29 LAB — LIPID PANEL
Cholesterol: 243 mg/dL — ABNORMAL HIGH (ref 0–200)
HDL: 58 mg/dL (ref >40)
LDL Cholesterol: UNDETERMINED mg/dL (ref 0–99)
Total CHOL/HDL Ratio: 4.2 RATIO
Triglycerides: 474 mg/dL — ABNORMAL HIGH (ref <150)
VLDL: UNDETERMINED mg/dL (ref 0–40)

## 2022-03-29 LAB — RAPID URINE DRUG SCREEN, HOSP PERFORMED
Amphetamines: NOT DETECTED
Barbiturates: NOT DETECTED
Benzodiazepines: NOT DETECTED
Cocaine: NOT DETECTED
Opiates: NOT DETECTED
Tetrahydrocannabinol: NOT DETECTED

## 2022-03-29 LAB — PREGNANCY, URINE: Preg Test, Ur: NEGATIVE

## 2022-03-29 LAB — ETHANOL
Alcohol, Ethyl (B): <10 mg/dL (ref <10)
Alcohol, Ethyl (B): <10 mg/dL (ref <10)

## 2022-03-29 LAB — RESP PANEL BY RT-PCR (FLU A&B, COVID) ARPGX2
Influenza A by PCR: NEGATIVE
Influenza B by PCR: NEGATIVE
SARS Coronavirus 2 by RT PCR: NEGATIVE

## 2022-03-29 LAB — LDL CHOLESTEROL, DIRECT: Direct LDL: 137 mg/dL — ABNORMAL HIGH (ref 0–99)

## 2022-03-29 LAB — TSH: TSH: 1.575 u[IU]/mL (ref 0.350–4.500)

## 2022-03-29 LAB — POCT PREGNANCY, URINE: Preg Test, Ur: NEGATIVE

## 2022-03-29 LAB — HEMOGLOBIN A1C
Hgb A1c MFr Bld: 6.1 % — ABNORMAL HIGH (ref 4.8–5.6)
Mean Plasma Glucose: 128.37 mg/dL

## 2022-03-29 LAB — POC SARS CORONAVIRUS 2 AG: SARSCOV2ONAVIRUS 2 AG: NEGATIVE

## 2022-03-29 MED ORDER — THIAMINE HCL 100 MG PO TABS
100.0000 mg | ORAL_TABLET | Freq: Every day | ORAL | Status: DC
Start: 2022-03-30 — End: 2022-03-29

## 2022-03-29 MED ORDER — LOPERAMIDE HCL 2 MG PO CAPS: 2.0000 mg | ORAL_CAPSULE | ORAL | Status: DC | PRN

## 2022-03-29 MED ORDER — SERTRALINE HCL 50 MG PO TABS: 50.0000 mg | ORAL_TABLET | Freq: Every day | ORAL | Status: DC

## 2022-03-29 MED ORDER — LORAZEPAM 1 MG PO TABS: 0.0000 mg | ORAL_TABLET | Freq: Four times a day (QID) | ORAL | Status: DC

## 2022-03-29 MED ORDER — POTASSIUM CHLORIDE CRYS ER 20 MEQ PO TBCR
40.0000 meq | EXTENDED_RELEASE_TABLET | Freq: Once | ORAL | Status: AC
  Administered 2022-03-29: 40 meq via ORAL
  Filled 2022-03-29: qty 2

## 2022-03-29 MED ORDER — ALUM & MAG HYDROXIDE-SIMETH 200-200-20 MG/5ML PO SUSP: 30.0000 mL | ORAL | Status: DC | PRN

## 2022-03-29 MED ORDER — GABAPENTIN 300 MG PO CAPS: 300.0000 mg | ORAL_CAPSULE | Freq: Every day | ORAL | Status: DC

## 2022-03-29 MED ORDER — OLMESARTAN MEDOXOMIL-HCTZ 20-12.5 MG PO TABS: 1.0000 | ORAL_TABLET | Freq: Every day | ORAL | Status: DC

## 2022-03-29 MED ORDER — THIAMINE HCL 100 MG/ML IJ SOLN
100.0000 mg | Freq: Every day | INTRAMUSCULAR | Status: DC
Start: 2022-03-29 — End: 2022-03-30
  Administered 2022-03-29: 100 mg via INTRAVENOUS
  Filled 2022-03-29: qty 2

## 2022-03-29 MED ORDER — IRBESARTAN 75 MG PO TABS: 150.0000 mg | ORAL_TABLET | Freq: Every day | ORAL | Status: DC

## 2022-03-29 MED ORDER — ADULT MULTIVITAMIN W/MINERALS CH: 1.0000 | ORAL_TABLET | Freq: Every day | ORAL | Status: DC

## 2022-03-29 MED ORDER — ZIPRASIDONE MESYLATE 20 MG IM SOLR: 20.0000 mg | INTRAMUSCULAR | Status: DC | PRN

## 2022-03-29 MED ORDER — LORAZEPAM 1 MG PO TABS: 0.0000 mg | ORAL_TABLET | Freq: Two times a day (BID) | ORAL | Status: DC

## 2022-03-29 MED ORDER — LORAZEPAM 1 MG PO TABS: 1.0000 mg | ORAL_TABLET | ORAL | Status: DC | PRN

## 2022-03-29 MED ORDER — ONDANSETRON 4 MG PO TBDP: 4.0000 mg | ORAL_TABLET | Freq: Four times a day (QID) | ORAL | Status: DC | PRN

## 2022-03-29 MED ORDER — POTASSIUM CHLORIDE 10 MEQ/100ML IV SOLN
10.0000 meq | Freq: Once | INTRAVENOUS | Status: AC
  Administered 2022-03-30: 10 meq via INTRAVENOUS
  Filled 2022-03-29: qty 100

## 2022-03-29 MED ORDER — TRAZODONE HCL 50 MG PO TABS: 50.0000 mg | ORAL_TABLET | Freq: Every evening | ORAL | Status: DC | PRN

## 2022-03-29 MED ORDER — HYDROCHLOROTHIAZIDE 12.5 MG PO TABS: 12.5000 mg | ORAL_TABLET | Freq: Every day | ORAL | Status: DC

## 2022-03-29 MED ORDER — LORAZEPAM 1 MG PO TABS: 1.0000 mg | ORAL_TABLET | Freq: Four times a day (QID) | ORAL | Status: DC | PRN

## 2022-03-29 MED ORDER — HYDROXYZINE HCL 25 MG PO TABS: 25.0000 mg | ORAL_TABLET | Freq: Four times a day (QID) | ORAL | Status: DC | PRN

## 2022-03-29 MED ORDER — FOLIC ACID 1 MG PO TABS
1.0000 mg | ORAL_TABLET | Freq: Every day | ORAL | Status: DC
  Administered 2022-03-29 – 2022-03-30 (×2): 1 mg via ORAL
  Filled 2022-03-29 (×2): qty 1

## 2022-03-29 MED ORDER — OLANZAPINE 5 MG PO TBDP: 5.0000 mg | ORAL_TABLET | Freq: Three times a day (TID) | ORAL | Status: DC | PRN

## 2022-03-29 MED ORDER — ADULT MULTIVITAMIN W/MINERALS CH
1.0000 | ORAL_TABLET | Freq: Every day | ORAL | Status: DC
  Administered 2022-03-29 – 2022-03-30 (×2): 1 via ORAL
  Filled 2022-03-29 (×2): qty 1

## 2022-03-29 MED ORDER — LORAZEPAM 2 MG/ML IJ SOLN: 1.0000 mg | INTRAMUSCULAR | Status: DC | PRN

## 2022-03-29 MED ORDER — PANTOPRAZOLE SODIUM 40 MG PO TBEC: 40.0000 mg | DELAYED_RELEASE_TABLET | Freq: Every day | ORAL | Status: DC

## 2022-03-29 MED ORDER — SODIUM CHLORIDE 0.9 % IV BOLUS
1000.0000 mL | Freq: Once | INTRAVENOUS | Status: AC
  Administered 2022-03-29: 1000 mL via INTRAVENOUS

## 2022-03-29 MED ORDER — THIAMINE HCL 100 MG PO TABS
100.0000 mg | ORAL_TABLET | Freq: Every day | ORAL | Status: DC
  Administered 2022-03-30: 100 mg via ORAL
  Filled 2022-03-29 (×2): qty 1

## 2022-03-29 MED ORDER — MAGNESIUM HYDROXIDE 400 MG/5ML PO SUSP: 30.0000 mL | Freq: Every day | ORAL | Status: DC | PRN

## 2022-03-29 MED ORDER — DIPHENHYDRAMINE HCL 25 MG PO CAPS: 25.0000 mg | ORAL_CAPSULE | Freq: Every evening | ORAL | Status: DC | PRN

## 2022-03-29 MED ORDER — POTASSIUM CHLORIDE 10 MEQ/100ML IV SOLN
10.0000 meq | Freq: Once | INTRAVENOUS | Status: AC
  Administered 2022-03-29: 10 meq via INTRAVENOUS
  Filled 2022-03-29: qty 100

## 2022-03-29 MED ORDER — ACETAMINOPHEN 325 MG PO TABS: 650.0000 mg | ORAL_TABLET | Freq: Four times a day (QID) | ORAL | Status: DC | PRN

## 2022-03-29 MED ORDER — VITAMIN D (ERGOCALCIFEROL) 1.25 MG (50000 UNIT) PO CAPS
50000.0000 [IU] | ORAL_CAPSULE | ORAL | Status: DC
  Filled 2022-03-29: qty 1

## 2022-03-29 MED ORDER — THIAMINE HCL 100 MG/ML IJ SOLN: 100.0000 mg | Freq: Once | INTRAMUSCULAR | Status: DC

## 2022-03-29 MED ORDER — ATORVASTATIN CALCIUM 10 MG PO TABS: 20.0000 mg | ORAL_TABLET | Freq: Every day | ORAL | Status: DC

## 2022-03-29 MED ORDER — NICOTINE 21 MG/24HR TD PT24: 21.0000 mg | MEDICATED_PATCH | Freq: Every day | TRANSDERMAL | Status: DC

## 2022-03-29 NOTE — BH Assessment (Addendum)
[name] [name] presenting to [facility] voluntarily with her boyfriend due to having hallucinations which started this morning. Per boyfriend pt was started on Zoloft and hydroxyzine about a month ago and since then pt has been taking medications and drinking fireball. BF reports pt started acting strange and becoming "moody". However, today pt started having hallucinations "talking to people not there, seeing puppies under the bed and saying the hallway is flooded". Pt denies hallucinations currently. Pt denies SI, HI and drug use

## 2022-03-29 NOTE — ED Provider Notes (Addendum)
Care assumed from Dr. Darl Householder, patient sent from behavioral health urgent care for medical clearance because of visual hallucinations.  Noted to be hypokalemic and getting potassium supplementation, needs to have magnesium level checked.  Also pending CT of head.  Will need TTS consultation when medically cleared.  CT of head shows no acute intracranial process.  I have independently viewed the images, and agree with radiologist interpretation.  Magnesium level is 1.8 which, while normal, is in the lower end of normal and I have ordered some intravenous magnesium.  After potassium supplementation, potassium level is 3.1 which, while still low, is high enough to have the patient cleared for psychiatric evaluation.  I am ordering additional oral potassium to complete her potassium repletion.  Results for orders placed or performed during the hospital encounter of 03/29/22  Comprehensive metabolic panel  Result Value Ref Range   Sodium 135 135 - 145 mmol/L   Potassium 2.9 (L) 3.5 - 5.1 mmol/L   Chloride 93 (L) 98 - 111 mmol/L   CO2 28 22 - 32 mmol/L   Glucose, Bld 117 (H) 70 - 99 mg/dL   BUN 9 6 - 20 mg/dL   Creatinine, Ser 0.97 0.44 - 1.00 mg/dL   Calcium 8.6 (L) 8.9 - 10.3 mg/dL   Total Protein 6.7 6.5 - 8.1 g/dL   Albumin 3.7 3.5 - 5.0 g/dL   AST 208 (H) 15 - 41 U/L   ALT 197 (H) 0 - 44 U/L   Alkaline Phosphatase 144 (H) 38 - 126 U/L   Total Bilirubin 0.9 0.3 - 1.2 mg/dL   GFR, Estimated >60 >60 mL/min   Anion gap 14 5 - 15  Ethanol  Result Value Ref Range   Alcohol, Ethyl (B) <10 <10 mg/dL  cbc  Result Value Ref Range   WBC 11.7 (H) 4.0 - 10.5 K/uL   RBC 4.17 3.87 - 5.11 MIL/uL   Hemoglobin 12.9 12.0 - 15.0 g/dL   HCT 37.5 36.0 - 46.0 %   MCV 89.9 80.0 - 100.0 fL   MCH 30.9 26.0 - 34.0 pg   MCHC 34.4 30.0 - 36.0 g/dL   RDW 12.9 11.5 - 15.5 %   Platelets 255 150 - 400 K/uL   nRBC 0.0 0.0 - 0.2 %  Rapid urine drug screen (hospital performed)  Result Value Ref Range   Opiates NONE  DETECTED NONE DETECTED   Cocaine NONE DETECTED NONE DETECTED   Benzodiazepines NONE DETECTED NONE DETECTED   Amphetamines NONE DETECTED NONE DETECTED   Tetrahydrocannabinol NONE DETECTED NONE DETECTED   Barbiturates NONE DETECTED NONE DETECTED  Magnesium  Result Value Ref Range   Magnesium 1.8 1.7 - 2.4 mg/dL  Potassium  Result Value Ref Range   Potassium 3.1 (L) 3.5 - 5.1 mmol/L   CT HEAD WO CONTRAST (5MM)  Result Date: 03/30/2022 CLINICAL DATA:  Mental status change, unknown cause. Headache, new or worsening. EXAM: CT HEAD WITHOUT CONTRAST TECHNIQUE: Contiguous axial images were obtained from the base of the skull through the vertex without intravenous contrast. RADIATION DOSE REDUCTION: This exam was performed according to the departmental dose-optimization program which includes automated exposure control, adjustment of the mA and/or kV according to patient size and/or use of iterative reconstruction technique. COMPARISON:  None are available for review. FINDINGS: Brain: No acute intracranial hemorrhage, midline shift or mass effect. No extra-axial fluid collection. Gray-white matter differentiation is within normal limits. No hydrocephalus. Vascular: No hyperdense vessel or unexpected calcification. Skull: Normal. Negative for fracture or  focal lesion. Sinuses/Orbits: Debris is present in the maxillary sinus on the left. No acute orbital abnormality. Other: None. IMPRESSION: No acute intracranial process. Electronically Signed   By: Brett Fairy M.D.   On: 14/06/3012 14:38       Delora Fuel, MD 88/75/79 [phone]   Patient trying to leave [place].  Advised that she needed to stay to complete psychiatric evaluation.  Advised that I would involuntarily commit her if she did try to leave.  Patient is now agreeable to stay.  IVC paperwork has been completed, but not filed.   Delora Fuel, MD 02/12/55 [phone]

## 2022-03-29 NOTE — ED Provider Notes (Signed)
  [place] EMERGENCY DEPARTMENT Provider Note   CSN: [id number] Arrival date & time: 03/29/22  1929     History {Add pertinent medical, surgical, social history, OB history to HPI:1} Chief Complaint  Patient presents with  . Alcohol Problem    [name] [name] is a 51 y.o. female.   Alcohol Problem  Patient is a 51 year old female with a history of HTN, HLD, GERD, anxiety/depression who presents emergency department for evaluation of hallucinations.     Home Medications Prior to Admission medications   Medication Sig Start Date End Date Taking? Authorizing Provider  amitriptyline (ELAVIL) 50 MG tablet Take 1 tablet (50 mg total) by mouth once nightly at bedtime. 02/24/22   Mayers, Cari S, PA-C  atorvastatin (LIPITOR) 20 MG tablet Take 1 tablet (20 mg total) by mouth daily. 03/02/22   Mayers, Cari S, PA-C  gabapentin (NEURONTIN) 300 MG capsule Take 1 capsule (300 mg total) by mouth once nightly at bedtime. 02/24/22   Mayers, Cari S, PA-C  HYDROcodone-acetaminophen (NORCO/VICODIN) 5-325 MG tablet Take 1 tablet by mouth every 6 (six) hours as needed for severe pain. 07/26/20   Walisiewicz, Harley Hallmark, PA-C  hydrOXYzine (ATARAX) 25 MG tablet Take 1 tablet (25 mg total) by mouth 3 (three) times daily as needed. 02/24/22   Mayers, Cari S, PA-C  olmesartan-hydrochlorothiazide (BENICAR HCT) 20-12.5 MG tablet Take 1 tablet by mouth once daily. 02/24/22   Mayers, Cari S, PA-C  pantoprazole (PROTONIX) 40 MG tablet Take 1 tablet (40 mg total) by mouth once daily. 02/24/22   Mayers, Cari S, PA-C  sertraline (ZOLOFT) 50 MG tablet Take 1 tablet (50 mg total) by mouth once daily. 02/24/22   Mayers, Cari S, PA-C  Vitamin D, Ergocalciferol, (DRISDOL) 1.25 MG (50000 UNIT) CAPS capsule Take 1 capsule (50,000 Units total) by mouth every 7 (seven) days. 03/02/22   Mayers, Cari S, PA-C      Allergies    Penicillins and Azithromycin    Review of Systems   Review of Systems  Physical  Exam Updated Vital Signs LMP 07/27/2019 (Approximate)  Physical Exam  ED Results / Procedures / Treatments   Labs (all labs ordered are listed, but only abnormal results are displayed) Labs Reviewed - No data to display  EKG None  Radiology No results found.  Procedures Procedures  {Document cardiac monitor, telemetry assessment procedure when appropriate:1}  Medications Ordered in ED Medications - No data to display  ED Course/ Medical Decision Making/ A&P                           Medical Decision Making  ***  {Document critical care time when appropriate:1} {Document review of labs and clinical decision tools ie heart score, Chads2Vasc2 etc:1}  {Document your independent review of radiology images, and any outside records:1} {Document your discussion with family members, caretakers, and with consultants:1} {Document social determinants of health affecting pt's care:1} {Document your decision making why or why not admission, treatments were needed:1} Final Clinical Impression(s) / ED Diagnoses Final diagnoses:  None    Rx / DC Orders ED Discharge Orders     None

## 2022-03-29 NOTE — TOC CAGE-AID Note (Signed)
Transition of Care [facility]) - CAGE-AID Screening   Patient Details  Name: [name] [name] MRN: [id number] Date of Birth: Apr 10, 1971  Transition of Care [facility]) CM/SW Contact:    Gaetano Hawthorne Tarpley-Carter, [place] Phone Number: 03/29/2022, 10:49 PM   Clinical Narrative: Pt participated in [place].  Pt stated she does not use substance or ETOH, but drank ETOH on Saturday and Sunday.  Pt was offered resources, due to no usage ETOH.     [name], MSW, LCSW-A Pronouns:  She/Her/Hers [facility] Health[facility]itions of Care Clinical Social Worker Direct Number:  [phone] [email or web address]   CAGE-AID Screening:    Have You Ever Felt You Ought to Cut Down on Your Drinking or Drug Use?: No Have People Annoyed You By [employer] Your Drinking Or Drug Use?: No Have You Felt Bad Or Guilty About Your Drinking Or Drug Use?: No Have You Ever Had a Drink or Used Drugs First Thing In The Morning to Steady Your Nerves or to Get Rid of a Hangover?: No CAGE-AID Score: 0  Substance Abuse Education Offered: No  Substance abuse interventions: Scientist, clinical (histocompatibility and immunogenetics)

## 2022-03-29 NOTE — ED Provider Notes (Signed)
Behavioral Health Urgent Care Medical Screening Exam  Patient Name: [name] [name] MRN: [id number] Date of Evaluation: 03/29/22 Chief Complaint:   Diagnosis:  Final diagnoses:  Acute alcoholic hallucinosis ([place])  Alcohol withdrawal syndrome with perceptual disturbance [facility])    HPI: [name] [name] is a 51 y.o. female who presents to the behavioral health urgent care accompanied by her boyfriend due to concerns for new onset hallucinations and physical instability. The patient's boyfriend reports that she started acting unusually since beginning a new medication regimen about a month ago, which included Zoloft and an anti-anxiety medication. He also states that the patient mixed the medications with alcohol on at least one occasion.  According to the boyfriend, the patient started having hallucinations today and has not been sleeping well, frequently falling off the bed and waking up hyperventilating. He compares her condition to symptoms of heroin withdrawal, noting that she alternates between insomnia and extreme sleepiness. She also apparently "almost burned the house down" when she was cooking.  The patient confirms these symptoms and also reports a sensation in her head that she finds difficult to describe, anxiety, and intolerance to loud noises. She denies dizziness despite appearing unbalanced. She also expresses feelings of sadness and anger, feeling that her life is no longer fulfilling.  The patient denies any suicidal or homicidal ideation but reports seeing a brown creature on the floor during the interview.  She denies any chest pain, difficulty breathing, severe constipation, or issues with urination. She has a history of major depressive disorder, anxiety, and reports a significant amount of daily caffeine (energy drink) and alcohol use, and a long-term tobacco smoking history. She also takes several other medications including ones for cholesterol, blood pressure, and heartburn, but  exact details are unknown at this time.  The patient's cognition appears generally intact, she was able to provide her name, the month, the year, and remembered who brought her to the urgent care. She denies any recreational drug use.  She also reported having difficulty sleeping, getting only a couple hours of sleep the previous night. The boyfriend notes observing the patient stop breathing during sleep, prompting concerns about possible sleep apnea.  In terms of the patient's social situation, she lives in a house in [place] with her boyfriend and brother. She has utilities and access to food but has limited transportation at this time. She has completed up to the ninth grade in school and has a history of being hospitalized for mental health concerns. She has never served in the [employer]  The patient has gained approximately 15 pounds recently but reports her appetite as being pretty good. She seeks help for her mental health symptoms, particularly the hallucinations and her feelings of detachment.  Last reported drink was Friday (7/14)  A&Ox4. Denies SI/HI. Endorses VH, denies AH. Poor sleep, appetite good. +15 lbs weight gain noted.  Psychiatric Specialty Exam  Presentation  General Appearance:Disheveled   Eye Contact:Fleeting   Speech:Garbled; Slurred   Speech Volume:Normal   Mood and Affect  Mood:Dysphoric   Affect:Blunt    Thought Process  Thought Processes:Disorganized   Descriptions of Associations:Tangential   Orientation:Full (Time, Place and Person)   Thought Content:No data recorded  Diagnosis of Schizophrenia or Schizoaffective disorder in past: No data recorded   Hallucinations:Visual brown creature on floor, puppies on bed the night before   Ideas of Reference:None   Suicidal Thoughts:No   Homicidal Thoughts:No    Sensorium  Memory:Immediate Fair; Recent Fair; Remote Fair   Judgment:Fair  Insight:Poor    Executive  Functions  Concentration:Fair   Attention Span:Fair   [place]    Psychomotor Activity  Psychomotor Activity:Restlessness    Assets  Assets:Housing; Resilience; Social Support    Sleep  Sleep:Poor   Number of hours: 3.5    Nutritional Assessment (For OBS and FBC admissions only) Has the patient had a weight loss or gain of 10 pounds or more in the last 3 months?: No Has the patient had a decrease in food intake/or appetite?: No Does the patient have dental problems?: No Does the patient have eating habits or behaviors that may be indicators of an eating disorder including binging or inducing vomiting?: No Has the patient recently lost weight without trying?: 0 Has the patient been eating poorly because of a decreased appetite?: 0 Malnutrition Screening Tool Score: 0     Physical Exam: Physical Exam Vitals and nursing note reviewed.  Constitutional:      Appearance: Normal appearance.  HENT:     Head: Normocephalic and atraumatic.  Pulmonary:     Effort: Pulmonary effort is normal. No respiratory distress.  Neurological:     Mental Status: She is alert and oriented to person, place, and time.     Review of Systems  Constitutional:  Positive for malaise/fatigue.  Respiratory: Negative.    Cardiovascular: Negative.   Gastrointestinal: Negative.   Genitourinary: Negative.    Blood pressure (!) 136/102, pulse 67, temperature 98.1 F (36.7 C), temperature source Oral, resp. rate 18, height '4\' 11"'$  (1.499 m), weight 175 lb (79.4 kg), last menstrual period 07/27/2019, SpO2 100 %. Body mass index is 35.35 kg/m.   [facility] MSE Discharge Disposition for Follow up and Recommendations: Discharge Assessment and Recommendations Overall, the patient's current symptoms represent a significant departure from her baseline mental state that is of significant concern, with the new onset of hallucinations and other  neurological symptoms suggestive of possible medication side effects or interactions, said medication interactions with alcohol, or an underlying medical issue. Further evaluation and management are needed at this time.  Though she is denying suicidal or homicidal ideation, she is at the moment significantly disorganized compared to baseline. Given that her last alcoholic drink was on Friday and she is currently experiencing hallucinations likely secondary to alcohol withdrawal, she is at that time point where she is at risk of developing delirium tremens.  At this time, the behavioral urgent care [facility]) cannot adequately serve the patient's needs in-case she develops delirium tremens and should be observed in the emergency department  Based on my evaluation the patient appears to have an emergency medical condition for which I recommend the patient be transferred to the emergency department for further evaluation.   I discussed my assessment and planned treatment for the patient with Dr. Dwyane Dee who agrees with my formulated course of action.  Camelia Phenes, MD 03/29/2022, 6:33 PM

## 2022-03-29 NOTE — Progress Notes (Signed)
Transition of Care [facility]) - Emergency Department Mini Assessment   Patient Details  Name: [name] [name] MRN: [id number] Date of Birth: 07-02-1971  Transition of Care [facility]) CM/SW Contact:    Brailen Macneal C Tarpley-Carter, [place] Phone Number: 03/29/2022, 10:45 PM   Clinical Narrative: TOC CSW spoke with pt in regards to ETOH use.  Pt said she does not drink often, but has something to drink on Saturday and Sunday.  Pt stated she went to [facility] for depression and medication adjustment.  Pt was also asked if she was experiencing any SI, pt stated no.  Burnell Hurta Tarpley-Carter, MSW, LCSW-A Pronouns:  She/Her/Hers [facility] Health[facility]itions of Care Clinical Social Worker Direct Number:  [phone] [email or web address]    ED Mini Assessment: What brought you to the Emergency Department? : Alcohol Detox  Barriers to Discharge: No Barriers Identified     Means of departure: Not know  Interventions which prevented an admission or readmission: SUD counseling    Patient Contact and Communications       Contact Date: 03/29/22,          Patient states their goals for this hospitalization and ongoing recovery are:: Per patient she does not need detox, but needs treatment for depression.   Choice offered to / list presented to : NA  Admission diagnosis:  Withdrawl Patient Active Problem List   Diagnosis Date Noted   Acute alcoholic hallucinosis ([place]) 03/29/2022   Alcohol withdrawal syndrome with perceptual disturbance ([place]) 03/29/2022   Essential hypertension 02/25/2022   Hypertensive urgency 02/25/2022   Severe episode of recurrent major depressive disorder, without psychotic features ([place]) 02/25/2022   Class 2 obesity due to excess calories with body mass index (BMI) of 36.0 to 36.9 in adult 02/25/2022   Swelling of lower leg 03/15/2018   Cervical high risk HPV (human papillomavirus) test positive 12/28/2017   Abnormal vaginal bleeding 12/21/2017   Encntr for  gyn exam (general) (routine) w abnormal findings 09/24/2016   Insomnia 09/09/2014   Bereavement 09/09/2014   Missed abortion    Hematuria, undiagnosed cause 11/26/2012   Previous cesarean delivery, antepartum condition or complication 39/76/7341   Recurrent UTI 04/12/2011   LEG PAIN, BILATERAL 10/16/2009   Chronic interstitial cystitis 05/13/2009   BACK PAIN 05/13/2009   GAD (generalized anxiety disorder) 10/12/2006   TOBACCO ABUSE 10/12/2006   DEPRESSION 10/12/2006   ESOPHAGEAL STRICTURE 10/12/2006   GERD 10/12/2006   PCP:  Pcp, No Pharmacy:   [id number] [address], [address] [address] Phone: [phone] Fax: [phone]  [facility] [id number] - [place], [address] [place] [address] Phone: [phone] Fax: [place] [facility] [address] [employer], [place] [address] Phone: [phone] Fax: [phone]

## 2022-03-29 NOTE — ED Triage Notes (Signed)
Patient arrived with EMS for alcohol detox , last drink Sunday with intermittent A/V hallucinations , denies SI or HI .

## 2022-03-29 NOTE — ED Notes (Signed)
Report called to [facility] ed, non-emergent EMS also called to transport

## 2022-03-29 NOTE — BH Assessment (Addendum)
Comprehensive Clinical Assessment (CCA) Screening, Triage and Referral Note  03/29/2022 [name] [name] [id number]  Per Camelia Phenes, MD, pt recommended for overnight observation.     [place] ED from 03/29/2022 in [place] No Risk      The patient demonstrates the following risk factors for suicide: Chronic risk factors for suicide include: psychiatric disorder of MDD and substance use disorder. Acute risk factors for suicide include: N/A. Protective factors for this patient include: positive social support and positive therapeutic relationship. Considering these factors, the overall suicide risk at this point appears to be low. Patient is not appropriate for outpatient follow up.  [name] [name] presenting to [facility] voluntarily with her boyfriend due to having hallucinations which started this morning. Per boyfriend pt was started on Zoloft and hydroxyzine about a month ago and since then pt has been taking medications and drinking fireball. BF reports pt started acting strange and becoming "moody". However, today pt started having hallucinations "talking to people not there, seeing puppies under the bed and saying the hallway is flooded". Pt denies hallucinations currently. Pt denies SI, HI and drug use      Chief Complaint:  Chief Complaint  Patient presents with   Hallucinations   [name] [name] presenting to [facility] voluntarily with her boyfriend due to having hallucinations which started this morning. Per boyfriend pt was started on Zoloft and hydroxyzine about a month ago and since then pt has been taking medications and drinking fireball. BF reports pt started acting strange and becoming "moody". However, today pt started having hallucinations "talking to people not there, seeing puppies under the bed and saying the hallway is flooded". Pt denies hallucinations currently. Pt denies SI, HI and drug use      Visit Diagnosis: Alcohol use  disorder Hallucinations Depression   Patient Reported Information How did you hear about [place]? No data recorded What Is the Reason for Your Visit/Call Today? Hallucinations started this morning  How Long Has This Been Causing You Problems? 1 wk - 1 month  What Do You Feel Would Help You the Most Today? Treatment for Depression or other mood problem   Have You Recently Had Any Thoughts About Hurting Yourself? No  Are You Planning to Commit Suicide/Harm Yourself At This time? No   Have you Recently Had Thoughts About [place]? No  Are You Planning to Harm Someone at This Time? No  Explanation: No data recorded  Have You Used Any Alcohol or Drugs in the Past 24 Hours? No  How Long Ago Did You Use Drugs or Alcohol? No data recorded What Did You Use and How Much? No data recorded  Do You Currently Have a Therapist/Psychiatrist? No data recorded Name of Therapist/Psychiatrist: No data recorded  Have You Been Recently Discharged From Any Office Practice or Programs? No  Explanation of Discharge From Practice/Program: No data recorded   CCA Screening Triage Referral Assessment Type of Contact: Face-to-Face  Telemedicine Service Delivery:   Is this Initial or Reassessment? No data recorded Date Telepsych consult ordered in [facility]:  No data recorded Time Telepsych consult ordered in [facility]:  No data recorded Location of Assessment: [name]  Provider Location: [facility] Assessment Services   Collateral Involvement: Boyfriend   Does Patient Have a [place]? No data recorded Name and Contact of Legal Guardian: No data recorded If Minor and Not Living with Parent(s), Who has Custody? No data recorded Is  CPS involved or ever been involved? Never  Is APS involved or ever been involved? Never   Patient Determined To Be At Risk for Harm To Self or Others Based on Review of Patient Reported Information or Presenting Complaint?  No  Method: No data recorded Availability of Means: No data recorded Intent: No data recorded Notification Required: No data recorded Additional Information for Danger to Others Potential: No data recorded Additional Comments for Danger to Others Potential: No data recorded Are There Guns or Other Weapons in Your Home? No data recorded Types of Guns/Weapons: No data recorded Are These Weapons Safely Secured?                            No data recorded Who Could Verify You Are Able To Have These Secured: No data recorded Do You Have any Outstanding Charges, Pending Court Dates, Parole/Probation? No data recorded Contacted To Inform of Risk of Harm To Self or Others: No data recorded  Does Patient Present under Involuntary Commitment? No  IVC Papers Initial File Date: No data recorded  [place] of Residence: [place]   Patient Currently Receiving the Following Services: Not Receiving Services   Determination of Need: Urgent (48 hours)   Options For Referral: Medication Management; Outpatient Therapy   Discharge Disposition:     Luther Redo, [facility]

## 2022-03-30 ENCOUNTER — Emergency Department (HOSPITAL_COMMUNITY): Payer: Self-pay

## 2022-03-30 ENCOUNTER — Other Ambulatory Visit: Payer: Self-pay

## 2022-03-30 LAB — MAGNESIUM: Magnesium: 1.8 mg/dL (ref 1.7–2.4)

## 2022-03-30 LAB — POTASSIUM: Potassium: 3.1 mmol/L — ABNORMAL LOW (ref 3.5–5.1)

## 2022-03-30 LAB — RESP PANEL BY RT-PCR (FLU A&B, COVID) ARPGX2
Influenza A by PCR: NEGATIVE
Influenza B by PCR: NEGATIVE
SARS Coronavirus 2 by RT PCR: NEGATIVE

## 2022-03-30 MED ORDER — ATORVASTATIN CALCIUM 10 MG PO TABS
20.0000 mg | ORAL_TABLET | Freq: Every day | ORAL | Status: DC
  Administered 2022-03-30: 20 mg via ORAL
  Filled 2022-03-30: qty 2

## 2022-03-30 MED ORDER — GABAPENTIN 300 MG PO CAPS: 300.0000 mg | ORAL_CAPSULE | Freq: Every day | ORAL | Status: DC

## 2022-03-30 MED ORDER — HYDROXYZINE HCL 25 MG PO TABS
25.0000 mg | ORAL_TABLET | Freq: Four times a day (QID) | ORAL | 0 refills | Status: DC
  Filled 2022-03-30 – 2022-04-08 (×2): qty 12, 3d supply, fill #0

## 2022-03-30 MED ORDER — AMITRIPTYLINE HCL 25 MG PO TABS: 50.0000 mg | ORAL_TABLET | Freq: Every day | ORAL | Status: DC

## 2022-03-30 MED ORDER — HYDROXYZINE HCL 25 MG PO TABS: 25.0000 mg | ORAL_TABLET | Freq: Three times a day (TID) | ORAL | Status: DC | PRN

## 2022-03-30 MED ORDER — IRBESARTAN 300 MG PO TABS
150.0000 mg | ORAL_TABLET | Freq: Every day | ORAL | Status: DC
  Administered 2022-03-30: 150 mg via ORAL
  Filled 2022-03-30: qty 1

## 2022-03-30 MED ORDER — ONDANSETRON HCL 4 MG PO TABS: 4.0000 mg | ORAL_TABLET | Freq: Three times a day (TID) | ORAL | Status: DC | PRN

## 2022-03-30 MED ORDER — POTASSIUM CHLORIDE CRYS ER 20 MEQ PO TBCR: 40.0000 meq | EXTENDED_RELEASE_TABLET | ORAL | Status: DC

## 2022-03-30 MED ORDER — SERTRALINE HCL 50 MG PO TABS
50.0000 mg | ORAL_TABLET | Freq: Every day | ORAL | Status: DC
  Administered 2022-03-30: 50 mg via ORAL
  Filled 2022-03-30: qty 1

## 2022-03-30 MED ORDER — ALUM & MAG HYDROXIDE-SIMETH 200-200-20 MG/5ML PO SUSP: 30.0000 mL | Freq: Four times a day (QID) | ORAL | Status: DC | PRN

## 2022-03-30 MED ORDER — MAGNESIUM SULFATE 2 GM/50ML IV SOLN
2.0000 g | Freq: Once | INTRAVENOUS | Status: AC
  Administered 2022-03-30: 2 g via INTRAVENOUS
  Filled 2022-03-30: qty 50

## 2022-03-30 MED ORDER — NICOTINE 7 MG/24HR TD PT24
7.0000 mg | MEDICATED_PATCH | Freq: Every day | TRANSDERMAL | Status: DC
Start: 2022-03-30 — End: 2022-03-30
  Administered 2022-03-30: 7 mg via TRANSDERMAL
  Filled 2022-03-30 (×2): qty 1

## 2022-03-30 MED ORDER — HYDROCHLOROTHIAZIDE 12.5 MG PO TABS
12.5000 mg | ORAL_TABLET | Freq: Every day | ORAL | Status: DC
  Administered 2022-03-30: 12.5 mg via ORAL
  Filled 2022-03-30: qty 1

## 2022-03-30 MED ORDER — PANTOPRAZOLE SODIUM 40 MG PO TBEC
40.0000 mg | DELAYED_RELEASE_TABLET | Freq: Every day | ORAL | Status: DC
  Administered 2022-03-30: 40 mg via ORAL
  Filled 2022-03-30: qty 1

## 2022-03-30 MED ORDER — POTASSIUM CHLORIDE CRYS ER 20 MEQ PO TBCR
20.0000 meq | EXTENDED_RELEASE_TABLET | Freq: Every day | ORAL | 0 refills | Status: DC
  Filled 2022-03-30: qty 15, 15d supply, fill #0

## 2022-03-30 MED ORDER — POTASSIUM CHLORIDE CRYS ER 20 MEQ PO TBCR
40.0000 meq | EXTENDED_RELEASE_TABLET | Freq: Once | ORAL | Status: AC
  Administered 2022-03-30: 40 meq via ORAL
  Filled 2022-03-30: qty 2

## 2022-03-30 MED ORDER — POTASSIUM CHLORIDE CRYS ER 20 MEQ PO TBCR
40.0000 meq | EXTENDED_RELEASE_TABLET | ORAL | Status: DC
  Administered 2022-03-30: 40 meq via ORAL
  Filled 2022-03-30: qty 2

## 2022-03-30 MED ORDER — OLMESARTAN MEDOXOMIL-HCTZ 20-12.5 MG PO TABS
1.0000 | ORAL_TABLET | Freq: Every day | ORAL | Status: DC
Start: 2022-03-30 — End: 2022-03-30

## 2022-03-30 MED ORDER — ACETAMINOPHEN 325 MG PO TABS: 650.0000 mg | ORAL_TABLET | ORAL | Status: DC | PRN

## 2022-03-30 NOTE — Discharge Instructions (Addendum)
It was our pleasure to provide your ER care today - we hope that you feel better.  Avoid alcohol use as it is harmful to your physical health and mental well-being.   From the lab tests, your potassium level is low and liver tests elevated. For liver, avoid alcohol - follow up with primary care doctor.  As relates potassium, eat plenty of fruits and vegetables, take potassium supplement as prescribed, and follow up with your doctor in one week.   Follow up with primary care doctor and behavioral health provider in the coming week.   For mental health issues and/or crisis, you may also go directly to  the [facility] - it is open 24/7 and walk-ins are welcome. See resource guide provide for additional resources.  For anxiety, you may try take hydroxyzine as need, as prescribed - no driving when taking.  Return to ER if worse, new symptoms, fevers, trouble breathing, or other concern.

## 2022-03-30 NOTE — ED Notes (Signed)
Pts belongings returned.

## 2022-03-30 NOTE — ED Notes (Signed)
Pt found attempting to leave. Pt states, " I was told I could go home.". MD Roxanne Mins counseled pt and made pt aware that they could not leave until they were cleared by psych.

## 2022-03-30 NOTE — ED Provider Notes (Signed)
Emergency Medicine Observation Re-evaluation Note  [name] [name] is a 51 y.o. female, seen on rounds today.  Pt initially presented to the ED for complaints of episode of binge drinking and transient hallucinations. Pt denies any current hallucinations. Reports feeling much improved. Denies daily/heavy etoh use or etoh withdrawal. Denies severe depression or any thoughts of harm to self.   Physical Exam  BP (!) 144/84   Pulse 74   Temp 98.6 F (37 C) (Oral)   Resp 19   LMP 07/27/2019 (Approximate)   SpO2 96%  Physical Exam General: alert, content, conversant.  Cardiac: regular rate.  Lungs: breathing comfortably. Psych: pt w normal mood and affect. Does not appear acutely depressed or despondent. No SI/HI. Pt does acknowledge episodic life stresses, but denies feeling overwhelmed - and is currently very calm appearing. Pt is not responding to internal stimuli - no delusional thoughts or hallucinations are noted.   ED Course / MDM    I have reviewed the labs performed to date as well as medications administered while in observation.  Recent changes in the last 24 hours include ED obs, reassessment.   Plan    [name] [name] is not under involuntary commitment.  Patient reports feeling improved and ready for d/c.   On exam, mood/affect normal, and no acute psychosis.   Pt currently appears stable for d/c.   Rec outpatient pcp and bh f/u.  Return precautions provided.     Lajean Saver, MD 03/30/22 1252

## 2022-03-31 ENCOUNTER — Other Ambulatory Visit: Payer: Self-pay

## 2022-04-04 NOTE — Progress Notes (Signed)
New Patient Office Visit  Subjective    Patient ID: [name] [name], female    DOB: 1970-10-26  Age: 51 y.o. MRN: [id number]  CC: HTN PCP to establish  HPI [name] [name] presents to establish care This patient arrives here to establish care Patient's previously was seen in June to mobile medicine unit as documented below MMU OV 02/2022 [name] [name] presents for medication refills.  States that she has been taking blood pressure medication for the past 5 to 6 years.  States that she has been out of her medication for the past few months.  States that she does have a blood pressure cuff at home, and will start checking her blood pressure on a daily basis.  States that she has not been checking it previously, just received a blood pressure cuff.  States that she has been having intermittent sharp headaches since being out of her blood pressure medication.  States that she has been going to orthopedics for bulging disc which is caused her to have bilateral leg pain.  States that she takes amitriptyline and gabapentin as well as Norco.  States that she has not been able to follow-up with them due to financial constraints.  States that she has been having elevated anxiety, and a depressed mood.  States that she always has the feeling of "doom and gloom."  States that she is having difficulty falling asleep and staying asleep, has failed melatonin.  States that she was previously prescribed medication to help her with her depression, states she believes it was Lexapro, states that she did not take it for longer than 30 days, states that she did not notice any difference.  [name].    1. Essential hypertension Resume regimen.  Patient encouraged to check blood pressure at home on a daily basis, keep a written log and have available for all office visits.  Patient given appointment for fasting [facility].  Patient given application for [place] financial assistance, medications sent to community pharmacy at [facility] to help with financial constraints, patient given appointment to [facility] with Dr. Joya Gaskins on April 05, 2022.  Red flags given for prompt reevaluation. - olmesartan-hydrochlorothiazide (BENICAR HCT) 20-12.5 MG tablet; Take 1 tablet by mouth once daily.  Dispense: 30 tablet; Refill: 1 - CBC with Differential/Platelet; Future - Comp. Metabolic Panel (12); Future - Lipid panel; Future - TSH; Future   2. Hypertensive urgency Dose given in clinic, patient education given on potential side effects. - cloNIDine (CATAPRES) tablet 0.2 mg   3. GAD (generalized anxiety disorder) Trial Zoloft, hydroxyzine.  Patient education given on coping skills - hydrOXYzine (ATARAX) 25 MG tablet; Take 1 tablet (25 mg total) by mouth 3 (three) times daily as needed.  Dispense: 60 tablet; Refill: 1 - sertraline (ZOLOFT) 50 MG tablet; Take 1 tablet (50 mg total) by mouth once daily.  Dispense: 30 tablet; Refill: 3 - Vitamin D, 25-hydroxy; Future   4. Severe episode of recurrent major depressive disorder, without psychotic features ([place])     5. Psychophysiological insomnia Patient education given on good sleep hygiene   6. Pain in both lower extremities Continue current regimen - amitriptyline (ELAVIL) 50 MG tablet; Take 1 tablet (50 mg total) by mouth once nightly at bedtime.  Dispense: 30 tablet; Refill: 1 - gabapentin (NEURONTIN) 300 MG capsule; Take 1 capsule (300 mg total) by  mouth once nightly at bedtime.  Dispense: 30 capsule; Refill: 2   7. ESOPHAGEAL STRICTURE Continue current regimen - pantoprazole (PROTONIX) 40 MG tablet; Take 1 tablet (40 mg total) by mouth once daily.  Dispense: 30 tablet; Refill: 1   8. Encounter for HCV screening test for low risk patient   - HCV Ab w Reflex to Quant PCR; Future   9. Class 2 obesity due to  excess calories with body mass index (BMI) of 36.0 to 36.9 in adult, unspecified whether serious comorbidity present     Today the patient states she still having chronic low back pain and bilateral leg pain from lumbar radiculopathy.  She also has history of esophageal stricture with previous dilations.  Patient has history of hypertension as well on arrival elevated blood pressure noted 165/101.  Patient is on the Benicar HCT alone.  Patient also smokes on a daily basis 10 cigarettes daily.  Patient has been taking the atorvastatin daily along with gabapentin.  She does go to pain management but cannot afford to go back to them as she is uninsured.  She was giving Norco's and she understands and knows we do not give opiates at this clinic.  Patient is willing to get the orange card and [facility] discount.  Other labs at the last visit were unremarkable.  She is prediabetic.  Patient has significant anxiety and depression but no thoughts of suicidal ideation.  She is on low-dose Zoloft and hydroxyzine without much improvement in symptoms.  Patient has been taking the vitamin D as prescribed.  She takes Elavil in combination with gabapentin for neuropathic pain.  She also takes ibuprofen as well.  Patient has a prescription potassium she is yet to start with potassium 3.1 at the mobile medicine unit previously seen.  Outpatient Encounter Medications as of 04/05/2022  Medication Sig   amLODipine (NORVASC) 10 MG tablet Take 1 tablet (10 mg total) by mouth daily.   hydrOXYzine (ATARAX) 25 MG tablet Take 1 tablet (25 mg total) by mouth every 6 (six) hours.   ibuprofen (ADVIL) 200 MG tablet Take 800 mg by mouth daily as needed for headache.   pantoprazole (PROTONIX) 40 MG tablet Take 1 tablet (40 mg total) by mouth once daily.   potassium chloride SA (KLOR-CON M) 20 MEQ tablet Take 1 tablet (20 mEq total) by mouth daily.   valsartan-hydrochlorothiazide (DIOVAN-HCT) 160-25 MG tablet Take 1 tablet by  mouth daily.   [DISCONTINUED] amitriptyline (ELAVIL) 50 MG tablet Take 1 tablet (50 mg total) by mouth once nightly at bedtime.   [DISCONTINUED] atorvastatin (LIPITOR) 20 MG tablet Take 1 tablet (20 mg total) by mouth daily.   [DISCONTINUED] gabapentin (NEURONTIN) 300 MG capsule Take 1 capsule (300 mg total) by mouth once nightly at bedtime.   [DISCONTINUED] hydrOXYzine (ATARAX) 25 MG tablet Take 1 tablet (25 mg total) by mouth 3 (three) times daily as needed. (Patient taking differently: Take 25 mg by mouth 3 (three) times daily as needed for anxiety.)   [DISCONTINUED] olmesartan-hydrochlorothiazide (BENICAR HCT) 20-12.5 MG tablet Take 1 tablet by mouth once daily.   [DISCONTINUED] sertraline (ZOLOFT) 50 MG tablet Take 1 tablet (50 mg total) by mouth once daily.   [DISCONTINUED] Vitamin D, Ergocalciferol, (DRISDOL) 1.25 MG (50000 UNIT) CAPS capsule Take 1 capsule (50,000 Units total) by mouth every 7 (seven) days. (Patient taking differently: Take 50,000 Units by mouth every 7 (seven) days. Thursday)   amitriptyline (ELAVIL) 50 MG tablet Take 1 tablet (50 mg total) by  mouth once nightly at bedtime.   atorvastatin (LIPITOR) 20 MG tablet Take 1 tablet (20 mg total) by mouth daily.   gabapentin (NEURONTIN) 300 MG capsule Take 1 capsule (300 mg total) by mouth once nightly at bedtime.   sertraline (ZOLOFT) 100 MG tablet Take 1 tablet (100 mg total) by mouth daily.   Vitamin D, Ergocalciferol, (DRISDOL) 1.25 MG (50000 UNIT) CAPS capsule Take 1 capsule (50,000 Units total) by mouth every 7 (seven) days.   [DISCONTINUED] HYDROcodone-acetaminophen (NORCO/VICODIN) 5-325 MG tablet Take 1 tablet by mouth every 6 (six) hours as needed for severe pain. (Patient not taking: Reported on 03/30/2022)   [DISCONTINUED] cloNIDine (CATAPRES) tablet 0.2 mg    No facility-administered encounter medications on file as of 04/05/2022.    Past Medical History:  Diagnosis Date   Anxiety    history - no meds   Arthritis     bulging disc L4/5  L5/S1   Chronic back pain    Cystitis 07/2008   under care of [facility]   Depression    history - no meds   GERD (gastroesophageal reflux disease)    H/O hiatal hernia    no meds - diet controlled   Hepatitis    History Hep A at age 30 yrs - no prev problems   Hyperlipidemia    Hypertension    Insomnia    Macular degeneration 2013   Age related per pt - no meds   Pain, dental 2010   history - teeth ext per patient   Plantar fasciitis    s/p left gastroc slide    S/P dilatation of esophageal stricture 2006-2008    Past Surgical History:  Procedure Laterality Date   CESAREAN SECTION     1991   CHOLECYSTECTOMY  1991   COLONOSCOPY     dilatation of esophageal stricture  2005-2008   DILATION AND EVACUATION  02/09/2012   Procedure: DILATATION AND EVACUATION;  Surgeon: Osborne Oman, MD;  Location: [facility];  Service: Gynecology;  Laterality: N/A;  With ultrasound guidance   DILATION AND EVACUATION N/A 08/29/2014   Procedure: DILATATION AND EVACUATION (D&E) 2ND TRIMESTER;  Surgeon: Mora Bellman, MD;  Location: [facility];  Service: Gynecology;  Laterality: N/A;   ESOPHAGOGASTRODUODENOSCOPY N/A 09/30/2017   Procedure: ESOPHAGOGASTRODUODENOSCOPY (EGD);  Surgeon: Wonda Horner, MD;  Location: [facility] ENDOSCOPY;  Service: Endoscopy;  Laterality: N/A;   gastroc slide  2005   left side    Pancystourethroscopy     plantar fasciitis     left foot   SVD  1999   x 1    Family History  Problem Relation Age of Onset   Diabetes Mother    Hyperlipidemia Mother    Stroke Mother    Hypertension Mother    Arthritis Mother    Heart disease Father    Hypertension Father    Hypertension Brother     Social History   Socioeconomic History   Marital status: Single    Spouse name: Not on file   Number of children: Not on file   Years of education: Not on file   Highest education level: Not on file  Occupational History   Not on file  Tobacco Use   Smoking  status: Every Day    Packs/day: 0.25    Years: 8.00    Total pack years: 2.00    Types: Cigarettes   Smokeless tobacco: Never   Tobacco comments:    4-5 cigs/day  Substance and Sexual  Activity   Alcohol use: Yes    Comment: rarely   Drug use: No   Sexual activity: Yes    Birth control/protection: None  Other Topics Concern   Not on file  Social History Narrative   Current smoker 1/2 PPD   Denies alcohol use   2 children    Social Determinants of Radio broadcast assistant Strain: Not on file  Food Insecurity: Not on file  Transportation Needs: Not on file  Physical Activity: Not on file  Stress: Not on file  Social Connections: Not on file  Intimate Partner Violence: Not on file    Review of Systems  Constitutional:  Negative for chills, diaphoresis, fever, malaise/fatigue and weight loss.  HENT:  Negative for congestion, hearing loss, nosebleeds, sore throat and tinnitus.   Eyes:  Negative for blurred vision, photophobia and redness.  Respiratory:  Negative for cough, hemoptysis, sputum production, shortness of breath, wheezing and stridor.   Cardiovascular:  Negative for chest pain, palpitations, orthopnea, claudication, leg swelling and PND.  Gastrointestinal:  Negative for abdominal pain, blood in stool, constipation, diarrhea, heartburn, nausea and vomiting.  Genitourinary:  Negative for dysuria, flank pain, frequency, hematuria and urgency.  Musculoskeletal:  Positive for back pain. Negative for falls, joint pain, myalgias and neck pain.  Skin:  Negative for itching and rash.  Neurological:  Positive for tingling and weakness. Negative for dizziness, tremors, sensory change, speech change, focal weakness, seizures, loss of consciousness and headaches.  Endo/Heme/Allergies:  Negative for environmental allergies and polydipsia. Does not bruise/bleed easily.  Psychiatric/Behavioral:  Positive for depression. Negative for memory loss, substance abuse and suicidal ideas.  The patient is nervous/anxious and has insomnia.         Objective    BP (!) 165/101   Pulse (!) 103   Ht 5' (1.524 m)   Wt 193 lb 12.8 oz (87.9 kg)   LMP 07/27/2019 (Approximate)   SpO2 95%   BMI 37.85 kg/m   Physical Exam Vitals reviewed.  Constitutional:      Appearance: Normal appearance. She is well-developed. She is obese. She is not diaphoretic.  HENT:     Head: Normocephalic and atraumatic.     Nose: No nasal deformity, septal deviation, mucosal edema or rhinorrhea.     Right Sinus: No maxillary sinus tenderness or frontal sinus tenderness.     Left Sinus: No maxillary sinus tenderness or frontal sinus tenderness.     Mouth/Throat:     Pharynx: No oropharyngeal exudate.  Eyes:     General: No scleral icterus.    Conjunctiva/sclera: Conjunctivae normal.     Pupils: Pupils are equal, round, and reactive to light.  Neck:     Thyroid: No thyromegaly.     Vascular: No carotid bruit or JVD.     Trachea: Trachea normal. No tracheal tenderness or tracheal deviation.  Cardiovascular:     Rate and Rhythm: Normal rate and regular rhythm.     Chest Wall: PMI is not displaced.     Pulses: Normal pulses. No decreased pulses.     Heart sounds: Normal heart sounds, S1 normal and S2 normal. Heart sounds not distant. No murmur heard.    No systolic murmur is present.     No diastolic murmur is present.     No friction rub. No gallop. No S3 or S4 sounds.  Pulmonary:     Effort: No tachypnea, accessory muscle usage or respiratory distress.     Breath sounds: No stridor.  No decreased breath sounds, wheezing, rhonchi or rales.  Chest:     Chest wall: No tenderness.  Abdominal:     General: Bowel sounds are normal. There is no distension.     Palpations: Abdomen is soft. Abdomen is not rigid.     Tenderness: There is no abdominal tenderness. There is no guarding or rebound.  Musculoskeletal:        General: Tenderness present. Normal range of motion.     Cervical back: Normal  range of motion and neck supple. No edema, erythema or rigidity. No muscular tenderness. Normal range of motion.     Comments: Tender in the low back area  Lymphadenopathy:     Head:     Right side of head: No submental or submandibular adenopathy.     Left side of head: No submental or submandibular adenopathy.     Cervical: No cervical adenopathy.  Skin:    General: Skin is warm and dry.     Coloration: Skin is not pale.     Findings: No rash.     Nails: There is no clubbing.  Neurological:     Mental Status: She is alert and oriented to person, place, and time.     Sensory: No sensory deficit.  Psychiatric:        Attention and Perception: Attention and perception normal.        Mood and Affect: Mood is depressed.        Speech: Speech normal.        Behavior: Behavior normal. Behavior is cooperative.        Thought Content: Thought content normal. Thought content does not include homicidal or suicidal ideation.        Cognition and Memory: Cognition and memory normal.        Judgment: Judgment normal.         Assessment & Plan:   Problem List Items Addressed This Visit       Cardiovascular and Mediastinum   Essential hypertension - Primary    Hypertension not well controlled plan to switch Benicar HCT to valsartan HCT 160/25 and to begin amlodipine 10 mg daily  Patient be brought back for short-term follow-up      Relevant Medications   amLODipine (NORVASC) 10 MG tablet   valsartan-hydrochlorothiazide (DIOVAN-HCT) 160-25 MG tablet   atorvastatin (LIPITOR) 20 MG tablet     Digestive   ESOPHAGEAL STRICTURE    Patient needs repeat endoscopy with dilation she will get the orange card when she achieves this we will make a referral to gastroenterology        Nervous and Auditory   Lumbar radiculopathy    Plan to refer this patient to orthopedic spine when she gets the orange card but in the interim I will make a referral to physical therapy      Relevant  Medications   sertraline (ZOLOFT) 100 MG tablet   amitriptyline (ELAVIL) 50 MG tablet   gabapentin (NEURONTIN) 300 MG capsule   Other Relevant Orders   Ambulatory referral to Physical Therapy     Other   GAD (generalized anxiety disorder)    Continue hydroxyzine as needed      Relevant Medications   sertraline (ZOLOFT) 100 MG tablet   amitriptyline (ELAVIL) 50 MG tablet   TOBACCO ABUSE       Current smoking consumption amount: 10 cigarettes daily  Dicsussion on advise to quit smoking and smoking impacts: Cardiovascular impacts  Patient's willingness to quit: Wants  to quit  Methods to quit smoking discussed: Behavioral modification  Medication management of smoking session drugs discussed: Nicotine replacement  Resources provided:  AVS   Setting quit date not established Follow-up arranged 1 month   Time spent counseling the patient: 5 minutes      LEG PAIN, BILATERAL    Related to lumbar radiculopathy will continue amitriptyline and gabapentin      Relevant Medications   amitriptyline (ELAVIL) 50 MG tablet   gabapentin (NEURONTIN) 300 MG capsule   Other Relevant Orders   Ambulatory referral to Physical Therapy   Severe episode of recurrent major depressive disorder, without psychotic features ([place])    Plan to increase Zoloft to 100 mg daily and make referral to licensed clinical social worker [place]      Relevant Medications   sertraline (ZOLOFT) 100 MG tablet   amitriptyline (ELAVIL) 50 MG tablet   Other Visit Diagnoses     Familial hypercholesterolemia       Relevant Medications   amLODipine (NORVASC) 10 MG tablet   valsartan-hydrochlorothiazide (DIOVAN-HCT) 160-25 MG tablet   atorvastatin (LIPITOR) 20 MG tablet   Vitamin D deficiency       Relevant Medications   Vitamin D, Ergocalciferol, (DRISDOL) 1.25 MG (50000 UNIT) CAPS capsule   Colon cancer screening       Relevant Orders   Fecal occult blood, imunochemical    Encounter for screening mammogram for malignant neoplasm of breast       Relevant Orders   MM DIGITAL SCREENING BILATERAL     48 minutes spent complex decision making high multiple systems assessed Issued patient with colon cancer screening kit this visit Ordered Pap smear and mammogram via cancer control program Return in about 1 month (around 05/06/2022) for htn.   Asencion Noble, MD

## 2022-04-05 ENCOUNTER — Other Ambulatory Visit: Payer: Self-pay

## 2022-04-05 ENCOUNTER — Encounter: Payer: Self-pay | Admitting: Critical Care Medicine

## 2022-04-05 ENCOUNTER — Telehealth: Payer: Self-pay | Admitting: Critical Care Medicine

## 2022-04-05 ENCOUNTER — Ambulatory Visit: Payer: Medicaid Other | Attending: Critical Care Medicine | Admitting: Critical Care Medicine

## 2022-04-05 VITALS — BP 165/101 | HR 103 | Ht 60.0 in | Wt 193.8 lb

## 2022-04-05 DIAGNOSIS — K222 Esophageal obstruction: Secondary | ICD-10-CM

## 2022-04-05 DIAGNOSIS — Z1231 Encounter for screening mammogram for malignant neoplasm of breast: Secondary | ICD-10-CM

## 2022-04-05 DIAGNOSIS — E7801 Familial hypercholesterolemia: Secondary | ICD-10-CM

## 2022-04-05 DIAGNOSIS — M5416 Radiculopathy, lumbar region: Secondary | ICD-10-CM

## 2022-04-05 DIAGNOSIS — E78019 Familial hypercholesterolemia, unspecified: Secondary | ICD-10-CM

## 2022-04-05 DIAGNOSIS — Z6837 Body mass index (BMI) 37.0-37.9, adult: Secondary | ICD-10-CM

## 2022-04-05 DIAGNOSIS — E559 Vitamin D deficiency, unspecified: Secondary | ICD-10-CM

## 2022-04-05 DIAGNOSIS — I10 Essential (primary) hypertension: Secondary | ICD-10-CM

## 2022-04-05 DIAGNOSIS — M79605 Pain in left leg: Secondary | ICD-10-CM

## 2022-04-05 DIAGNOSIS — M79604 Pain in right leg: Secondary | ICD-10-CM

## 2022-04-05 DIAGNOSIS — F17210 Nicotine dependence, cigarettes, uncomplicated: Secondary | ICD-10-CM

## 2022-04-05 DIAGNOSIS — F332 Major depressive disorder, recurrent severe without psychotic features: Secondary | ICD-10-CM

## 2022-04-05 DIAGNOSIS — F411 Generalized anxiety disorder: Secondary | ICD-10-CM

## 2022-04-05 DIAGNOSIS — Z1211 Encounter for screening for malignant neoplasm of colon: Secondary | ICD-10-CM

## 2022-04-05 DIAGNOSIS — F17200 Nicotine dependence, unspecified, uncomplicated: Secondary | ICD-10-CM

## 2022-04-05 MED ORDER — ATORVASTATIN CALCIUM 20 MG PO TABS
20.0000 mg | ORAL_TABLET | Freq: Every day | ORAL | 2 refills | Status: DC
  Filled 2022-04-05: qty 30, 30d supply, fill #0
  Filled 2022-04-30 (×2): qty 30, 30d supply, fill #1

## 2022-04-05 MED ORDER — SERTRALINE HCL 100 MG PO TABS
100.0000 mg | ORAL_TABLET | Freq: Every day | ORAL | 4 refills | Status: DC
  Filled 2022-04-05: qty 30, 30d supply, fill #0
  Filled 2022-04-30 (×2): qty 30, 30d supply, fill #1

## 2022-04-05 MED ORDER — AMITRIPTYLINE HCL 50 MG PO TABS
50.0000 mg | ORAL_TABLET | Freq: Every day | ORAL | 1 refills | Status: DC
  Filled 2022-04-05 – 2022-04-21 (×2): qty 30, 30d supply, fill #0

## 2022-04-05 MED ORDER — GABAPENTIN 300 MG PO CAPS
300.0000 mg | ORAL_CAPSULE | Freq: Every day | ORAL | 2 refills | Status: DC
  Filled 2022-04-05 – 2022-04-21 (×2): qty 30, 30d supply, fill #0

## 2022-04-05 MED ORDER — AMLODIPINE BESYLATE 10 MG PO TABS
10.0000 mg | ORAL_TABLET | Freq: Every day | ORAL | 3 refills | Status: DC
  Filled 2022-04-05: qty 30, 30d supply, fill #0
  Filled 2022-04-30 (×2): qty 30, 30d supply, fill #1
  Filled 2022-05-31 – 2022-06-03 (×2): qty 30, 30d supply, fill #2
  Filled 2022-06-29 – 2022-06-30 (×2): qty 30, 30d supply, fill #3
  Filled 2022-08-09 – 2022-08-16 (×3): qty 30, 30d supply, fill #4
  Filled 2022-09-08: qty 30, 30d supply, fill #5
  Filled 2022-10-08: qty 30, 30d supply, fill #6
  Filled 2022-11-09: qty 30, 30d supply, fill #7

## 2022-04-05 MED ORDER — VALSARTAN-HYDROCHLOROTHIAZIDE 160-25 MG PO TABS
1.0000 | ORAL_TABLET | Freq: Every day | ORAL | 3 refills | Status: DC
  Filled 2022-04-05: qty 30, 30d supply, fill #0
  Filled 2022-04-30 (×2): qty 30, 30d supply, fill #1

## 2022-04-05 MED ORDER — VITAMIN D (ERGOCALCIFEROL) 1.25 MG (50000 UNIT) PO CAPS
50000.0000 [IU] | ORAL_CAPSULE | ORAL | 2 refills | Status: DC
  Filled 2022-04-05 – 2022-04-21 (×2): qty 4, 28d supply, fill #0

## 2022-04-05 NOTE — Assessment & Plan Note (Signed)
  .   Current smoking consumption amount: 10 cigarettes daily  . Dicsussion on advise to quit smoking and smoking impacts: Cardiovascular impacts  . Patient's willingness to quit: Wants to quit  . Methods to quit smoking discussed: Behavioral modification  . Medication management of smoking session drugs discussed: Nicotine replacement  . Resources provided:  AVS   Setting quit date not established . Follow-up arranged 1 month   Time spent counseling the patient: 5 minutes

## 2022-04-05 NOTE — Assessment & Plan Note (Signed)
Continue hydroxyzine as needed 

## 2022-04-05 NOTE — Assessment & Plan Note (Signed)
Patient needs repeat endoscopy with dilation she will get the orange card when she achieves this we will make a referral to gastroenterology

## 2022-04-05 NOTE — Patient Instructions (Signed)
[facility] [address], [place], [place] [address] [phone] or [phone] Walk-in urgent care 24/7 for anyone  For [facility] ONLY New patient assessments and therapy walk-ins: Monday and Wednesday 8am-11am First and second Friday 1pm-5pm New patient psychiatry and medication management walk-ins: Mondays, Wednesdays, Thursdays, Fridays 8am-11am No psychiatry walk-ins on Tuesday    Begin amlodipine 1 daily for blood pressure  Change Benicar HCT to valsartan HCT 1 daily for blood pressure reduction  A Pap smear and mammogram will be obtained through the cancer control program we will have you sign the scholarship form for this  Physical therapy consult be obtained for your back  Reduce further nicotine use use nicotine lozenge 4 mg 3 times daily prescription sent to our pharmacy downstairs however this may be over-the-counter and you may have to pay for this cash  Please pick up a [place] discount and orange card application our front desk at checkout so that you can apply for this this will allow [place] to get your gastroenterology for esophageal dilations and any other specialty services needed  Our clinical social worker Ms. [place] Crane will be in touch with you for counseling  The [facility] is available for medication management of your medical conditions strongly recommend you use one of the walk-in dates to be seen they can make additional adjustments in your mental health medications that we have started with in this clinic  Return to see Dr. Joya Gaskins in 1 month for blood pressure reevaluations  Follow the lifestyle medicine handout recommendations particular as it relates to diet

## 2022-04-05 NOTE — Telephone Encounter (Signed)
This patient has severe anxiety and depression I have her on high-dose sertraline and also on hydroxyzine, she is not suicidal, I gave her information to go to [facility] behavioral health she is uninsured  Please see her for severe anxiety depression for counseling and as well make sure she gets to go for [employer] [facility] and is well help her with smoking cessation counseling

## 2022-04-05 NOTE — Assessment & Plan Note (Signed)
Related to lumbar radiculopathy will continue amitriptyline and gabapentin

## 2022-04-05 NOTE — Assessment & Plan Note (Signed)
Plan to increase Zoloft to 100 mg daily and make referral to licensed clinical social worker [facility]

## 2022-04-05 NOTE — Assessment & Plan Note (Signed)
Plan to refer this patient to orthopedic spine when she gets the orange card but in the interim I will make a referral to physical therapy

## 2022-04-05 NOTE — Assessment & Plan Note (Signed)
Hypertension not well controlled plan to switch Benicar HCT to valsartan HCT 160/25 and to begin amlodipine 10 mg daily  Patient be brought back for short-term follow-up

## 2022-04-08 ENCOUNTER — Other Ambulatory Visit: Payer: Self-pay | Admitting: Physician Assistant

## 2022-04-08 ENCOUNTER — Other Ambulatory Visit: Payer: Self-pay | Admitting: Critical Care Medicine

## 2022-04-08 ENCOUNTER — Other Ambulatory Visit: Payer: Self-pay

## 2022-04-08 ENCOUNTER — Other Ambulatory Visit: Payer: Self-pay | Admitting: Emergency Medicine

## 2022-04-08 DIAGNOSIS — K222 Esophageal obstruction: Secondary | ICD-10-CM

## 2022-04-08 MED ORDER — PANTOPRAZOLE SODIUM 40 MG PO TBEC
40.0000 mg | DELAYED_RELEASE_TABLET | Freq: Every day | ORAL | 1 refills | Status: DC
  Filled 2022-04-08 – 2022-04-21 (×2): qty 30, 30d supply, fill #0

## 2022-04-09 ENCOUNTER — Other Ambulatory Visit: Payer: Self-pay

## 2022-04-09 LAB — FECAL OCCULT BLOOD, IMMUNOCHEMICAL: Fecal Occult Bld: NEGATIVE

## 2022-04-11 NOTE — Progress Notes (Signed)
Let pt know fecal occult neg for colon CA recheck one year

## 2022-04-12 ENCOUNTER — Telehealth: Payer: Self-pay

## 2022-04-12 ENCOUNTER — Other Ambulatory Visit: Payer: Self-pay

## 2022-04-12 NOTE — Telephone Encounter (Signed)
-----   Message from Elsie Stain, MD sent at 04/11/2022  7:57 AM EDT ----- Let pt know fecal occult neg for colon CA recheck one year

## 2022-04-12 NOTE — Telephone Encounter (Signed)
Pt was called and vm was left, Information has been sent to nurse pool.   

## 2022-04-16 NOTE — Telephone Encounter (Signed)
This patient has severe anxiety and depression and was referred to me by PCP. LCSWA called pt ant left a detailed messaged introducing herself and asking that she gives [facility] a call back. LCSWA would like to follow up with her [facility] referral as well as offer her counseling for her anxiety and depression and link her with [place] behavioral health and help her with smoking cessation counseling. LCSWA will try to meet pt at her next visit with PCP on 8/21 '@2'$ :30.

## 2022-04-21 ENCOUNTER — Other Ambulatory Visit: Payer: Self-pay | Admitting: Emergency Medicine

## 2022-04-21 ENCOUNTER — Other Ambulatory Visit: Payer: Self-pay

## 2022-04-21 ENCOUNTER — Other Ambulatory Visit (HOSPITAL_COMMUNITY): Payer: Self-pay

## 2022-04-21 NOTE — Progress Notes (Signed)
Patient appearing on report for [employer] - Hypertension Control report due to last documented ambulatory blood pressure of 165/101 mmHg on 04/05/2022. Next appointment with PCP is 05/03/2022.   Outreached patient to discuss hypertension control and medication management. Unable to reach patient.   Joseph Art, Pharm.D. PGY-2 Ambulatory Care Pharmacy Resident 04/21/2022 12:26 PM

## 2022-04-22 ENCOUNTER — Other Ambulatory Visit: Payer: Self-pay

## 2022-04-30 ENCOUNTER — Other Ambulatory Visit (HOSPITAL_COMMUNITY): Payer: Self-pay

## 2022-04-30 ENCOUNTER — Other Ambulatory Visit: Payer: Self-pay

## 2022-05-02 NOTE — Progress Notes (Signed)
New Patient Office Visit  Subjective    Patient ID: [name] [name], female    DOB: 25-Nov-1970  Age: 51 y.o. MRN: [id number]  CC: HTN PCP to establish  HPI [name] [name] presents to establish care This patient arrives here to establish care Patient's previously was seen in June to mobile medicine unit as documented below MMU OV 02/2022 [name] [name] presents for medication refills.  States that she has been taking blood pressure medication for the past 5 to 6 years.  States that she has been out of her medication for the past few months.  States that she does have a blood pressure cuff at home, and will start checking her blood pressure on a daily basis.  States that she has not been checking it previously, just received a blood pressure cuff.  States that she has been having intermittent sharp headaches since being out of her blood pressure medication.  States that she has been going to orthopedics for bulging disc which is caused her to have bilateral leg pain.  States that she takes amitriptyline and gabapentin as well as Norco.  States that she has not been able to follow-up with them due to financial constraints.  States that she has been having elevated anxiety, and a depressed mood.  States that she always has the feeling of "doom and gloom."  States that she is having difficulty falling asleep and staying asleep, has failed melatonin.  States that she was previously prescribed medication to help her with her depression, states she believes it was Lexapro, states that she did not take it for longer than 30 days, states that she did not notice any difference.  [name].    1. Essential hypertension Resume regimen.  Patient encouraged to check blood pressure at home on a daily basis, keep a written log and have available for all office visits.  Patient given appointment [facility].  Patient given application for [place] financial assistance, medications sent to community pharmacy at [facility] to help with financial constraints, patient given appointment [facility] with Dr. Joya Gaskins on April 05, 2022.  Red flags given for prompt reevaluation. - olmesartan-hydrochlorothiazide (BENICAR HCT) 20-12.5 MG tablet; Take 1 tablet by mouth once daily.  Dispense: 30 tablet; Refill: 1 - CBC with Differential/Platelet; Future - Comp. Metabolic Panel (12); Future - Lipid panel; Future - TSH; Future   2. Hypertensive urgency Dose given in clinic, patient education given on potential side effects. - cloNIDine (CATAPRES) tablet 0.2 mg   3. GAD (generalized anxiety disorder) Trial Zoloft, hydroxyzine.  Patient education given on coping skills - hydrOXYzine (ATARAX) 25 MG tablet; Take 1 tablet (25 mg total) by mouth 3 (three) times daily as needed.  Dispense: 60 tablet; Refill: 1 - sertraline (ZOLOFT) 50 MG tablet; Take 1 tablet (50 mg total) by mouth once daily.  Dispense: 30 tablet; Refill: 3 - Vitamin D, 25-hydroxy; Future   4. Severe episode of recurrent major depressive disorder, without psychotic features ([place])     5. Psychophysiological insomnia Patient education given on good sleep hygiene   6. Pain in both lower extremities Continue current regimen - amitriptyline (ELAVIL) 50 MG tablet; Take 1 tablet (50 mg total) by mouth once nightly at bedtime.  Dispense: 30 tablet; Refill: 1 - gabapentin (NEURONTIN) 300 MG capsule; Take 1 capsule (300 mg total) by  mouth once nightly at bedtime.  Dispense: 30 capsule; Refill: 2   7. ESOPHAGEAL STRICTURE Continue current regimen - pantoprazole (PROTONIX) 40 MG tablet; Take 1 tablet (40 mg total) by mouth once daily.  Dispense: 30 tablet; Refill: 1   8. Encounter for HCV screening test for low risk patient   - HCV Ab w Reflex to Quant PCR; Future   9. Class 2 obesity due to  excess calories with body mass index (BMI) of 36.0 to 36.9 in adult, unspecified whether serious comorbidity present     Today the patient states she still having chronic low back pain and bilateral leg pain from lumbar radiculopathy.  She also has history of esophageal stricture with previous dilations.  Patient has history of hypertension as well on arrival elevated blood pressure noted 165/101.  Patient is on the Benicar HCT alone.  Patient also smokes on a daily basis 10 cigarettes daily.  Patient has been taking the atorvastatin daily along with gabapentin.  She does go to pain management but cannot afford to go back to them as she is uninsured.  She was giving Norco's and she understands and knows we do not give opiates at this clinic.  Patient is willing to get the orange card and [place] discount.  Other labs at the last visit were unremarkable.  She is prediabetic.  Patient has significant anxiety and depression but no thoughts of suicidal ideation.  She is on low-dose Zoloft and hydroxyzine without much improvement in symptoms.  Patient has been taking the vitamin D as prescribed.  She takes Elavil in combination with gabapentin for neuropathic pain.  She also takes ibuprofen as well.  Patient has a prescription potassium she is yet to start with potassium 3.1 at the mobile medicine unit previously seen.  8/21    Cardiovascular and Mediastinum   Essential hypertension - Primary    Hypertension not well controlled plan to switch Benicar HCT to valsartan HCT 160/25 and to begin amlodipine 10 mg daily  Patient be brought back for short-term follow-up      Relevant Medications   amLODipine (NORVASC) 10 MG tablet   valsartan-hydrochlorothiazide (DIOVAN-HCT) 160-25 MG tablet   atorvastatin (LIPITOR) 20 MG tablet     Digestive   ESOPHAGEAL STRICTURE    Patient needs repeat endoscopy with dilation she will get the orange card when she achieves this we will make a referral to  gastroenterology        Nervous and Auditory   Lumbar radiculopathy    Plan to refer this patient to orthopedic spine when she gets the orange card but in the interim I will make a referral to physical therapy      Relevant Medications   sertraline (ZOLOFT) 100 MG tablet   amitriptyline (ELAVIL) 50 MG tablet   gabapentin (NEURONTIN) 300 MG capsule   Other Relevant Orders   Ambulatory referral to Physical Therapy     Other   GAD (generalized anxiety disorder)    Continue hydroxyzine as needed      Relevant Medications   sertraline (ZOLOFT) 100 MG tablet   amitriptyline (ELAVIL) 50 MG tablet   TOBACCO ABUSE       Current smoking consumption amount: 10 cigarettes daily  Dicsussion on advise to quit smoking and smoking impacts: Cardiovascular impacts  Patient's willingness to quit: Wants to quit  Methods to quit smoking discussed: Behavioral modification  Medication management of smoking session drugs discussed: Nicotine replacement  Resources provided:  AVS  Setting quit date not established Follow-up arranged 1 month   Time spent counseling the patient: 5 minutes      LEG PAIN, BILATERAL    Related to lumbar radiculopathy will continue amitriptyline and gabapentin      Relevant Medications   amitriptyline (ELAVIL) 50 MG tablet   gabapentin (NEURONTIN) 300 MG capsule   Other Relevant Orders   Ambulatory referral to Physical Therapy   Severe episode of recurrent major depressive disorder, without psychotic features ([place])    Plan to increase Zoloft to 100 mg daily and make referral to licensed clinical social worker [place]      Relevant Medications   sertraline (ZOLOFT) 100 MG tablet   amitriptyline (ELAVIL) 50 MG tablet   Other Visit Diagnoses     Familial hypercholesterolemia       Relevant Medications   amLODipine (NORVASC) 10 MG tablet   valsartan-hydrochlorothiazide (DIOVAN-HCT) 160-25 MG tablet   atorvastatin  (LIPITOR) 20 MG tablet   Vitamin D deficiency       Relevant Medications   Vitamin D, Ergocalciferol, (DRISDOL) 1.25 MG (50000 UNIT) CAPS capsule   Colon cancer screening       Relevant Orders   Fecal occult blood, imunochemical   Encounter for screening mammogram for malignant neoplasm of breast       Relevant Orders   MM DIGITAL SCREENING BILATERAL     48 minutes spent complex decision making high multiple systems assessed Issued patient with colon cancer screening kit this visit Ordered Pap smear and mammogram via cancer control program   Outpatient Encounter Medications as of 05/03/2022  Medication Sig  . amitriptyline (ELAVIL) 50 MG tablet Take 1 tablet (50 mg total) by mouth once nightly at bedtime.  Marland Kitchen amLODipine (NORVASC) 10 MG tablet Take 1 tablet (10 mg total) by mouth daily.  Marland Kitchen atorvastatin (LIPITOR) 20 MG tablet Take 1 tablet (20 mg total) by mouth daily.  Marland Kitchen gabapentin (NEURONTIN) 300 MG capsule Take 1 capsule (300 mg total) by mouth once nightly at bedtime.  . hydrOXYzine (ATARAX) 25 MG tablet Take 1 tablet (25 mg total) by mouth every 6 (six) hours.  Marland Kitchen ibuprofen (ADVIL) 200 MG tablet Take 800 mg by mouth daily as needed for headache.  . pantoprazole (PROTONIX) 40 MG tablet Take 1 tablet (40 mg total) by mouth once daily.  . potassium chloride SA (KLOR-CON M) 20 MEQ tablet Take 1 tablet (20 mEq total) by mouth daily.  . sertraline (ZOLOFT) 100 MG tablet Take 1 tablet (100 mg total) by mouth daily.  . valsartan-hydrochlorothiazide (DIOVAN-HCT) 160-25 MG tablet Take 1 tablet by mouth daily.  . Vitamin D, Ergocalciferol, (DRISDOL) 1.25 MG (50000 UNIT) CAPS capsule Take 1 capsule (50,000 Units total) by mouth every 7 (seven) days.   No facility-administered encounter medications on file as of 05/03/2022.    Past Medical History:  Diagnosis Date  . Anxiety    history - no meds  . Arthritis    bulging disc L4/5  L5/S1  . Chronic back pain   . Cystitis 07/2008   under  care of [facility]  . Depression    history - no meds  . GERD (gastroesophageal reflux disease)   . H/O hiatal hernia    no meds - diet controlled  . Hepatitis    History Hep A at age 79 yrs - no prev problems  . Hyperlipidemia   . Hypertension   . Insomnia   . Macular degeneration 2013  Age related per pt - no meds  . Pain, dental 2010   history - teeth ext per patient  . Plantar fasciitis    s/p left gastroc slide   . S/P dilatation of esophageal stricture 2006-2008    Past Surgical History:  Procedure Laterality Date  . CESAREAN SECTION     1991  . CHOLECYSTECTOMY  1991  . COLONOSCOPY    . dilatation of esophageal stricture  2005-2008  . DILATION AND EVACUATION  02/09/2012   Procedure: DILATATION AND EVACUATION;  Surgeon: Osborne Oman, MD;  Location: [facility];  Service: Gynecology;  Laterality: N/A;  With ultrasound guidance  . DILATION AND EVACUATION N/A 08/29/2014   Procedure: DILATATION AND EVACUATION (D&E) 2ND TRIMESTER;  Surgeon: Mora Bellman, MD;  Location: [facility];  Service: Gynecology;  Laterality: N/A;  . ESOPHAGOGASTRODUODENOSCOPY N/A 09/30/2017   Procedure: ESOPHAGOGASTRODUODENOSCOPY (EGD);  Surgeon: Wonda Horner, MD;  Location: [facility];  Service: Endoscopy;  Laterality: N/A;  . gastroc slide  2005   left side   . Pancystourethroscopy    . plantar fasciitis     left foot  . SVD  1999   x 1    Family History  Problem Relation Age of Onset  . Diabetes Mother   . Hyperlipidemia Mother   . Stroke Mother   . Hypertension Mother   . Arthritis Mother   . Heart disease Father   . Hypertension Father   . Hypertension Brother     Social History   Socioeconomic History  . Marital status: Single    Spouse name: Not on file  . Number of children: Not on file  . Years of education: Not on file  . Highest education level: Not on file  Occupational History  . Not on file  Tobacco Use  . Smoking status: Every Day    Packs/day: 0.25     Years: 8.00    Total pack years: 2.00    Types: Cigarettes  . Smokeless tobacco: Never  . Tobacco comments:    4-5 cigs/day  Substance and Sexual Activity  . Alcohol use: Yes    Comment: rarely  . Drug use: No  . Sexual activity: Yes    Birth control/protection: None  Other Topics Concern  . Not on file  Social History Narrative   Current smoker 1/2 PPD   Denies alcohol use   2 children    Social Determinants of Radio broadcast assistant Strain: Not on file  Food Insecurity: Not on file  Transportation Needs: Not on file  Physical Activity: Not on file  Stress: Not on file  Social Connections: Not on file  Intimate Partner Violence: Not on file    Review of Systems  Constitutional:  Negative for chills, diaphoresis, fever, malaise/fatigue and weight loss.  HENT:  Negative for congestion, hearing loss, nosebleeds, sore throat and tinnitus.   Eyes:  Negative for blurred vision, photophobia and redness.  Respiratory:  Negative for cough, hemoptysis, sputum production, shortness of breath, wheezing and stridor.   Cardiovascular:  Negative for chest pain, palpitations, orthopnea, claudication, leg swelling and PND.  Gastrointestinal:  Negative for abdominal pain, blood in stool, constipation, diarrhea, heartburn, nausea and vomiting.  Genitourinary:  Negative for dysuria, flank pain, frequency, hematuria and urgency.  Musculoskeletal:  Positive for back pain. Negative for falls, joint pain, myalgias and neck pain.  Skin:  Negative for itching and rash.  Neurological:  Positive for tingling and weakness. Negative for dizziness, tremors,  sensory change, speech change, focal weakness, seizures, loss of consciousness and headaches.  Endo/Heme/Allergies:  Negative for environmental allergies and polydipsia. Does not bruise/bleed easily.  Psychiatric/Behavioral:  Positive for depression. Negative for memory loss, substance abuse and suicidal ideas. The patient is nervous/anxious and  has insomnia.         Objective    LMP 07/27/2019 (Approximate)   Physical Exam Vitals reviewed.  Constitutional:      Appearance: Normal appearance. She is well-developed. She is obese. She is not diaphoretic.  HENT:     Head: Normocephalic and atraumatic.     Nose: No nasal deformity, septal deviation, mucosal edema or rhinorrhea.     Right Sinus: No maxillary sinus tenderness or frontal sinus tenderness.     Left Sinus: No maxillary sinus tenderness or frontal sinus tenderness.     Mouth/Throat:     Pharynx: No oropharyngeal exudate.  Eyes:     General: No scleral icterus.    Conjunctiva/sclera: Conjunctivae normal.     Pupils: Pupils are equal, round, and reactive to light.  Neck:     Thyroid: No thyromegaly.     Vascular: No carotid bruit or JVD.     Trachea: Trachea normal. No tracheal tenderness or tracheal deviation.  Cardiovascular:     Rate and Rhythm: Normal rate and regular rhythm.     Chest Wall: PMI is not displaced.     Pulses: Normal pulses. No decreased pulses.     Heart sounds: Normal heart sounds, S1 normal and S2 normal. Heart sounds not distant. No murmur heard.    No systolic murmur is present.     No diastolic murmur is present.     No friction rub. No gallop. No S3 or S4 sounds.  Pulmonary:     Effort: No tachypnea, accessory muscle usage or respiratory distress.     Breath sounds: No stridor. No decreased breath sounds, wheezing, rhonchi or rales.  Chest:     Chest wall: No tenderness.  Abdominal:     General: Bowel sounds are normal. There is no distension.     Palpations: Abdomen is soft. Abdomen is not rigid.     Tenderness: There is no abdominal tenderness. There is no guarding or rebound.  Musculoskeletal:        General: Tenderness present. Normal range of motion.     Cervical back: Normal range of motion and neck supple. No edema, erythema or rigidity. No muscular tenderness. Normal range of motion.     Comments: Tender in the low back  area  Lymphadenopathy:     Head:     Right side of head: No submental or submandibular adenopathy.     Left side of head: No submental or submandibular adenopathy.     Cervical: No cervical adenopathy.  Skin:    General: Skin is warm and dry.     Coloration: Skin is not pale.     Findings: No rash.     Nails: There is no clubbing.  Neurological:     Mental Status: She is alert and oriented to person, place, and time.     Sensory: No sensory deficit.  Psychiatric:        Attention and Perception: Attention and perception normal.        Mood and Affect: Mood is depressed.        Speech: Speech normal.        Behavior: Behavior normal. Behavior is cooperative.  Thought Content: Thought content normal. Thought content does not include homicidal or suicidal ideation.        Cognition and Memory: Cognition and memory normal.        Judgment: Judgment normal.        Assessment & Plan:   Problem List Items Addressed This Visit   None 48 minutes spent complex decision making high multiple systems assessed Issued patient with colon cancer screening kit this visit Ordered Pap smear and mammogram via cancer control program No follow-ups on file.   Asencion Noble, MD

## 2022-05-03 ENCOUNTER — Ambulatory Visit: Payer: Medicaid Other | Attending: Critical Care Medicine | Admitting: Critical Care Medicine

## 2022-05-03 ENCOUNTER — Encounter: Payer: Self-pay | Admitting: Critical Care Medicine

## 2022-05-03 ENCOUNTER — Telehealth: Payer: Self-pay | Admitting: Critical Care Medicine

## 2022-05-03 ENCOUNTER — Other Ambulatory Visit: Payer: Self-pay

## 2022-05-03 VITALS — BP 125/86 | HR 93 | Ht 60.0 in | Wt 194.0 lb

## 2022-05-03 DIAGNOSIS — F17210 Nicotine dependence, cigarettes, uncomplicated: Secondary | ICD-10-CM

## 2022-05-03 DIAGNOSIS — F411 Generalized anxiety disorder: Secondary | ICD-10-CM

## 2022-05-03 DIAGNOSIS — M5416 Radiculopathy, lumbar region: Secondary | ICD-10-CM

## 2022-05-03 DIAGNOSIS — F332 Major depressive disorder, recurrent severe without psychotic features: Secondary | ICD-10-CM

## 2022-05-03 DIAGNOSIS — E876 Hypokalemia: Secondary | ICD-10-CM | POA: Insufficient documentation

## 2022-05-03 DIAGNOSIS — M79604 Pain in right leg: Secondary | ICD-10-CM

## 2022-05-03 DIAGNOSIS — K222 Esophageal obstruction: Secondary | ICD-10-CM

## 2022-05-03 DIAGNOSIS — E7801 Familial hypercholesterolemia: Secondary | ICD-10-CM

## 2022-05-03 DIAGNOSIS — E559 Vitamin D deficiency, unspecified: Secondary | ICD-10-CM

## 2022-05-03 DIAGNOSIS — I10 Essential (primary) hypertension: Secondary | ICD-10-CM

## 2022-05-03 DIAGNOSIS — F17200 Nicotine dependence, unspecified, uncomplicated: Secondary | ICD-10-CM

## 2022-05-03 DIAGNOSIS — M79605 Pain in left leg: Secondary | ICD-10-CM

## 2022-05-03 MED ORDER — HYDROXYZINE HCL 25 MG PO TABS
25.0000 mg | ORAL_TABLET | Freq: Four times a day (QID) | ORAL | 1 refills | Status: DC
  Filled 2022-05-03: qty 60, 15d supply, fill #0
  Filled 2022-05-13 – 2022-05-18 (×3): qty 60, 15d supply, fill #1

## 2022-05-03 MED ORDER — AMITRIPTYLINE HCL 50 MG PO TABS
50.0000 mg | ORAL_TABLET | Freq: Every day | ORAL | 1 refills | Status: DC
  Filled 2022-05-03 – 2022-05-18 (×4): qty 30, 30d supply, fill #0
  Filled 2022-06-14: qty 30, 30d supply, fill #1

## 2022-05-03 MED ORDER — VITAMIN D (ERGOCALCIFEROL) 1.25 MG (50000 UNIT) PO CAPS
50000.0000 [IU] | ORAL_CAPSULE | ORAL | 2 refills | Status: DC
  Filled 2022-05-03 – 2022-05-18 (×2): qty 4, 28d supply, fill #0
  Filled 2022-06-14: qty 4, 28d supply, fill #1
  Filled 2022-07-26: qty 4, 28d supply, fill #2

## 2022-05-03 MED ORDER — GABAPENTIN 300 MG PO CAPS
300.0000 mg | ORAL_CAPSULE | Freq: Every day | ORAL | 2 refills | Status: DC
  Filled 2022-05-03 – 2022-05-18 (×3): qty 30, 30d supply, fill #0
  Filled 2022-06-14: qty 30, 30d supply, fill #1
  Filled 2022-07-26: qty 30, 30d supply, fill #2

## 2022-05-03 MED ORDER — ATORVASTATIN CALCIUM 20 MG PO TABS
20.0000 mg | ORAL_TABLET | Freq: Every day | ORAL | 2 refills | Status: DC
  Filled 2022-05-03 – 2022-06-03 (×3): qty 30, 30d supply, fill #0
  Filled 2022-06-29 – 2022-06-30 (×2): qty 30, 30d supply, fill #1
  Filled 2022-08-09 – 2022-08-16 (×3): qty 30, 30d supply, fill #2

## 2022-05-03 MED ORDER — SERTRALINE HCL 100 MG PO TABS
100.0000 mg | ORAL_TABLET | Freq: Every day | ORAL | 4 refills | Status: DC
  Filled 2022-05-03 – 2022-06-03 (×3): qty 30, 30d supply, fill #0
  Filled 2022-06-29 – 2022-06-30 (×2): qty 30, 30d supply, fill #1
  Filled 2022-08-09 (×2): qty 30, 30d supply, fill #2

## 2022-05-03 MED ORDER — PANTOPRAZOLE SODIUM 40 MG PO TBEC
40.0000 mg | DELAYED_RELEASE_TABLET | Freq: Every day | ORAL | 1 refills | Status: DC
  Filled 2022-05-03 – 2022-06-03 (×3): qty 30, 30d supply, fill #0
  Filled 2022-06-29 – 2022-06-30 (×2): qty 30, 30d supply, fill #1

## 2022-05-03 MED ORDER — VALSARTAN-HYDROCHLOROTHIAZIDE 160-25 MG PO TABS
1.0000 | ORAL_TABLET | Freq: Every day | ORAL | 3 refills | Status: AC
  Filled 2022-05-03 – 2022-06-03 (×3): qty 90, 90d supply, fill #0
  Filled 2022-08-31 – 2022-09-07 (×2): qty 90, 90d supply, fill #1
  Filled 2022-12-01: qty 90, 90d supply, fill #2
  Filled 2023-03-02: qty 90, 90d supply, fill #3

## 2022-05-03 NOTE — Assessment & Plan Note (Signed)
Blood pressure now at goal and continue valsartan HCT

## 2022-05-03 NOTE — Assessment & Plan Note (Signed)
We will get social work to have a visit for this patient and continue sertraline

## 2022-05-03 NOTE — Assessment & Plan Note (Signed)
Patient given number for physical therapy told to call to make an appointment

## 2022-05-03 NOTE — Patient Instructions (Addendum)
You have a physical therapy referral and please call to see where that stands the number is [phone]  No change in medications except will not refill any further potassium to receive the results of today's potassium test  Continue to reduce the amount of tobacco you are taking in  Referral to one of the other providers for a Pap smear will be made  Call the mammogram office and get your mammogram scheduled  Rosana Hoes our licensed clinical social worker will contact you for mental health counseling  Let our office know when you achieve the orange card so he can refer you to neurology and orthopedic surgery  Return to Dr. Joya Gaskins 5 months

## 2022-05-03 NOTE — Telephone Encounter (Signed)
Please see this patient for severe depression and anxiety I do have her on treatment she is not suicidal

## 2022-05-03 NOTE — Assessment & Plan Note (Signed)
Need to reassess metabolic panel she was given potassium at the last visit for hyperkalemia

## 2022-05-03 NOTE — Assessment & Plan Note (Signed)
  .   Current smoking consumption amount: 10 cigarettes daily  . Dicsussion on advise to quit smoking and smoking impacts: Cardiovascular impacts  . Patient's willingness to quit: Wants to quit  . Methods to quit smoking discussed: Behavioral modification  . Medication management of smoking session drugs discussed: Nicotine replacement  . Resources provided:  AVS   Setting quit date not established . Follow-up arranged 1 month   Time spent counseling the patient: 5 minutes

## 2022-05-04 ENCOUNTER — Other Ambulatory Visit: Payer: Self-pay | Admitting: Critical Care Medicine

## 2022-05-04 LAB — BMP8+EGFR
BUN/Creatinine Ratio: 13 (ref 9–23)
BUN: 15 mg/dL (ref 6–24)
CO2: 26 mmol/L (ref 20–29)
Calcium: 10.3 mg/dL — ABNORMAL HIGH (ref 8.7–10.2)
Chloride: 95 mmol/L — ABNORMAL LOW (ref 96–106)
Creatinine, Ser: 1.13 mg/dL — ABNORMAL HIGH (ref 0.57–1.00)
Glucose: 115 mg/dL — ABNORMAL HIGH (ref 70–99)
Potassium: 3.9 mmol/L (ref 3.5–5.2)
Sodium: 138 mmol/L (ref 134–144)
eGFR: 59 mL/min/{1.73_m2} — ABNORMAL LOW (ref >59)

## 2022-05-04 NOTE — Progress Notes (Signed)
Let pt know kidney stable, no change in medications except STOP potassium is normal

## 2022-05-07 ENCOUNTER — Telehealth: Payer: Self-pay

## 2022-05-07 NOTE — Telephone Encounter (Signed)
-----   Message from Elsie Stain, MD sent at 05/04/2022  7:04 AM EDT ----- Let pt know kidney stable, no change in medications except STOP potassium is normal

## 2022-05-07 NOTE — Telephone Encounter (Signed)
Pt was called and vm was left, Information has been sent to nurse pool.    Will send out the letter

## 2022-05-10 ENCOUNTER — Encounter: Payer: Self-pay | Admitting: Licensed Clinical Social Worker

## 2022-05-10 NOTE — Telephone Encounter (Signed)
LCSWA spoke with client via telephone. Patient spoke a lot about her depression and lots of anxiety. She is not sure if it's her experiencing menopause that is causing her to have these feelings. Patient talked a lot about feeling withdrawn from people and disconnects herself from everyone. She spoke on wanting to feel better and be more around others but "she just can't". She also doesn't feel like her medication is working. She discussed sometimes seeing and hearing things that's not there and over-thinking. LCSWA referred her to [facility] and [address], [place], [place] [address] [phone] an evaluation of her symptoms. We talked about how this could be more beneficial to meet with a psychiatrist for med management and evaluate her mental health status.

## 2022-05-10 NOTE — Telephone Encounter (Signed)
LCSWA called patient today to to introduce herself and to assess patients mental health needs. Patient did not answer the phone. LCSWA was able to leave a brief message with the patient asking them return the call. Patient was referred by Dr. Joya Gaskins for severe anxiety and depression

## 2022-05-11 ENCOUNTER — Encounter (HOSPITAL_COMMUNITY): Payer: Self-pay | Admitting: Registered Nurse

## 2022-05-11 ENCOUNTER — Ambulatory Visit (HOSPITAL_COMMUNITY)
Admission: EM | Admit: 2022-05-11 | Discharge: 2022-05-11 | Disposition: A | Discharge status: Home / Self Care | Payer: No Payment, Other | Attending: Registered Nurse | Admitting: Registered Nurse

## 2022-05-11 DIAGNOSIS — F32A Depression, unspecified: Secondary | ICD-10-CM | POA: Insufficient documentation

## 2022-05-11 DIAGNOSIS — F411 Generalized anxiety disorder: Secondary | ICD-10-CM | POA: Diagnosis present

## 2022-05-11 NOTE — ED Provider Notes (Signed)
Behavioral Health Urgent Care Medical Screening Exam  Patient Name: [name] [name] MRN: [id number] Date of Evaluation: 05/11/22 Chief Complaint:   Diagnosis:  Final diagnoses:  GAD (generalized anxiety disorder)    History of Present illness: [name] [name] is a 51 y.o. female patient presented to [facility] as a walk in with complaints of worsening anxiety and seeking medication management  [name] [name], 51 y.o., female patient seen face to face by this provider, consulted with Dr. Hampton Abbot; and chart reviewed on 05/11/22.  On evaluation [name] [name] reports she is prescribed medication for anxiety and depression by her primary physician (Zoloft and Vistaril).  States when called to inform that there has been no improvement of symptoms she was referred to social worker who recommended that "I come here to get set up with services for medication management.  Patient reports she has never tried any other psychotropics other than Zoloft and Vistaril.  She denies prior psychiatric hospitalization, outpatient psychiatric services, suicide attempt, and self harming behaviors.  Patient denies suicidal/self-harm/homicidal ideation, psychosis, and paranoia.   During evaluation [name] [name] is sitting upright in chair with no noted distress.  She is alert/oriented x 4; calm/cooperative; and mood congruent with affect.  She is speaking in a clear tone at moderate volume, and normal pace; with good eye contact.  Her thought process is coherent and relevant; There is no indication that she is currently responding to internal/external stimuli or experiencing delusional thought content; and she has denied suicidal/self-harm/homicidal ideation, psychosis, and paranoia.    [name] [name] is wanting to get set up with outpatient psychiatric services.  She is educated and verbalizes understanding of mental health resources and other crisis services in the community. She is instructed to call 911 and present to the nearest  emergency room should she experience any suicidal/homicidal ideation, auditory/visual/hallucinations, or detrimental worsening of her mental health condition.  She is given resources for outpatient psychiatric services and Walk in hour for [facility]   Psychiatric Specialty Exam  Presentation  General Appearance:Appropriate for Environment; Casual  Eye Contact:Good  Speech:Clear and Coherent; Normal Rate  Speech Volume:Normal  Handedness:Right   Mood and Affect  Mood:Dysphoric  Affect:Appropriate; Congruent   Thought Process  Thought Processes:Coherent; Goal Directed  Descriptions of Associations:Intact  Orientation:Full (Time, Place and Person)  Thought Content:Logical    Hallucinations:None brown creature on floor, puppies on bed the night before  Ideas of Reference:None  Suicidal Thoughts:No  Homicidal Thoughts:No   Sensorium  Memory:Immediate Good; Recent Good; Remote Good  Judgment:Intact  Insight:Present   Executive Functions  Concentration:Good  Attention Span:Good  Katie  Language:Good   Psychomotor Activity  Psychomotor Activity:Normal   Assets  Assets:Communication Skills; Desire for Improvement; Housing; Resilience; Physical Health; Social Support   Sleep  Sleep:Fair  Number of hours: 3.5   Nutritional Assessment (For OBS and FBC admissions only) Has the patient had a weight loss or gain of 10 pounds or more in the last 3 months?: No Has the patient had a decrease in food intake/or appetite?: No Does the patient have dental problems?: No Does the patient have eating habits or behaviors that may be indicators of an eating disorder including binging or inducing vomiting?: No Has the patient been eating poorly because of a decreased appetite?: 0    Physical Exam: Physical Exam Vitals and nursing note reviewed.  Constitutional:      General: She is not in acute distress.  Appearance: Normal  appearance. She is not ill-appearing.  Cardiovascular:     Rate and Rhythm: Normal rate.  Pulmonary:     Effort: Pulmonary effort is normal.  Skin:    General: Skin is warm and dry.  Neurological:     Mental Status: She is alert and oriented to person, place, and time.  Psychiatric:        Attention and Perception: Attention and perception normal. She does not perceive auditory or visual hallucinations.        Mood and Affect: Affect normal. Mood is anxious.        Speech: Speech normal.        Behavior: Behavior normal. Behavior is cooperative.        Thought Content: Thought content normal. Thought content is not paranoid or delusional. Thought content does not include homicidal or suicidal ideation.        Cognition and Memory: Cognition and memory normal.        Judgment: Judgment normal.    Review of Systems  Constitutional: Negative.   Respiratory: Negative.    Cardiovascular: Negative.   Genitourinary: Negative.   Musculoskeletal: Negative.   Skin: Negative.   Neurological: Negative.   Endo/Heme/Allergies: Negative.   Psychiatric/Behavioral:  Negative for hallucinations (Denies) and suicidal ideas (Denies). Depression: Stable.The patient is nervous/anxious (Doesn't feel that there has been improvement with medications started by her PCP). Insomnia: Sleep is fair.   Blood pressure 125/79, pulse 100, temperature 98.6 F (37 C), temperature source Oral, resp. rate 20, last menstrual period 07/27/2019, SpO2 97 %. There is no height or weight on file to calculate BMI.  Musculoskeletal: Strength & Muscle Tone: within normal limits Gait & Station: normal Patient leans: N/A   [place] MSE Discharge Disposition for Follow up and Recommendations: Based on my evaluation the patient does not appear to have an emergency medical condition and can be discharged with resources and follow up care in outpatient services for Medication Management and Individual Therapy    Discharge  Instructions       [place]: Outpatient psychiatric Services  New Patient Assessment and Therapy Walk-in Monday thru Thursday 8:00 am first come first serve until slots are full Every Friday from 1:00 pm to 4:00 pm first come first serve until slots are full  New Patient Psychiatric Medication Management Monday thru Friday from 8:00 am to 11:00 am first come first served until slots are full  For all walk-ins we ask that you arrive by 7:15 am because patients will be seen in there order of arrival.   Availability is limited, and therefore you may not be seen on the same day that you walk in.  Our goal is to serve and meet the needs of our community to the best of our ability.          Dionisio Aragones, NP 05/11/2022, 6:53 PM

## 2022-05-11 NOTE — Discharge Instructions (Addendum)
  [facility]: Outpatient psychiatric Services  New Patient Assessment and Therapy Walk-in Monday thru Thursday 8:00 am first come first serve until slots are full Every Friday from 1:00 pm to 4:00 pm first come first serve until slots are full  New Patient Psychiatric Medication Management Monday thru Friday from 8:00 am to 11:00 am first come first served until slots are full  For all walk-ins we ask that you arrive by 7:15 am because patients will be seen in there order of arrival.   Availability is limited, and therefore you may not be seen on the same day that you walk in.  Our goal is to serve and meet the needs of our community to the best of our ability.    

## 2022-05-11 NOTE — Progress Notes (Signed)
   05/11/22 1753  [name] (Walk-ins at [facility] only)  How Did You Hear About [place]? Self  What Is the Reason for Your Visit/Call Today? Pt is a 51 yo female who presented to the [facility] voluntarily and unaccompanied due to worsening depression and anxiety. She was here in July and transferred to [facility] for medical clearance. She stated that at that time she was having a bad reaction to her Zoloft. Pt stated she is still prescribed Zoloft by her PCP, Dr. Joya Gaskins, and he suggested she come her to be assessed and possibly start medication management and OP therapy. Pt denied SI, HI, NSSH, AVH, paranoia and any substance use.  How Long Has This Been Causing You Problems? 1-6 months  Have You Recently Had Any Thoughts About Hurting Yourself? No  Are You Planning to Commit Suicide/Harm Yourself At This time? No  Have you Recently Had Thoughts About [place]? No  Are You Planning To Harm Someone At This Time? No  Are you currently experiencing any auditory, visual or other hallucinations? No  Have You Used Any Alcohol or Drugs in the Past 24 Hours? No  Do you have any current medical co-morbidities that require immediate attention? No  Clinician description of patient physical appearance/behavior: Pt was calm, cooperative, alert and seemed oriented. Pt did not seem to be responding to internal stimuli or intoxicated. Pt's speech, movement and thought processes/content seemed within normal limits. Pt's judgment and insight seemed good.  What Do You Feel Would Help You the Most Today? Medication(s)  Determination of Need Routine (7 days)  Options For Referral Medication Management;Outpatient Therapy   [name] [name], [place], [facility], [facility] Triage Specialist [facility]

## 2022-05-14 ENCOUNTER — Other Ambulatory Visit (HOSPITAL_COMMUNITY): Payer: Self-pay

## 2022-05-14 ENCOUNTER — Other Ambulatory Visit: Payer: Self-pay

## 2022-05-15 ENCOUNTER — Other Ambulatory Visit (HOSPITAL_COMMUNITY): Payer: Self-pay

## 2022-05-18 ENCOUNTER — Other Ambulatory Visit: Payer: Self-pay

## 2022-05-27 ENCOUNTER — Other Ambulatory Visit (HOSPITAL_COMMUNITY): Payer: Self-pay

## 2022-05-31 ENCOUNTER — Other Ambulatory Visit: Payer: Self-pay | Admitting: Critical Care Medicine

## 2022-05-31 ENCOUNTER — Other Ambulatory Visit (HOSPITAL_COMMUNITY): Payer: Self-pay

## 2022-06-01 ENCOUNTER — Other Ambulatory Visit (HOSPITAL_COMMUNITY): Payer: Self-pay

## 2022-06-01 MED ORDER — HYDROXYZINE HCL 25 MG PO TABS
25.0000 mg | ORAL_TABLET | Freq: Four times a day (QID) | ORAL | 1 refills | Status: DC
  Filled 2022-06-01 – 2022-06-03 (×2): qty 60, 15d supply, fill #0
  Filled 2022-06-14: qty 60, 15d supply, fill #1

## 2022-06-01 NOTE — Telephone Encounter (Signed)
Requested Prescriptions  Pending Prescriptions Disp Refills  . hydrOXYzine (ATARAX) 25 MG tablet 60 tablet 1    Sig: Take 1 tablet (25 mg total) by mouth every 6 (six) hours.     Ear, Nose, and Throat:  Antihistamines 2 Failed - 05/31/2022  4:50 PM      Failed - Cr in normal range and within 360 days    Creatinine  Date Value Ref Range Status  06/29/2018 0.83 0.44 - 1.00 mg/dL Final   Creatinine, Ser  Date Value Ref Range Status  05/03/2022 1.13 (H) 0.57 - 1.00 mg/dL Final         Passed - Valid encounter within last 12 months    Recent Outpatient Visits          4 weeks ago Essential hypertension   [place], MD   1 month ago Essential hypertension   [place], MD      Future Appointments            In 4 months Joya Gaskins Burnett Harry, MD [place]

## 2022-06-03 ENCOUNTER — Other Ambulatory Visit: Payer: Self-pay

## 2022-06-03 ENCOUNTER — Other Ambulatory Visit (HOSPITAL_COMMUNITY): Payer: Self-pay

## 2022-06-08 ENCOUNTER — Other Ambulatory Visit (HOSPITAL_COMMUNITY): Payer: Self-pay

## 2022-06-15 ENCOUNTER — Other Ambulatory Visit (HOSPITAL_COMMUNITY): Payer: Self-pay

## 2022-06-16 ENCOUNTER — Other Ambulatory Visit (HOSPITAL_COMMUNITY): Payer: Self-pay

## 2022-06-17 ENCOUNTER — Other Ambulatory Visit (HOSPITAL_COMMUNITY): Payer: Self-pay

## 2022-06-18 ENCOUNTER — Other Ambulatory Visit (HOSPITAL_COMMUNITY): Payer: Self-pay

## 2022-06-29 ENCOUNTER — Other Ambulatory Visit (HOSPITAL_COMMUNITY): Payer: Self-pay

## 2022-06-30 ENCOUNTER — Other Ambulatory Visit: Payer: Self-pay

## 2022-07-05 ENCOUNTER — Ambulatory Visit: Payer: Self-pay | Admitting: *Deleted

## 2022-07-05 NOTE — Telephone Encounter (Signed)
Summary: Possible UTI symptoms seeking appt   Pt began experiencing possible UTI symptoms on Saturday, seeking advice  Best contact: [phone]      Reason for Disposition  Age > 50 years  Answer Assessment - Initial Assessment Questions 1. SEVERITY: "How bad is the pain?"  (e.g., Scale 1-10; mild, moderate, or severe)   - MILD (1-3): complains slightly about urination hurting   - MODERATE (4-7): interferes with normal activities     - SEVERE (8-10): excruciating, unwilling or unable to urinate because of the pain     severe 2. FREQUENCY: "How many times have you had painful urination today?"      once 3. PATTERN: "Is pain present every time you urinate or just sometimes?"      yes 4. ONSET: "When did the painful urination start?"      Saturday 5. FEVER: "Do you have a fever?" If Yes, ask: "What is your temperature, how was it measured, and when did it start?"     no 6. PAST UTI: "Have you had a urine infection before?" If Yes, ask: "When was the last time?" and "What happened that time?"      Yes- over 1 year 7. CAUSE: "What do you think is causing the painful urination?"  (e.g., UTI, scratch, Herpes sore)     UTI 8. OTHER SYMPTOMS: "Do you have any other symptoms?" (e.g., blood in urine, flank pain, genital sores, urgency, vaginal discharge)     Blood when wiped- 2-3 times Saturday, small amounts 9. PREGNANCY: "Is there any chance you are pregnant?" "When was your last menstrual period?"     N/a  Protocols used: Urination Pain - Female-A-AH

## 2022-07-05 NOTE — Telephone Encounter (Signed)
  Chief Complaint: urinary pain Symptoms: pain, small amounts, hematuria Saturday Frequency: Started Saturday Pertinent Negatives: Patient denies fever Disposition: '[]'$ ED /'[]'$ Urgent Care (no appt availability in office) / '[]'$ Appointment(In office/virtual)/ '[]'$  [place] [facility]/ '[]'$ Home Care/ '[]'$ Refused Recommended Disposition /'[x]'$ [place] [facility]/ '[]'$  Follow-up with PCP Additional Notes:

## 2022-07-07 NOTE — Telephone Encounter (Signed)
Left message on voicemail to return call.

## 2022-07-09 ENCOUNTER — Other Ambulatory Visit: Payer: Self-pay

## 2022-07-09 ENCOUNTER — Encounter (HOSPITAL_COMMUNITY): Payer: Self-pay

## 2022-07-09 ENCOUNTER — Ambulatory Visit (HOSPITAL_COMMUNITY)
Admission: EM | Admit: 2022-07-09 | Discharge: 2022-07-09 | Disposition: A | Discharge status: Home / Self Care | Payer: Medicaid Other | Attending: Internal Medicine | Admitting: Internal Medicine

## 2022-07-09 DIAGNOSIS — N3000 Acute cystitis without hematuria: Secondary | ICD-10-CM | POA: Insufficient documentation

## 2022-07-09 LAB — POCT URINALYSIS DIPSTICK, ED / UC
Bilirubin Urine: NEGATIVE
Glucose, UA: NEGATIVE mg/dL
Ketones, ur: NEGATIVE mg/dL
Nitrite: NEGATIVE
Protein, ur: NEGATIVE mg/dL
Specific Gravity, Urine: 1.02 (ref 1.005–1.030)
Urobilinogen, UA: 0.2 mg/dL (ref 0.0–1.0)
pH: 5.5 (ref 5.0–8.0)

## 2022-07-09 MED ORDER — SULFAMETHOXAZOLE-TRIMETHOPRIM 800-160 MG PO TABS
1.0000 | ORAL_TABLET | Freq: Two times a day (BID) | ORAL | 0 refills | Status: AC
  Filled 2022-07-09: qty 6, 3d supply, fill #0

## 2022-07-09 NOTE — ED Triage Notes (Signed)
Pt c/o burning on urination with urgency and frequency since Saturday. Denies taking any meds for sx's.

## 2022-07-09 NOTE — ED Provider Notes (Signed)
[place]    CSN: [id number] Arrival date & time: 07/09/22  1136      History   Chief Complaint Chief Complaint  Patient presents with   Urinary Tract Infection    HPI [name] VESEY is a 51 [name].o. female.   Patient presents urgent care for evaluation of dysuria, urinary frequency, and urinary urgency for the last 6 days.  No abdominal pain, nausea, vomiting, new back pain, headache, fever/chills, constipation, diarrhea, dizziness, or rash reported.  No recent antibiotic use.  No vaginal symptoms reported.  Denies hematuria.  She has been drinking a lot of water over the last 6 days and has started to drink cranberry juice to help with the discomfort with urination without very much relief.  No attempted use of any over-the-counter medications to help with urinary discomfort prior to arrival urgent care.  Denies history of diabetes myelitis and does not take an SGLT2 inhibitor.  Patient is postmenopausal.   Urinary Tract Infection   Past Medical History:  Diagnosis Date   Anxiety    history - no meds   Arthritis    bulging disc L4/5  L5/S1   Chronic back pain    Cystitis 07/2008   under care of [facility]   Depression    history - no meds   GERD (gastroesophageal reflux disease)    H/O hiatal hernia    no meds - diet controlled   Hepatitis    History Hep A at age 35 yrs - no prev problems   Hyperlipidemia    Hypertension    Insomnia    Macular degeneration 2013   Age related per pt - no meds   Pain, dental 2010   history - teeth ext per patient   Plantar fasciitis    s/p left gastroc slide    S/P dilatation of esophageal stricture 2006-2008    Patient Active Problem List   Diagnosis Date Noted   Hypokalemia 05/03/2022   Essential hypertension 02/25/2022   Severe episode of recurrent major depressive disorder, without psychotic features ([place]) 02/25/2022   Class 2 obesity due to excess calories with body mass index (BMI) of 36.0 to 36.9 in adult  02/25/2022   Lumbar radiculopathy 01/10/2020   Insomnia 09/09/2014   GAD (generalized anxiety disorder) 10/12/2006   TOBACCO ABUSE 10/12/2006   ESOPHAGEAL STRICTURE 10/12/2006   GERD 10/12/2006    Past Surgical History:  Procedure Laterality Date   [place]   COLONOSCOPY     dilatation of esophageal stricture  2005-2008   DILATION AND EVACUATION  02/09/2012   Procedure: DILATATION AND EVACUATION;  Surgeon: Osborne Oman, MD;  Location: [facility];  Service: Gynecology;  Laterality: N/A;  With ultrasound guidance   DILATION AND EVACUATION N/A 08/29/2014   Procedure: DILATATION AND EVACUATION (D&E) 2ND TRIMESTER;  Surgeon: Mora Bellman, MD;  Location: [facility];  Service: Gynecology;  Laterality: N/A;   ESOPHAGOGASTRODUODENOSCOPY N/A 09/30/2017   Procedure: ESOPHAGOGASTRODUODENOSCOPY (EGD);  Surgeon: Wonda Horner, MD;  [facility];  Service: Endoscopy;  Laterality: N/A;   gastroc slide  2005   left side    Pancystourethroscopy     plantar fasciitis     left foot   SVD  1999   x 1    OB History     Gravida  4   Para  2   Term  2   Preterm  AB  1   Living  2      SAB  1   IAB      Ectopic      Multiple      Live Births  2            Home Medications    Prior to Admission medications   Medication Sig Start Date End Date Taking? Authorizing Provider  sulfamethoxazole-trimethoprim (BACTRIM DS) 800-160 MG tablet Take 1 tablet by mouth 2 (two) times daily for 3 days. 07/09/22 07/12/22 Yes Bulah Lurie, Stasia Cavalier, FNP  amitriptyline (ELAVIL) 50 MG tablet Take 1 tablet by mouth once nightly at bedtime. 05/03/22   Elsie Stain, MD  amLODipine (NORVASC) 10 MG tablet Take 1 tablet (10 mg total) by mouth daily. 04/05/22   Elsie Stain, MD  atorvastatin (LIPITOR) 20 MG tablet Take 1 tablet (20 mg total) by mouth daily. 05/03/22   Elsie Stain, MD  gabapentin (NEURONTIN) 300 MG capsule Take 1 capsule  (300 mg total) by mouth once nightly at bedtime. 05/03/22   Elsie Stain, MD  hydrOXYzine (ATARAX) 25 MG tablet Take 1 tablet (25 mg total) by mouth every 6 (six) hours. 06/01/22   Elsie Stain, MD  ibuprofen (ADVIL) 200 MG tablet Take 800 mg by mouth daily as needed for headache.    [provider]  pantoprazole (PROTONIX) 40 MG tablet Take 1 tablet (40 mg total) by mouth once daily. 05/03/22   Elsie Stain, MD  sertraline (ZOLOFT) 100 MG tablet Take 1 tablet (100 mg total) by mouth daily. 05/03/22   Elsie Stain, MD  valsartan-hydrochlorothiazide (DIOVAN-HCT) 160-25 MG tablet Take 1 tablet by mouth daily. 05/03/22   Elsie Stain, MD  Vitamin D, Ergocalciferol, (DRISDOL) 1.25 MG (50000 UNIT) CAPS capsule Take 1 capsule (50,000 Units total) by mouth every 7 (seven) days. 05/03/22   Elsie Stain, MD    Family History Family History  Problem Relation Age of Onset   Diabetes Mother    Hyperlipidemia Mother    Stroke Mother    Hypertension Mother    Arthritis Mother    Heart disease Father    Hypertension Father    Hypertension Brother     Social History Social History   Tobacco Use   Smoking status: Every Day    Packs/day: 0.25    Years: 8.00    Total pack years: 2.00    Types: Cigarettes   Smokeless tobacco: Never   Tobacco comments:    4-5 cigs/day  Substance Use Topics   Alcohol use: Yes    Comment: rarely   Drug use: No     Allergies   Penicillins and Azithromycin   Review of Systems Review of Systems Per HPI  Physical Exam Triage Vital Signs ED Triage Vitals  Enc Vitals Group     BP 07/09/22 1252 115/78     Pulse Rate 07/09/22 1252 95     Resp 07/09/22 1252 18     Temp 07/09/22 1252 98.2 F (36.8 C)     Temp Source 07/09/22 1252 Oral     SpO2 07/09/22 1252 98 %     Weight --      Height --      Head Circumference --      Peak Flow --      Pain Score 07/09/22 1254 5     Pain Loc --      Pain Edu? --  Excl. in GC?  --    No data found.  Updated Vital Signs BP 115/78 (BP Location: Left Arm)   Pulse 95   Temp 98.2 F (36.8 C) (Oral)   Resp 18   LMP 07/27/2019 (Approximate)   SpO2 98%   Visual Acuity Right Eye Distance:   Left Eye Distance:   Bilateral Distance:    Right Eye Near:   Left Eye Near:    Bilateral Near:     Physical Exam Vitals and nursing note reviewed.  Constitutional:      Appearance: She is not ill-appearing or toxic-appearing.  HENT:     Head: Normocephalic and atraumatic.     Right Ear: Hearing and external ear normal.     Left Ear: Hearing and external ear normal.     Nose: Nose normal.     Mouth/Throat:     Lips: Pink.     Mouth: Mucous membranes are moist.     Pharynx: No posterior oropharyngeal erythema.  Eyes:     General: Lids are normal. Vision grossly intact. Gaze aligned appropriately.     Extraocular Movements: Extraocular movements intact.     Conjunctiva/sclera: Conjunctivae normal.  Pulmonary:     Effort: Pulmonary effort is normal.  Abdominal:     General: Bowel sounds are normal.     Palpations: Abdomen is soft.     Tenderness: There is no abdominal tenderness. There is no right CVA tenderness, left CVA tenderness or guarding.  Musculoskeletal:     Cervical back: Neck supple.  Skin:    General: Skin is warm and dry.     Capillary Refill: Capillary refill takes less than 2 seconds.     Findings: No rash.  Neurological:     General: No focal deficit present.     Mental Status: She is alert and oriented to person, place, and time. Mental status is at baseline.     Cranial Nerves: No dysarthria or facial asymmetry.  Psychiatric:        Mood and Affect: Mood normal.        Speech: Speech normal.        Behavior: Behavior normal.        Thought Content: Thought content normal.        Judgment: Judgment normal.      UC Treatments / Results  Labs (all labs ordered are listed, but only abnormal results are displayed) Labs Reviewed  POCT  URINALYSIS DIPSTICK, ED / UC - Abnormal; Notable for the following components:      Result Value   Hgb urine dipstick SMALL (*)    Leukocytes,Ua SMALL (*)    All other components within normal limits  URINE CULTURE    EKG   Radiology No results found.  Procedures Procedures (including critical care time)  Medications Ordered in UC Medications - No data to display  Initial Impression / Assessment and Plan / UC Course  I have reviewed the triage vital signs and the nursing notes.  Pertinent labs & imaging results that were available during my care of the patient were reviewed by me and considered in my medical decision making (see chart for details).   1.  Acute cystitis without hematuria Patient symptoms are consistent with acute uncomplicated cystitis and urinalysis shows small hemoglobin with small leukocytes.  Urine culture is pending.  We will go ahead and treat with Bactrim antibiotic twice daily for 3 days and change this based on results of urine culture if necessary.  Patient is allergic to penicillins with an anaphylactic allergy and therefore cannot take cephalosporins.  She denies allergy to sulfa antibiotics.  May take Tylenol as needed for any discomfort she may experience.  Advised to increase water intake to at least 64 ounces of water per day and avoid Sprite as this can cause further urinary symptoms and dehydration.  She does not appear to be dehydrated to physical exam and vital signs are hemodynamically stable.   Discussed physical exam and available lab work findings in clinic with patient.  Counseled patient regarding appropriate use of medications and potential side effects for all medications recommended or prescribed today. Discussed red flag signs and symptoms of worsening condition,when to call the PCP office, return to urgent care, and when to seek higher level of care in the emergency department. Patient verbalizes understanding and agreement with plan. All  questions answered. Patient discharged in stable condition.    Final Clinical Impressions(s) / UC Diagnoses   Final diagnoses:  Acute cystitis without hematuria     Discharge Instructions      Take Bactrim twice daily for the next 3 days.  Take this medicine with food to avoid stomach upset.  We will call you if your antibiotic needs to be changed based on the results of your urine culture.  Increase your water intake and avoid sprite as this can cause worsening urinary symptoms.  If you develop any new or worsening symptoms or do not improve in the next 2 to 3 days, please return.  If your symptoms are severe, please go to the emergency room.  Follow-up with your primary care provider for further evaluation and management of your symptoms as well as ongoing wellness visits.  I hope you feel better!   ED Prescriptions     Medication Sig Dispense Auth. Provider   sulfamethoxazole-trimethoprim (BACTRIM DS) 800-160 MG tablet Take 1 tablet by mouth 2 (two) times daily for 3 days. 6 tablet Talbot Grumbling, FNP      PDMP not reviewed this encounter.   Talbot Grumbling, [place] 07/09/22 1327

## 2022-07-09 NOTE — Discharge Instructions (Signed)
Take Bactrim twice daily for the next 3 days.  Take this medicine with food to avoid stomach upset.  We will call you if your antibiotic needs to be changed based on the results of your urine culture.  Increase your water intake and avoid sprite as this can cause worsening urinary symptoms.  If you develop any new or worsening symptoms or do not improve in the next 2 to 3 days, please return.  If your symptoms are severe, please go to the emergency room.  Follow-up with your primary care provider for further evaluation and management of your symptoms as well as ongoing wellness visits.  I hope you feel better!

## 2022-07-12 LAB — URINE CULTURE: Culture: >=100000 — AB

## 2022-07-26 ENCOUNTER — Other Ambulatory Visit: Payer: Self-pay | Admitting: Critical Care Medicine

## 2022-07-26 DIAGNOSIS — M79604 Pain in right leg: Secondary | ICD-10-CM

## 2022-07-27 ENCOUNTER — Other Ambulatory Visit (HOSPITAL_COMMUNITY): Payer: Self-pay

## 2022-07-27 MED ORDER — AMITRIPTYLINE HCL 50 MG PO TABS
50.0000 mg | ORAL_TABLET | Freq: Every day | ORAL | 0 refills | Status: DC
  Filled 2022-07-27: qty 90, 90d supply, fill #0

## 2022-07-27 MED ORDER — HYDROXYZINE HCL 25 MG PO TABS
25.0000 mg | ORAL_TABLET | Freq: Four times a day (QID) | ORAL | 1 refills | Status: DC
  Filled 2022-07-27 – 2022-08-09 (×2): qty 60, 15d supply, fill #0
  Filled 2022-08-09: qty 60, 15d supply, fill #1

## 2022-07-28 ENCOUNTER — Other Ambulatory Visit (HOSPITAL_COMMUNITY): Payer: Self-pay

## 2022-08-09 ENCOUNTER — Other Ambulatory Visit: Payer: Self-pay | Admitting: Critical Care Medicine

## 2022-08-09 ENCOUNTER — Other Ambulatory Visit: Payer: Self-pay

## 2022-08-09 ENCOUNTER — Other Ambulatory Visit (HOSPITAL_COMMUNITY): Payer: Self-pay

## 2022-08-09 ENCOUNTER — Other Ambulatory Visit (HOSPITAL_BASED_OUTPATIENT_CLINIC_OR_DEPARTMENT_OTHER): Payer: Self-pay

## 2022-08-09 DIAGNOSIS — K222 Esophageal obstruction: Secondary | ICD-10-CM

## 2022-08-09 MED ORDER — PANTOPRAZOLE SODIUM 40 MG PO TBEC
40.0000 mg | DELAYED_RELEASE_TABLET | Freq: Every day | ORAL | 1 refills | Status: DC
  Filled 2022-08-09 – 2022-08-16 (×3): qty 30, 30d supply, fill #0
  Filled 2022-09-08: qty 30, 30d supply, fill #1

## 2022-08-14 ENCOUNTER — Other Ambulatory Visit (HOSPITAL_COMMUNITY): Payer: Self-pay

## 2022-08-16 ENCOUNTER — Other Ambulatory Visit: Payer: Self-pay

## 2022-08-17 ENCOUNTER — Other Ambulatory Visit: Payer: Self-pay

## 2022-08-17 DIAGNOSIS — E785 Hyperlipidemia, unspecified: Secondary | ICD-10-CM | POA: Insufficient documentation

## 2022-08-18 ENCOUNTER — Telehealth: Payer: Self-pay | Admitting: Critical Care Medicine

## 2022-08-18 ENCOUNTER — Other Ambulatory Visit: Payer: Self-pay

## 2022-08-18 MED ORDER — AZITHROMYCIN 250 MG PO TABS
ORAL_TABLET | ORAL | 0 refills | Status: DC
  Filled 2022-08-18 (×2): qty 6, 5d supply, fill #0

## 2022-08-18 MED ORDER — PROMETHAZINE-DM 6.25-15 MG/5ML PO SYRP
5.0000 mL | ORAL_SOLUTION | Freq: Four times a day (QID) | ORAL | 0 refills | Status: DC | PRN
  Filled 2022-08-18: qty 160, 8d supply, fill #0

## 2022-08-18 MED ORDER — PREDNISONE 5 MG PO TABS
ORAL_TABLET | ORAL | 0 refills | Status: AC
  Filled 2022-08-18 (×2): qty 21, 6d supply, fill #0

## 2022-08-18 MED ORDER — ALBUTEROL SULFATE HFA 108 (90 BASE) MCG/ACT IN AERS
2.0000 | INHALATION_SPRAY | RESPIRATORY_TRACT | 0 refills | Status: AC | PRN
  Filled 2022-08-18: qty 18, 25d supply, fill #0

## 2022-08-18 NOTE — Telephone Encounter (Signed)
Copied from [place] [phone]. Topic: General - Other >> Aug 17, 2022  4:09 PM [place] Sous wrote: Reason for CRM: [facility] called stating that they need a fax of the patient's current [place]  Fax: [phone]

## 2022-08-25 NOTE — Telephone Encounter (Signed)
I called [facility] since no number was attached just a fax   called and left vm with the nurse about recent request

## 2022-08-31 ENCOUNTER — Other Ambulatory Visit: Payer: Self-pay | Admitting: Critical Care Medicine

## 2022-08-31 ENCOUNTER — Other Ambulatory Visit: Payer: Self-pay

## 2022-08-31 DIAGNOSIS — M79604 Pain in right leg: Secondary | ICD-10-CM

## 2022-08-31 MED ORDER — GABAPENTIN 300 MG PO CAPS
300.0000 mg | ORAL_CAPSULE | Freq: Every day | ORAL | 2 refills | Status: DC
  Filled 2022-08-31 – 2022-09-07 (×2): qty 30, 30d supply, fill #0
  Filled 2022-10-05: qty 30, 30d supply, fill #1
  Filled 2022-11-09: qty 30, 30d supply, fill #2

## 2022-09-05 ENCOUNTER — Ambulatory Visit (HOSPITAL_COMMUNITY)
Admission: EM | Admit: 2022-09-05 | Discharge: 2022-09-06 | Disposition: A | Discharge status: Home / Self Care | Payer: Medicaid Other | Attending: Nurse Practitioner | Admitting: Nurse Practitioner

## 2022-09-05 DIAGNOSIS — F1094 Alcohol use, unspecified with alcohol-induced mood disorder: Secondary | ICD-10-CM

## 2022-09-05 DIAGNOSIS — R45851 Suicidal ideations: Secondary | ICD-10-CM | POA: Insufficient documentation

## 2022-09-05 DIAGNOSIS — Z1152 Encounter for screening for COVID-19: Secondary | ICD-10-CM | POA: Insufficient documentation

## 2022-09-05 DIAGNOSIS — F332 Major depressive disorder, recurrent severe without psychotic features: Secondary | ICD-10-CM | POA: Diagnosis not present

## 2022-09-05 DIAGNOSIS — Z3202 Encounter for pregnancy test, result negative: Secondary | ICD-10-CM | POA: Diagnosis not present

## 2022-09-05 DIAGNOSIS — F419 Anxiety disorder, unspecified: Secondary | ICD-10-CM | POA: Diagnosis not present

## 2022-09-05 LAB — CBC WITH DIFFERENTIAL/PLATELET
Abs Immature Granulocytes: 0.06 10*3/uL (ref 0.00–0.07)
Basophils Absolute: 0.1 10*3/uL (ref 0.0–0.1)
Basophils Relative: 1 %
Eosinophils Absolute: 0.1 10*3/uL (ref 0.0–0.5)
Eosinophils Relative: 1 %
HCT: 43.2 % (ref 36.0–46.0)
Hemoglobin: 15 g/dL (ref 12.0–15.0)
Immature Granulocytes: 1 %
Lymphocytes Relative: 28 %
Lymphs Abs: 3.3 10*3/uL (ref 0.7–4.0)
MCH: 30.7 pg (ref 26.0–34.0)
MCHC: 34.7 g/dL (ref 30.0–36.0)
MCV: 88.5 fL (ref 80.0–100.0)
Monocytes Absolute: 0.8 10*3/uL (ref 0.1–1.0)
Monocytes Relative: 7 %
Neutro Abs: 7.2 10*3/uL (ref 1.7–7.7)
Neutrophils Relative %: 62 %
Platelets: 373 10*3/uL (ref 150–400)
RBC: 4.88 MIL/uL (ref 3.87–5.11)
RDW: 13.4 % (ref 11.5–15.5)
WBC: 11.4 10*3/uL — ABNORMAL HIGH (ref 4.0–10.5)
nRBC: 0 % (ref 0.0–0.2)

## 2022-09-05 LAB — POCT URINE DRUG SCREEN - MANUAL ENTRY (I-SCREEN)
POC Amphetamine UR: NOT DETECTED
POC Buprenorphine (BUP): NOT DETECTED
POC Cocaine UR: NOT DETECTED
POC Marijuana UR: POSITIVE — AB
POC Methadone UR: NOT DETECTED
POC Methamphetamine UR: NOT DETECTED
POC Morphine: NOT DETECTED
POC Oxazepam (BZO): NOT DETECTED
POC Oxycodone UR: NOT DETECTED
POC Secobarbital (BAR): NOT DETECTED

## 2022-09-05 LAB — LIPID PANEL
Cholesterol: 267 mg/dL — ABNORMAL HIGH (ref 0–200)
HDL: 53 mg/dL (ref >40)
LDL Cholesterol: UNDETERMINED mg/dL (ref 0–99)
Total CHOL/HDL Ratio: 5 RATIO
Triglycerides: 422 mg/dL — ABNORMAL HIGH (ref <150)
VLDL: UNDETERMINED mg/dL (ref 0–40)

## 2022-09-05 LAB — RESP PANEL BY RT-PCR (RSV, FLU A&B, COVID)  RVPGX2
Influenza A by PCR: NEGATIVE
Influenza B by PCR: NEGATIVE
Resp Syncytial Virus by PCR: NEGATIVE
SARS Coronavirus 2 by RT PCR: NEGATIVE

## 2022-09-05 LAB — POC SARS CORONAVIRUS 2 AG: SARSCOV2ONAVIRUS 2 AG: NEGATIVE

## 2022-09-05 LAB — ETHANOL: Alcohol, Ethyl (B): 103 mg/dL — ABNORMAL HIGH (ref <10)

## 2022-09-05 LAB — POCT PREGNANCY, URINE: Preg Test, Ur: NEGATIVE

## 2022-09-05 LAB — POC URINE PREG, ED: Preg Test, Ur: NEGATIVE

## 2022-09-05 MED ORDER — TRAZODONE HCL 50 MG PO TABS
50.0000 mg | ORAL_TABLET | Freq: Every evening | ORAL | Status: DC | PRN
  Administered 2022-09-05: 50 mg via ORAL
  Filled 2022-09-05: qty 1

## 2022-09-05 MED ORDER — ADULT MULTIVITAMIN W/MINERALS CH
1.0000 | ORAL_TABLET | Freq: Every day | ORAL | Status: DC
  Administered 2022-09-05 – 2022-09-06 (×2): 1 via ORAL
  Filled 2022-09-05 (×2): qty 1

## 2022-09-05 MED ORDER — GABAPENTIN 300 MG PO CAPS
300.0000 mg | ORAL_CAPSULE | Freq: Every day | ORAL | Status: DC
  Administered 2022-09-05: 300 mg via ORAL
  Filled 2022-09-05: qty 1

## 2022-09-05 MED ORDER — PANTOPRAZOLE SODIUM 40 MG PO TBEC
40.0000 mg | DELAYED_RELEASE_TABLET | Freq: Every day | ORAL | Status: DC
  Administered 2022-09-05 – 2022-09-06 (×2): 40 mg via ORAL
  Filled 2022-09-05 (×2): qty 1

## 2022-09-05 MED ORDER — IRBESARTAN 150 MG PO TABS
150.0000 mg | ORAL_TABLET | Freq: Every day | ORAL | Status: DC
  Administered 2022-09-05 – 2022-09-06 (×2): 150 mg via ORAL
  Filled 2022-09-05 (×2): qty 1

## 2022-09-05 MED ORDER — ATORVASTATIN CALCIUM 10 MG PO TABS
20.0000 mg | ORAL_TABLET | Freq: Every day | ORAL | Status: DC
  Administered 2022-09-05 – 2022-09-06 (×2): 20 mg via ORAL
  Filled 2022-09-05 (×2): qty 2

## 2022-09-05 MED ORDER — HYDROXYZINE HCL 25 MG PO TABS
25.0000 mg | ORAL_TABLET | Freq: Three times a day (TID) | ORAL | Status: DC | PRN
  Administered 2022-09-05: 25 mg via ORAL
  Filled 2022-09-05: qty 1

## 2022-09-05 MED ORDER — ACETAMINOPHEN 325 MG PO TABS: 650.0000 mg | ORAL_TABLET | Freq: Four times a day (QID) | ORAL | Status: DC | PRN

## 2022-09-05 MED ORDER — MAGNESIUM HYDROXIDE 400 MG/5ML PO SUSP: 30.0000 mL | Freq: Every day | ORAL | Status: DC | PRN

## 2022-09-05 MED ORDER — THIAMINE HCL 100 MG/ML IJ SOLN
100.0000 mg | Freq: Once | INTRAMUSCULAR | Status: AC
  Administered 2022-09-05: 100 mg via INTRAMUSCULAR
  Filled 2022-09-05: qty 2

## 2022-09-05 MED ORDER — VALSARTAN-HYDROCHLOROTHIAZIDE 160-25 MG PO TABS: 1.0000 | ORAL_TABLET | Freq: Every day | ORAL | Status: DC

## 2022-09-05 MED ORDER — THIAMINE MONONITRATE 100 MG PO TABS
100.0000 mg | ORAL_TABLET | Freq: Every day | ORAL | Status: DC
  Administered 2022-09-06: 100 mg via ORAL
  Filled 2022-09-05: qty 1

## 2022-09-05 MED ORDER — ALUM & MAG HYDROXIDE-SIMETH 200-200-20 MG/5ML PO SUSP: 30.0000 mL | ORAL | Status: DC | PRN

## 2022-09-05 MED ORDER — LORAZEPAM 1 MG PO TABS: 1.0000 mg | ORAL_TABLET | Freq: Four times a day (QID) | ORAL | Status: DC | PRN

## 2022-09-05 MED ORDER — SERTRALINE HCL 100 MG PO TABS
100.0000 mg | ORAL_TABLET | Freq: Every day | ORAL | Status: DC
  Administered 2022-09-05 – 2022-09-06 (×2): 100 mg via ORAL
  Filled 2022-09-05 (×2): qty 1

## 2022-09-05 MED ORDER — LOPERAMIDE HCL 2 MG PO CAPS: 2.0000 mg | ORAL_CAPSULE | ORAL | Status: DC | PRN

## 2022-09-05 MED ORDER — ONDANSETRON 4 MG PO TBDP: 4.0000 mg | ORAL_TABLET | Freq: Four times a day (QID) | ORAL | Status: DC | PRN

## 2022-09-05 MED ORDER — HYDROCHLOROTHIAZIDE 25 MG PO TABS
25.0000 mg | ORAL_TABLET | Freq: Every day | ORAL | Status: DC
  Administered 2022-09-05 – 2022-09-06 (×2): 25 mg via ORAL
  Filled 2022-09-05 (×2): qty 1

## 2022-09-05 MED ORDER — IBUPROFEN 800 MG PO TABS: 800.0000 mg | ORAL_TABLET | Freq: Every day | ORAL | Status: DC | PRN

## 2022-09-05 NOTE — ED Notes (Signed)
Pt refused breakfast 

## 2022-09-05 NOTE — ED Notes (Addendum)
Patient had an emesis today x1, Patient had requested that her MVI be scored. Patient felt better through out the day. Patient ate lunch and dinner went outside with other patients and staff. Patient was able to rest and sleep in between meals. Patient reported she felt good and no problems noted. Patient is being monitored for safety.

## 2022-09-05 NOTE — ED Notes (Signed)
Pt sleeping@this time. Breathing even and unlabored. Will continue to monitor for safety 

## 2022-09-05 NOTE — ED Notes (Signed)
Patient alert & oriented x 4, Respirations equal and unlabored, denies SI, HI,and AVH. Routine safety checks conducted according to facility protocol. Will continue to monitor for safety.

## 2022-09-05 NOTE — Discharge Instructions (Addendum)
Follow-up here at [facility] at 7:30 am 09/08/2022  Therapy Walk-in Hours  Monday-Wednesday: 8 AM until slots are full  Friday: 1 PM to 5 PM  For Monday to Wednesday, it is recommended that patients arrive between 7:30 AM and 7:45 AM because patients will be seen in the order of arrival.  For Friday, we ask that patients arrive between 12 PM to 12:30 PM.  Go to the second floor on arrival and check in.  **Availability is limited; therefore, patients may not be seen on the same day.**  Medication management walk-ins:  Monday to Friday: 8 AM to 11 AM.  It is recommended that patients arrive by 7:30 AM to 7:45 AM because patients will be seen in the order of arrival.  Go to the second floor on arrival and check in.  **Availability is limited; therefore, patients may not be seen on the same day.**
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
## 2022-09-05 NOTE — ED Notes (Addendum)
Dash called for stat pickup talked with Estill Bamberg the receptionist and she said she wanted to give me a heads up due to her having on one driver it will be a little while before they pickup specimen will let [facility] know
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
## 2022-09-05 NOTE — ED Notes (Addendum)
Patient went outside with Rn.will continue to monitor for safety.

## 2022-09-05 NOTE — ED Notes (Signed)
Patient admitted to [facility] for observation after her daughter IVC'd her for increased depression and anxiety. Pt is alert oriented x 4, calm and cooperative.  Patient denies SI, HI, AVH. Pt states that she has been very depressed since her mother died last month.  Patient endorses feeling depressed, having difficulty sleeping, only getting 4 hours a night, crying spells, self-isolation, decreased energy, anhedonia, easily irritated and annoyed, feelings of hopelessness and worthlessness, increased anxiety with racing thoughts and panic attacks 3 times a week.  Patient endorsed that she attempted to OD on her muscle relaxant last month.  Tonight patient states that she is grieving her recently deceased mother not currently having any suicidal ideations, paranoia, delusions or auditory visual hallucinations. Will continue to monitor for safety. Pt in bed with eyes closed NAD noted.

## 2022-09-05 NOTE — ED Provider Notes (Signed)
Behavioral Health Progress Note  Date and Time: 09/05/2022 8:53 AM Name: [name] [name] MRN:  [id number]  Subjective:  [name] [name]. Ren[place] is a 51 y/o female presented to [facility] via [facility] under IVC. Patient was petitioned for IVC by her daughter [name] [name]. Patient is single, unemployed and lives with her boyfriend and boyfriend's daughter. She was admitted for overnight observation.  Pt is seen and examined today. Pt states her mood is better . Pt slept fairly last night. She states she was having a bad day yesterday and was grieving. She also had an argument with her BF. She reports that she was upset because of her daughter called the cops on her. She endorse making suicidal statements but regret doing that. She states she had no intention of actually hurting herself. She has never hurt herself in the past and never planned to hurt herself. She reports she was punching walls due to anger. She feels calmer today and reports that her mood is better. She does agree that she needs help as her medication Zoloft has not been working. She has been taking it since June. She currently lives with her BF and his daughter and her biological daughter lives 2 houses from her. Currently, Pt denies any active or passive suicidal ideation, homicidal ideation and, visual and auditory hallucination. She denies paranoia. Pt denies any physical symptoms. She reports drinking 4-5 shots of alcohol 1-2 times a month. She states that she used vape pen 1 time but doesn't use any illicit drugs.  Denies withdrawal symptoms and cravings.She needs referral for therapy and psychiatrist . She reports that her BF is biggest support for her.  Called daughter [name] [name] [phone] for collateral - Daughter states Pt was upset and was cursing, punching walls and slamming doors.  She made statements to walk into traffic.  She reports that her mother passed away a month ago and she has been grieving. She lives 2 doors from her.  She did  not think that her mother would actually hurt herself.  Called boyfriend [name] [name] [phone] who states that patient put up a show and shows that she is ok but actually she is not.  She has been grieving and tried to overdose on muscle relaxants to sleep before her mother passed away.  He reports that before admission, she drank 2/5 of alcohol in 1-1/2-hour and drinks whenever she gets a hold on alcohol. He knows her for 30 years but has been living with her for 1.5 months only.  He has concerns that she will try to hurt herself if released today. He doesn't feel comfortable pt coming home today.    Diagnosis:  Final diagnoses:  Severe episode of recurrent major depressive disorder, without psychotic features ([place])    Total Time spent with patient: 30 minutes  Past Psychiatric History: Depression Past Medical History:  Past Medical History:  Diagnosis Date   Anxiety    history - no meds   Arthritis    bulging disc L4/5  L5/S1   Chronic back pain    Cystitis 07/2008   under care of [facility]   Depression    history - no meds   GERD (gastroesophageal reflux disease)    H/O hiatal hernia    no meds - diet controlled   Hepatitis    History Hep A at age 89 yrs - no prev problems   Hyperlipidemia    Hypertension    Insomnia    Macular degeneration  2013   Age related per pt - no meds   Pain, dental 2010   history - teeth ext per patient   Plantar fasciitis    s/p left gastroc slide    S/P dilatation of esophageal stricture 2006-2008    Past Surgical History:  Procedure Laterality Date   CESAREAN SECTION     1991   CHOLECYSTECTOMY  1991   COLONOSCOPY     dilatation of esophageal stricture  2005-2008   DILATION AND EVACUATION  02/09/2012   Procedure: DILATATION AND EVACUATION;  Surgeon: Osborne Oman, MD;  Location: [facility];  Service: Gynecology;  Laterality: N/A;  With ultrasound guidance   DILATION AND EVACUATION N/A 08/29/2014   Procedure: DILATATION AND EVACUATION  (D&E) 2ND TRIMESTER;  Surgeon: Mora Bellman, MD;  Location: [facility];  Service: Gynecology;  Laterality: N/A;   ESOPHAGOGASTRODUODENOSCOPY N/A 09/30/2017   Procedure: ESOPHAGOGASTRODUODENOSCOPY (EGD);  Surgeon: Wonda Horner, MD;  Location: [facility];  Service: Endoscopy;  Laterality: N/A;   gastroc slide  2005   left side    Pancystourethroscopy     plantar fasciitis     left foot   SVD  1999   x 1   Family History:  Family History  Problem Relation Age of Onset   Diabetes Mother    Hyperlipidemia Mother    Stroke Mother    Hypertension Mother    Arthritis Mother    Heart disease Father    Hypertension Father    Hypertension Brother    Family Psychiatric  History:  Social History:  Social History   Substance and Sexual Activity  Alcohol Use Yes   Comment: rarely     Social History   Substance and Sexual Activity  Drug Use No    Social History   Socioeconomic History   Marital status: Single    Spouse name: Not on file   Number of children: Not on file   Years of education: Not on file   Highest education level: Not on file  Occupational History   Not on file  Tobacco Use   Smoking status: Every Day    Packs/day: 0.25    Years: 8.00    Total pack years: 2.00    Types: Cigarettes   Smokeless tobacco: Never   Tobacco comments:    4-5 cigs/day  Substance and Sexual Activity   Alcohol use: Yes    Comment: rarely   Drug use: No   Sexual activity: Yes    Birth control/protection: None  Other Topics Concern   Not on file  Social History Narrative   Current smoker 1/2 PPD   Denies alcohol use   2 children    Social Determinants of Health   Financial Resource Strain: Not on file  Food Insecurity: Not on file  Transportation Needs: Not on file  Physical Activity: Not on file  Stress: Not on file  Social Connections: Not on file   SDOH:  SDOH Screenings   Depression (PHQ2-9): Medium Risk (09/05/2022)  Tobacco Use: High Risk (07/09/2022)    Additional Social History:    Pain Medications: Denies abuse Prescriptions: Denies abuse Over the Counter: Denies abuse History of alcohol / drug use?: Yes Longest period of sobriety (when/how long): Unknown Negative Consequences of Use:  (Pt denies) Withdrawal Symptoms: None Name of Substance 1: Alcohol 1 - Age of First Use: unknown 1 - Amount (size/oz): 1/2 a fifth of liquor 1 - Frequency: "about once a month" 1 - Duration: Ongoing  1 - Last Use / Amount: 09/04/2022 1 - Method of Aquiring: Store purchase 1- Route of Use: Oral ingestion                  Sleep: Fair  Appetite:  Good  Current Medications:  Current Facility-Administered Medications  Medication Dose Route Frequency Provider Last Rate Last Admin   acetaminophen (TYLENOL) tablet 650 mg  650 mg Oral Q6H PRN Bobbitt, Shalon E, NP       alum & mag hydroxide-simeth (MAALOX/MYLANTA) 200-200-20 MG/5ML suspension 30 mL  30 mL Oral Q4H PRN Bobbitt, Shalon E, NP       atorvastatin (LIPITOR) tablet 20 mg  20 mg Oral Daily Bobbitt, Shalon E, NP       gabapentin (NEURONTIN) capsule 300 mg  300 mg Oral QHS Bobbitt, Shalon E, NP       irbesartan (AVAPRO) tablet 150 mg  150 mg Oral Daily Bobbitt, Shalon E, NP       And   hydrochlorothiazide (HYDRODIURIL) tablet 25 mg  25 mg Oral Daily Bobbitt, Shalon E, NP       hydrOXYzine (ATARAX) tablet 25 mg  25 mg Oral TID PRN Bobbitt, Shalon E, NP       ibuprofen (ADVIL) tablet 800 mg  800 mg Oral Daily PRN Bobbitt, Shalon E, NP       loperamide (IMODIUM) capsule 2-4 mg  2-4 mg Oral PRN Earlin Sweeden, MD       LORazepam (ATIVAN) tablet 1 mg  1 mg Oral Q6H PRN Thales Knipple, MD       magnesium hydroxide (MILK OF MAGNESIA) suspension 30 mL  30 mL Oral Daily PRN Bobbitt, Shalon E, NP       multivitamin with minerals tablet 1 tablet  1 tablet Oral Daily Luvinia Lucy, MD       ondansetron (ZOFRAN-ODT) disintegrating tablet 4 mg  4 mg Oral Q6H PRN Atsushi Yom, MD       pantoprazole  (PROTONIX) EC tablet 40 mg  40 mg Oral Daily Bobbitt, Shalon E, NP       sertraline (ZOLOFT) tablet 100 mg  100 mg Oral Daily Bobbitt, Shalon E, NP       thiamine (VITAMIN B1) injection 100 mg  100 mg Intramuscular Once Armando Reichert, MD       [START ON 09/06/2022] thiamine (VITAMIN B1) tablet 100 mg  100 mg Oral Daily Emanuelle Hammerstrom, MD       traZODone (DESYREL) tablet 50 mg  50 mg Oral QHS PRN Bobbitt, Shalon E, NP       Current Outpatient Medications  Medication Sig Dispense Refill   albuterol (VENTOLIN HFA) 108 (90 Base) MCG/ACT inhaler Inhale 2 puffs into the lungs every 4 (four) hours as needed. 18 g 0   amitriptyline (ELAVIL) 50 MG tablet Take 1 tablet by mouth once nightly at bedtime. 90 tablet 0   amLODipine (NORVASC) 10 MG tablet Take 1 tablet (10 mg total) by mouth daily. 60 tablet 3   atorvastatin (LIPITOR) 20 MG tablet Take 1 tablet (20 mg total) by mouth daily. 30 tablet 2   azithromycin (ZITHROMAX) 250 MG tablet take 2 tablets by mouth now and 1 tablet daily for 5 days 6 tablet 0   gabapentin (NEURONTIN) 300 MG capsule Take 1 capsule (300 mg total) by mouth once nightly at bedtime. 30 capsule 2   hydrOXYzine (ATARAX) 25 MG tablet Take 1 tablet (25 mg total) by mouth every 6 (six) hours. 60 tablet  1   ibuprofen (ADVIL) 200 MG tablet Take 800 mg by mouth daily as needed for headache.     pantoprazole (PROTONIX) 40 MG tablet Take 1 tablet (40 mg total) by mouth once daily. 30 tablet 1   promethazine-dextromethorphan (PROMETHAZINE-DM) 6.25-15 MG/5ML syrup Take 5 mLs by mouth every 6 (six) hours as needed. 160 mL 0   sertraline (ZOLOFT) 100 MG tablet Take 1 tablet (100 mg total) by mouth daily. 30 tablet 4   valsartan-hydrochlorothiazide (DIOVAN-HCT) 160-25 MG tablet Take 1 tablet by mouth daily. 90 tablet 3   Vitamin D, Ergocalciferol, (DRISDOL) 1.25 MG (50000 UNIT) CAPS capsule Take 1 capsule (50,000 Units total) by mouth every 7 (seven) days. 4 capsule 2    Labs  Lab Results:   Admission on 09/05/2022  Component Date Value Ref Range Status   WBC 09/05/2022 11.4 (H)  4.0 - 10.5 K/uL Final   RBC 09/05/2022 4.88  3.87 - 5.11 MIL/uL Final   Hemoglobin 09/05/2022 15.0  12.0 - 15.0 g/dL Final   HCT 09/05/2022 43.2  36.0 - 46.0 % Final   MCV 09/05/2022 88.5  80.0 - 100.0 fL Final   MCH 09/05/2022 30.7  26.0 - 34.0 pg Final   MCHC 09/05/2022 34.7  30.0 - 36.0 g/dL Final   RDW 09/05/2022 13.4  11.5 - 15.5 % Final   Platelets 09/05/2022 373  150 - 400 K/uL Final   nRBC 09/05/2022 0.0  0.0 - 0.2 % Final   Neutrophils Relative % 09/05/2022 62  % Final   Neutro Abs 09/05/2022 7.2  1.7 - 7.7 K/uL Final   Lymphocytes Relative 09/05/2022 28  % Final   Lymphs Abs 09/05/2022 3.3  0.7 - 4.0 K/uL Final   Monocytes Relative 09/05/2022 7  % Final   Monocytes Absolute 09/05/2022 0.8  0.1 - 1.0 K/uL Final   Eosinophils Relative 09/05/2022 1  % Final   Eosinophils Absolute 09/05/2022 0.1  0.0 - 0.5 K/uL Final   Basophils Relative 09/05/2022 1  % Final   Basophils Absolute 09/05/2022 0.1  0.0 - 0.1 K/uL Final   Immature Granulocytes 09/05/2022 1  % Final   Abs Immature Granulocytes 09/05/2022 0.06  0.00 - 0.07 K/uL Final   Performed at [facility], [place] [address], [place], [place] [address]   Alcohol, Ethyl (B) 09/05/2022 103 (H)  <10 mg/dL Final   Comment: (NOTE) Lowest detectable limit for serum alcohol is 10 mg/dL.  For medical purposes only. Performed at [facility], [place] [address], [place],  [address]    Cholesterol 09/05/2022 267 (H)  0 - 200 mg/dL Final   Triglycerides 09/05/2022 422 (H)  <150 mg/dL Final   HDL 09/05/2022 53  >40 mg/dL Final   Total CHOL/HDL Ratio 09/05/2022 5.0  RATIO Final   VLDL 09/05/2022 UNABLE TO CALCULATE IF TRIGLYCERIDE OVER 400 mg/dL  0 - 40 mg/dL Final   LDL Cholesterol 09/05/2022 UNABLE TO CALCULATE IF TRIGLYCERIDE OVER 400 mg/dL  0 - 99 mg/dL Final   Comment:        Total Cholesterol/HDL:CHD Risk Coronary Heart  Disease Risk Table                     Men   Women  1/2 Average Risk   3.4   3.3  Average Risk       5.0   4.4  2 X Average Risk   9.6   7.1  3 X Average Risk  23.4   11.0        Use the calculated Patient Ratio above and the CHD Risk Table to determine the patient's CHD Risk.        ATP III CLASSIFICATION (LDL):  <100     mg/dL   Optimal  100-129  mg/dL   Near or Above                    Optimal  130-159  mg/dL   Borderline  160-189  mg/dL   High  >190     mg/dL   Very High Performed at [facility] [address], [place], [place] [address]    POC Amphetamine UR 09/05/2022 None Detected  NONE DETECTED (Cut Off Level 1000 ng/mL) Preliminary   POC Secobarbital (BAR) 09/05/2022 None Detected  NONE DETECTED (Cut Off Level 300 ng/mL) Preliminary   POC Buprenorphine (BUP) 09/05/2022 None Detected  NONE DETECTED (Cut Off Level 10 ng/mL) Preliminary   POC Oxazepam (BZO) 09/05/2022 None Detected  NONE DETECTED (Cut Off Level 300 ng/mL) Preliminary   POC Cocaine UR 09/05/2022 None Detected  NONE DETECTED (Cut Off Level 300 ng/mL) Preliminary   POC Methamphetamine UR 09/05/2022 None Detected  NONE DETECTED (Cut Off Level 1000 ng/mL) Preliminary   POC Morphine 09/05/2022 None Detected  NONE DETECTED (Cut Off Level 300 ng/mL) Preliminary   POC Methadone UR 09/05/2022 None Detected  NONE DETECTED (Cut Off Level 300 ng/mL) Preliminary   POC Oxycodone UR 09/05/2022 None Detected  NONE DETECTED (Cut Off Level 100 ng/mL) Preliminary   POC Marijuana UR 09/05/2022 Positive (A)  NONE DETECTED (Cut Off Level 50 ng/mL) Preliminary   Preg Test, Ur 09/05/2022 Negative  Negative Preliminary   SARSCOV2ONAVIRUS 2 AG 09/05/2022 NEGATIVE  NEGATIVE Final   Comment: (NOTE) SARS-CoV-2 antigen NOT DETECTED.   Negative results are presumptive.  Negative results do not preclude SARS-CoV-2 infection and should not be used as the sole basis for treatment or other patient management decisions, including  infection  control decisions, particularly in the presence of clinical signs and  symptoms consistent with COVID-19, or in those who have been in contact with the virus.  Negative results must be combined with clinical observations, patient history, and epidemiological information. The expected result is Negative.  Fact Sheet for Patients: HandmadeRecipes.com.cy  Fact Sheet for Healthcare Providers: FuneralLife.at  This test is not yet approved or cleared by the [place] FDA and  has been authorized for detection and/or diagnosis of SARS-CoV-2 by FDA under an Emergency Use Authorization (EUA).  This EUA will remain in effect (meaning this test can be used) for the duration of  the COV                          ID-19 declaration under Section 564(b)(1) of the Act, 21 U.S.C. section 360bbb-3(b)(1), unless the authorization is terminated or revoked sooner.     Preg Test, Ur 09/05/2022 NEGATIVE  NEGATIVE Final   Comment:        THE SENSITIVITY OF THIS METHODOLOGY IS >24 mIU/mL   Admission on 07/09/2022, Discharged on 07/09/2022  Component Date Value Ref Range Status   Glucose, UA 07/09/2022 NEGATIVE  NEGATIVE mg/dL Final   Bilirubin Urine 07/09/2022 NEGATIVE  NEGATIVE Final   Ketones, ur 07/09/2022 NEGATIVE  NEGATIVE mg/dL Final   Specific Gravity, Urine 07/09/2022 1.020  1.005 - 1.030 Final   Hgb urine dipstick 07/09/2022 SMALL (A)  NEGATIVE  Final   pH 07/09/2022 5.5  5.0 - 8.0 Final   Protein, ur 07/09/2022 NEGATIVE  NEGATIVE mg/dL Final   Urobilinogen, UA 07/09/2022 0.2  0.0 - 1.0 mg/dL Final   Nitrite 07/09/2022 NEGATIVE  NEGATIVE Final   Leukocytes,Ua 07/09/2022 SMALL (A)  NEGATIVE Final   Biochemical Testing Only. Please order routine urinalysis from main lab if confirmatory testing is needed.   Specimen Description 07/09/2022 URINE, CLEAN CATCH   Final   Special Requests 07/09/2022    Final                    Value:NONE Performed at [facility], [place] [address], [place], [place] [address]    Culture 07/09/2022 >=100,000 COLONIES/mL ESCHERICHIA COLI (A)   Final   Report Status 07/09/2022 07/12/2022 FINAL   Final   Organism ID, Bacteria 07/09/2022 ESCHERICHIA COLI (A)   Final  Office Visit on 05/03/2022  Component Date Value Ref Range Status   Glucose 05/03/2022 115 (H)  70 - 99 mg/dL Final   BUN 05/03/2022 15  6 - 24 mg/dL Final   Creatinine, Ser 05/03/2022 1.13 (H)  0.57 - 1.00 mg/dL Final   eGFR 05/03/2022 59 (L)  >59 mL/min/1.73 Final   BUN/Creatinine Ratio 05/03/2022 13  9 - 23 Final   Sodium 05/03/2022 138  134 - 144 mmol/L Final   Potassium 05/03/2022 3.9  3.5 - 5.2 mmol/L Final   Chloride 05/03/2022 95 (L)  96 - 106 mmol/L Final   CO2 05/03/2022 26  20 - 29 mmol/L Final   Calcium 05/03/2022 10.3 (H)  8.7 - 10.2 mg/dL Final  Office Visit on 04/05/2022  Component Date Value Ref Range Status   Fecal Occult Bld 04/07/2022 Negative  Negative Final  Admission on 03/29/2022, Discharged on 03/30/2022  Component Date Value Ref Range Status   Sodium 03/29/2022 135  135 - 145 mmol/L Final   Potassium 03/29/2022 2.9 (L)  3.5 - 5.1 mmol/L Final   Chloride 03/29/2022 93 (L)  98 - 111 mmol/L Final   CO2 03/29/2022 28  22 - 32 mmol/L Final   Glucose, Bld 03/29/2022 117 (H)  70 - 99 mg/dL Final   Glucose reference range applies only to samples taken after fasting for at least 8 hours.   BUN 03/29/2022 9  6 - 20 mg/dL Final   Creatinine, Ser 03/29/2022 0.97  0.44 - 1.00 mg/dL Final   Calcium 03/29/2022 8.6 (L)  8.9 - 10.3 mg/dL Final   Total Protein 03/29/2022 6.7  6.5 - 8.1 g/dL Final   Albumin 03/29/2022 3.7  3.5 - 5.0 g/dL Final   AST 03/29/2022 208 (H)  15 - 41 U/L Final   ALT 03/29/2022 197 (H)  0 - 44 U/L Final   Alkaline Phosphatase 03/29/2022 144 (H)  38 - 126 U/L Final   Total Bilirubin 03/29/2022 0.9  0.3 - 1.2 mg/dL Final   GFR, Estimated 03/29/2022 >60  >60 mL/min Final    Comment: (NOTE) Calculated using the CKD-EPI Creatinine Equation (2021)    Anion gap 03/29/2022 14  5 - 15 Final   Performed at [facility] [address], [place], [place] [address]   Alcohol, Ethyl (B) 03/29/2022 <10  <10 mg/dL Final   Comment: (NOTE) Lowest detectable limit for serum alcohol is 10 mg/dL.  For medical purposes only. Performed at [facility], [place] [address], [place], [place] [address]    WBC 03/29/2022 11.7 (H)  4.0 -  10.5 K/uL Final   RBC 03/29/2022 4.17  3.87 - 5.11 MIL/uL Final   Hemoglobin 03/29/2022 12.9  12.0 - 15.0 g/dL Final   HCT 03/29/2022 37.5  36.0 - 46.0 % Final   MCV 03/29/2022 89.9  80.0 - 100.0 fL Final   MCH 03/29/2022 30.9  26.0 - 34.0 pg Final   MCHC 03/29/2022 34.4  30.0 - 36.0 g/dL Final   RDW 03/29/2022 12.9  11.5 - 15.5 % Final   Platelets 03/29/2022 255  150 - 400 K/uL Final   nRBC 03/29/2022 0.0  0.0 - 0.2 % Final   Performed at [facility], [place] [address] [place], [place] [address]   Opiates 03/29/2022 NONE DETECTED  NONE DETECTED Final   Cocaine 03/29/2022 NONE DETECTED  NONE DETECTED Final   Benzodiazepines 03/29/2022 NONE DETECTED  NONE DETECTED Final   Amphetamines 03/29/2022 NONE DETECTED  NONE DETECTED Final   Tetrahydrocannabinol 03/29/2022 NONE DETECTED  NONE DETECTED Final   Barbiturates 03/29/2022 NONE DETECTED  NONE DETECTED Final   Comment: (NOTE) DRUG SCREEN FOR MEDICAL PURPOSES ONLY.  IF CONFIRMATION IS NEEDED FOR ANY PURPOSE, NOTIFY LAB WITHIN 5 DAYS.  LOWEST DETECTABLE LIMITS FOR URINE DRUG SCREEN Drug Class                     Cutoff (ng/mL) Amphetamine and metabolites    1000 Barbiturate and metabolites    200 Benzodiazepine                 416 Tricyclics and metabolites     300 Opiates and metabolites        300 Cocaine and metabolites        300 THC                            50 Performed at [facility], [place] [address] [place], [place] [address]    Magnesium 03/29/2022 1.8   1.7 - 2.4 mg/dL Final   Performed at [facility] [address] [place], [place] [address]   Potassium 03/30/2022 3.1 (L)  3.5 - 5.1 mmol/L Final   Performed at [place] [address], [place], [place] [address]   SARS Coronavirus 2 by RT PCR 03/30/2022 NEGATIVE  NEGATIVE Final   Comment: (NOTE) SARS-CoV-2 target nucleic acids are NOT DETECTED.  The SARS-CoV-2 RNA is generally detectable in upper respiratory specimens during the acute phase of infection. The lowest concentration of SARS-CoV-2 viral copies this assay can detect is 138 copies/mL. A negative result does not preclude SARS-Cov-2 infection and should not be used as the sole basis for treatment or other patient management decisions. A negative result may occur with  improper specimen collection/handling, submission of specimen other than nasopharyngeal swab, presence of viral mutation(s) within the areas targeted by this assay, and inadequate number of viral copies(<138 copies/mL). A negative result must be combined with clinical observations, patient history, and epidemiological information. The expected result is Negative.  Fact Sheet for Patients:  EntrepreneurPulse.com.au  Fact Sheet for Healthcare Providers:  IncredibleEmployment.be  This test is no                          t yet approved or cleared by the [place] FDA and  has been authorized for detection and/or diagnosis of SARS-CoV-2 by FDA under an Emergency Use Authorization (EUA). This EUA  will remain  in effect (meaning this test can be used) for the duration of the COVID-19 declaration under Section 564(b)(1) of the Act, 21 U.S.C.section 360bbb-3(b)(1), unless the authorization is terminated  or revoked sooner.       Influenza A by PCR 03/30/2022 NEGATIVE  NEGATIVE Final   Influenza B by PCR 03/30/2022 NEGATIVE  NEGATIVE Final   Comment: (NOTE) The Xpert Xpress SARS-CoV-2/FLU/RSV plus assay  is intended as an aid in the diagnosis of influenza from Nasopharyngeal swab specimens and should not be used as a sole basis for treatment. Nasal washings and aspirates are unacceptable for Xpert Xpress SARS-CoV-2/FLU/RSV testing.  Fact Sheet for Patients: EntrepreneurPulse.com.au  Fact Sheet for Healthcare Providers: IncredibleEmployment.be  This test is not yet approved or cleared by the [place] FDA and has been authorized for detection and/or diagnosis of SARS-CoV-2 by FDA under an Emergency Use Authorization (EUA). This EUA will remain in effect (meaning this test can be used) for the duration of the COVID-19 declaration under Section 564(b)(1) of the Act, 21 U.S.C. section 360bbb-3(b)(1), unless the authorization is terminated or revoked.  Performed at [facility], [place] [address], [place], [place] [address]   Admission on 03/29/2022, Discharged on 03/29/2022  Component Date Value Ref Range Status   SARS Coronavirus 2 by RT PCR 03/29/2022 NEGATIVE  NEGATIVE Final   Comment: (NOTE) SARS-CoV-2 target nucleic acids are NOT DETECTED.  The SARS-CoV-2 RNA is generally detectable in upper respiratory specimens during the acute phase of infection. The lowest concentration of SARS-CoV-2 viral copies this assay can detect is 138 copies/mL. A negative result does not preclude SARS-Cov-2 infection and should not be used as the sole basis for treatment or other patient management decisions. A negative result may occur with  improper specimen collection/handling, submission of specimen other than nasopharyngeal swab, presence of viral mutation(s) within the areas targeted by this assay, and inadequate number of viral copies(<138 copies/mL). A negative result must be combined with clinical observations, patient history, and epidemiological information. The expected result is Negative.  Fact Sheet for Patients:   EntrepreneurPulse.com.au  Fact Sheet for Healthcare Providers:  IncredibleEmployment.be  This test is no                          t yet approved or cleared by the [place] FDA and  has been authorized for detection and/or diagnosis of SARS-CoV-2 by FDA under an Emergency Use Authorization (EUA). This EUA will remain  in effect (meaning this test can be used) for the duration of the COVID-19 declaration under Section 564(b)(1) of the Act, 21 U.S.C.section 360bbb-3(b)(1), unless the authorization is terminated  or revoked sooner.       Influenza A by PCR 03/29/2022 NEGATIVE  NEGATIVE Final   Influenza B by PCR 03/29/2022 NEGATIVE  NEGATIVE Final   Comment: (NOTE) The Xpert Xpress SARS-CoV-2/FLU/RSV plus assay is intended as an aid in the diagnosis of influenza from Nasopharyngeal swab specimens and should not be used as a sole basis for treatment. Nasal washings and aspirates are unacceptable for Xpert Xpress SARS-CoV-2/FLU/RSV testing.  Fact Sheet for Patients: EntrepreneurPulse.com.au  Fact Sheet for Healthcare Providers: IncredibleEmployment.be  This test is not yet approved or cleared by the [place] FDA and has been authorized for detection and/or diagnosis of SARS-CoV-2 by FDA under an Emergency Use Authorization (EUA). This EUA will remain in effect (meaning this test can be used) for the duration of the COVID-19  declaration under Section 564(b)(1) of the Act, 21 U.S.C. section 360bbb-3(b)(1), unless the authorization is terminated or revoked.  Performed at [facility], [place] [address], [place], [place] [address]    WBC 03/29/2022 12.5 (H)  4.0 - 10.5 K/uL Final   RBC 03/29/2022 4.34  3.87 - 5.11 MIL/uL Final   Hemoglobin 03/29/2022 13.3  12.0 - 15.0 g/dL Final   HCT 03/29/2022 38.6  36.0 - 46.0 % Final   MCV 03/29/2022 88.9  80.0 - 100.0 fL Final   MCH 03/29/2022 30.6  26.0 -  34.0 pg Final   MCHC 03/29/2022 34.5  30.0 - 36.0 g/dL Final   RDW 03/29/2022 12.8  11.5 - 15.5 % Final   Platelets 03/29/2022 264  150 - 400 K/uL Final   nRBC 03/29/2022 0.0  0.0 - 0.2 % Final   Neutrophils Relative % 03/29/2022 75  % Final   Neutro Abs 03/29/2022 9.4 (H)  1.7 - 7.7 K/uL Final   Lymphocytes Relative 03/29/2022 17  % Final   Lymphs Abs 03/29/2022 2.1  0.7 - 4.0 K/uL Final   Monocytes Relative 03/29/2022 5  % Final   Monocytes Absolute 03/29/2022 0.7  0.1 - 1.0 K/uL Final   Eosinophils Relative 03/29/2022 1  % Final   Eosinophils Absolute 03/29/2022 0.2  0.0 - 0.5 K/uL Final   Basophils Relative 03/29/2022 1  % Final   Basophils Absolute 03/29/2022 0.1  0.0 - 0.1 K/uL Final   Immature Granulocytes 03/29/2022 1  % Final   Abs Immature Granulocytes 03/29/2022 0.07  0.00 - 0.07 K/uL Final   Performed at [facility], [place] [address] [place], [place] [address]   Sodium 03/29/2022 135  135 - 145 mmol/L Final   Potassium 03/29/2022 2.9 (L)  3.5 - 5.1 mmol/L Final   Chloride 03/29/2022 95 (L)  98 - 111 mmol/L Final   CO2 03/29/2022 26  22 - 32 mmol/L Final   Glucose, Bld 03/29/2022 112 (H)  70 - 99 mg/dL Final   Glucose reference range applies only to samples taken after fasting for at least 8 hours.   BUN 03/29/2022 6  6 - 20 mg/dL Final   Creatinine, Ser 03/29/2022 0.97  0.44 - 1.00 mg/dL Final   Calcium 03/29/2022 8.7 (L)  8.9 - 10.3 mg/dL Final   Total Protein 03/29/2022 7.0  6.5 - 8.1 g/dL Final   Albumin 03/29/2022 3.7  3.5 - 5.0 g/dL Final   AST 03/29/2022 249 (H)  15 - 41 U/L Final   ALT 03/29/2022 215 (H)  0 - 44 U/L Final   Alkaline Phosphatase 03/29/2022 142 (H)  38 - 126 U/L Final   Total Bilirubin 03/29/2022 0.7  0.3 - 1.2 mg/dL Final   GFR, Estimated 03/29/2022 >60  >60 mL/min Final   Comment: (NOTE) Calculated using the CKD-EPI Creatinine Equation (2021)    Anion gap 03/29/2022 14  5 - 15 Final   Performed at [facility] [address] [place], [place] [address]   Hgb A1c MFr Bld 03/29/2022 6.1 (H)  4.8 - 5.6 % Final   Comment: (NOTE) Pre diabetes:          5.7%-6.4%  Diabetes:              >6.4%  Glycemic control for   <7.0% adults with diabetes    Mean Plasma Glucose 03/29/2022 128.37  mg/dL Final   Performed at [facility]  [address], [place], [place] [address]   Alcohol, Ethyl (B) 03/29/2022 <10  <10 mg/dL Final   Comment: (NOTE) Lowest detectable limit for serum alcohol is 10 mg/dL.  For medical purposes only. Performed at [facility], [place] [address], [place], [place] [address]    Cholesterol 03/29/2022 243 (H)  0 - 200 mg/dL Final   Triglycerides 03/29/2022 474 (H)  <150 mg/dL Final   HDL 03/29/2022 58  >40 mg/dL Final   Total CHOL/HDL Ratio 03/29/2022 4.2  RATIO Final   VLDL 03/29/2022 UNABLE TO CALCULATE IF TRIGLYCERIDE OVER 400 mg/dL  0 - 40 mg/dL Final   LDL Cholesterol 03/29/2022 UNABLE TO CALCULATE IF TRIGLYCERIDE OVER 400 mg/dL  0 - 99 mg/dL Final   Comment:        Total Cholesterol/HDL:CHD Risk Coronary Heart Disease Risk Table                     Men   Women  1/2 Average Risk   3.4   3.3  Average Risk       5.0   4.4  2 X Average Risk   9.6   7.1  3 X Average Risk  23.4   11.0        Use the calculated Patient Ratio above and the CHD Risk Table to determine the patient's CHD Risk.        ATP III CLASSIFICATION (LDL):  <100     mg/dL   Optimal  100-129  mg/dL   Near or Above                    Optimal  130-159  mg/dL   Borderline  160-189  mg/dL   High  >190     mg/dL   Very High Performed at [facility] [address], [place], [place] [address]    TSH 03/29/2022 1.575  0.350 - 4.500 uIU/mL Final   Comment: Performed by a 3rd Generation assay with a functional sensitivity of <=0.01 uIU/mL. Performed at [facility], [place] [address], [place], [place] [address]    Preg Test, Ur 03/29/2022 NEGATIVE  NEGATIVE Final   Comment:        THE SENSITIVITY OF  THIS METHODOLOGY IS >20 mIU/mL. Performed at [facility], [place] [address] [place], [place] [address]    POC Amphetamine UR 03/29/2022 None Detected  NONE DETECTED (Cut Off Level 1000 ng/mL) Final   POC Secobarbital (BAR) 03/29/2022 None Detected  NONE DETECTED (Cut Off Level 300 ng/mL) Final   POC Buprenorphine (BUP) 03/29/2022 None Detected  NONE DETECTED (Cut Off Level 10 ng/mL) Final   POC Oxazepam (BZO) 03/29/2022 None Detected  NONE DETECTED (Cut Off Level 300 ng/mL) Final   POC Cocaine UR 03/29/2022 None Detected  NONE DETECTED (Cut Off Level 300 ng/mL) Final   POC Methamphetamine UR 03/29/2022 None Detected  NONE DETECTED (Cut Off Level 1000 ng/mL) Final   POC Morphine 03/29/2022 None Detected  NONE DETECTED (Cut Off Level 300 ng/mL) Final   POC Methadone UR 03/29/2022 None Detected  NONE DETECTED (Cut Off Level 300 ng/mL) Final   POC Oxycodone UR 03/29/2022 None Detected  NONE DETECTED (Cut Off Level 100 ng/mL) Final   POC Marijuana UR 03/29/2022 None Detected  NONE DETECTED (Cut Off Level 50 ng/mL) Final   SARSCOV2ONAVIRUS 2 AG 03/29/2022 NEGATIVE  NEGATIVE Final   Comment: (NOTE) SARS-CoV-2 antigen NOT DETECTED.   Negative results are  presumptive.  Negative results do not preclude SARS-CoV-2 infection and should not be used as the sole basis for treatment or other patient management decisions, including infection  control decisions, particularly in the presence of clinical signs and  symptoms consistent with COVID-19, or in those who have been in contact with the virus.  Negative results must be combined with clinical observations, patient history, and epidemiological information. The expected result is Negative.  Fact Sheet for Patients: HandmadeRecipes.com.cy  Fact Sheet for Healthcare Providers: FuneralLife.at  This test is not yet approved or cleared by the [place] FDA and  has been authorized for detection  and/or diagnosis of SARS-CoV-2 by FDA under an Emergency Use Authorization (EUA).  This EUA will remain in effect (meaning this test can be used) for the duration of  the COV                          ID-19 declaration under Section 564(b)(1) of the Act, 21 U.S.C. section 360bbb-3(b)(1), unless the authorization is terminated or revoked sooner.     Preg Test, Ur 03/29/2022 NEGATIVE  NEGATIVE Final   Comment:        THE SENSITIVITY OF THIS METHODOLOGY IS >24 mIU/mL    Direct LDL 03/29/2022 137 (H)  0 - 99 mg/dL Final   Performed at [facility], [place] [address], [place], [place] [address]    Blood Alcohol level:  Lab Results  Component Value Date   ETH 103 (H) 09/05/2022   ETH <10 16/38/4536    Metabolic Disorder Labs: Lab Results  Component Value Date   HGBA1C 6.1 (H) 03/29/2022   MPG 128.37 03/29/2022   No results found for: "PROLACTIN" Lab Results  Component Value Date   CHOL 267 (H) 09/05/2022   TRIG 422 (H) 09/05/2022   HDL 53 09/05/2022   CHOLHDL 5.0 09/05/2022   VLDL UNABLE TO CALCULATE IF TRIGLYCERIDE OVER 400 mg/dL 09/05/2022   LDLCALC UNABLE TO CALCULATE IF TRIGLYCERIDE OVER 400 mg/dL 09/05/2022   LDLCALC UNABLE TO CALCULATE IF TRIGLYCERIDE OVER 400 mg/dL 03/29/2022    Therapeutic Lab Levels: No results found for: "LITHIUM" No results found for: "VALPROATE" No results found for: "CBMZ"  Physical Findings   CAGE-AID    Flowsheet Row ED from 03/29/2022 in [place] Office Visit from 04/05/2022 in [place] Office Visit from 02/24/2022 in [place] [facility]  Total GAD-7 Score 18 18      PHQ2-9    [place] ED from 09/05/2022 in [facility] Office Visit from 04/05/2022 in [place] Office Visit from 02/24/2022 in [place] [facility] Office Visit from 03/15/2018 in  [place] Office Visit from 09/22/2016 in [place]  PHQ-2 Total Score _0 0  PHQ-9 Total Score _1 --      Flowsheet Row ED from 09/05/2022 in [facility] ED from 07/09/2022 in [facility] ED from 03/29/2022 in [place] High Risk No Risk No Risk        Musculoskeletal  Strength & Muscle Tone: within normal limits Gait & Station: normal Patient leans: N/A  Psychiatric Specialty Exam  Presentation  General Appearance:  Casual  Eye Contact: Fair  Speech: Clear and Coherent  Speech Volume: Normal  Handedness: Right   Mood and Affect  Mood: Anxious; Depressed  Affect: Appropriate   Thought Process  Thought Processes: Coherent  Descriptions of Associations:Intact  Orientation:Full (Time, Place and Person)  Thought Content:WDL  Diagnosis of Schizophrenia or Schizoaffective disorder in past: No    Hallucinations:Hallucinations: None  Ideas of Reference:None  Suicidal Thoughts:Suicidal Thoughts: Yes, Passive SI Passive Intent and/or Plan: Without Intent; Without Plan  Homicidal Thoughts:Homicidal Thoughts: No   Sensorium  Memory: Immediate Good; Recent Good; Remote Good  Judgment: Fair  Insight: Fair   Community education officer  Concentration: Fair  Attention Span: Good  Recall: Good  Fund of Knowledge: Good  Language: Good   Psychomotor Activity  Psychomotor Activity: Psychomotor Activity: Normal   Assets  Assets: Communication Skills; Desire for Improvement; Housing   Sleep  Sleep: Sleep: Fair Number of Hours of Sleep: -1   Nutritional Assessment (For OBS and FBC admissions only) Has the patient had a weight loss or gain of 10 pounds or more in the last 3 months?: No Has the patient had a decrease in food intake/or appetite?: No Does the  patient have dental problems?: No Does the patient have eating habits or behaviors that may be indicators of an eating disorder including binging or inducing vomiting?: No Has the patient recently lost weight without trying?: 0 Has the patient been eating poorly because of a decreased appetite?: 0 Malnutrition Screening Tool Score: 0    Physical Exam  Physical Exam Vitals and nursing note reviewed.  Constitutional:      General: She is not in acute distress.    Appearance: Normal appearance. She is not ill-appearing, toxic-appearing or diaphoretic.  HENT:     Head: Normocephalic and atraumatic.  Pulmonary:     Effort: Pulmonary effort is normal.  Neurological:     General: No focal deficit present.     Mental Status: She is alert and oriented to person, place, and time.    Review of Systems  Constitutional:  Negative for chills and fever.  Respiratory:  Negative for cough.   Cardiovascular:  Negative for chest pain.  Gastrointestinal:  Negative for abdominal pain, diarrhea, nausea and vomiting.  Neurological:  Negative for dizziness and headaches.  Psychiatric/Behavioral:  Negative for depression and hallucinations.    Blood pressure 94/71, pulse 95, temperature 98 F (36.7 C), temperature source Oral, resp. rate 18, last menstrual period 07/27/2019, SpO2 95 %. There is no height or weight on file to calculate BMI.  Treatment Plan Summary: Paisyn Guercio. Pleitez is a 51 y/o female presented to [facility] via [facility] under IVC. Patient was petitioned for IVC by her daughter [name] [name]. Patient is single, unemployed and lives with her boyfriend and boyfriend's daughter. She was admitted for overnight observation. Daily contact with patient to assess and evaluate symptoms and progress in treatment Labs reviewed: Covid and influenza negative, UDS positive for Marijuana, UPT negative, CBC wbc 11.4, Hb 15.0, Ethanol 103 Cholesterol 267, Triglycerides - 422  MDD, recurrent  severe Grief -Hydroxyzine 3 times daily as needed for anxiety -Trazodone for sleep -Zoloft 100 mg daily  Alcohol use disorder -Start CIWA with Ativan as needed for CIWA greater than 10 -thiamine 100 mg daily. IM today and PO from tomorrow -multivitamin with minerals daily. -Zofran 4 mg every 6 hours as needed for nausea or vomiting. - Imodium 2 to 4 mg as needed for diarrhea or  loose stools for 72 hours.   Other meds Avapro and hydrochlorothiazide Pantoprazole EC 40 mg daily  Disposition Patient will be reevaluated tomorrow morning to decide disposition.   Armando Reichert, MD 09/05/2022 8:53 AM

## 2022-09-05 NOTE — ED Notes (Signed)
Patient is calm and cooperative. Patient denies SI/HI and AVH. Patient is resting and is being monitored for safety.

## 2022-09-05 NOTE — ED Provider Notes (Signed)
[facility] Urgent Care Continuous Assessment Admission H&P  Date: 09/05/22 Patient Name: [name] [name] MRN: [id number] Chief Complaint: IVC Chief Complaint  Patient presents with   IVC      Diagnoses:  Final diagnoses:  Severe episode of recurrent major depressive disorder, without psychotic features [facility])    HPI: [name] [name]. Englert is a 51 y/o female presenting to [facility] via [facility] under IVC. Patient was petitioned for IVC by her daughter [name]. Patient is single, unemployed and lives with her boyfriend and boyfriend's daughter.    Per IVC: Responders mother passed away approximately 1 month ago and since that time respondent has become increasingly depressed and agitated drinking 1/5 of whiskey daily.  Respondent has made multiple suicidal statements to petitioner and other family members as recently as today stated she is done with everything and wants to kill herself to be with her deceased parents.  Approximately 2 to 3 weeks ago was father attempted to overdose on unknown pills owner about today's day responding left the home at the petitioner family members contacted [place] due to responded becoming agitated and continuing to threaten to kill herself.  Responded left the residence and stated that she hoped she is hit by a car.  Responded and began punching the walls in the home growing household items rage and refused to petitioner and family members in a threatening manner and slammed the door on the foot of a family member resulting in the door being broken.  Patient is alert oriented x 4, calm and cooperative with a flat affect and depressed mood.  Patient does not appear to be responding to any internal or external at this time.  Patient states that she has been very depressed since her mother died last month.  Patient endorses feeling depressed, having difficulty sleeping, only getting 4 hours a night, crying spells, self-isolation, decreased energy, anhedonia, easily irritated and annoyed,  feelings of hopelessness and worthlessness, increased anxiety with racing thoughts and panic attacks 3 times a week.  Patient endorsed that she attempted to OD on her muscle relaxant last month.  Tonight patient states that she is grieving her recently deceased mother not currently having any suicidal ideations, paranoia, delusions or auditory visual hallucinations.  Patient seems to minimize her recent suicidal overdose and current suicidal ideations.  Patient reports that she is currently taking Zoloft 100 mg daily prescribed by her PCP Erik Obey. patient states she is not currently being followed by any outpatient mental health provider or receiving any outpatient therapy.   Patient will be admitted to [facility] continuous assessment for crisis management, safety and stabilization.   PHQ 2-9:  [place] ED from 09/05/2022 in [facility] Office Visit from 04/05/2022 in [place] Office Visit from 02/24/2022 in [place] 1  Thoughts that you would be better off dead, or of hurting yourself in some way Several days Not at all Not at all  PHQ-9 Total Score _0 Flowsheet Row ED from 09/05/2022 in [facility] ED from 07/09/2022 in [facility] Urgent Care at [facility] ED from 03/29/2022 in [place] High Risk No Risk No Risk        Total Time spent with patient: 30 minutes  Musculoskeletal  Strength & Muscle Tone: within normal limits Gait & Station: normal Patient leans: N/A  Psychiatric Specialty Exam  Presentation General Appearance:  Casual  Eye Contact: Fair  Speech: Clear and Coherent  Speech Volume: Normal  Handedness: Right   Mood and Affect  Mood: Anxious; Depressed  Affect: Appropriate   Thought Process  Thought Processes: Coherent  Descriptions of  Associations:Intact  Orientation:Full (Time, Place and Person)  Thought Content:WDL    Hallucinations:Hallucinations: None  Ideas of Reference:None  Suicidal Thoughts:Suicidal Thoughts: Yes, Passive SI Passive Intent and/or Plan: Without Intent; Without Plan  Homicidal Thoughts:Homicidal Thoughts: No   Sensorium  Memory: Immediate Good; Recent Good; Remote Good  Judgment: Fair  Insight: Fair   Community education officer  Concentration: Fair  Attention Span: Good  Recall: Good  Fund of Knowledge: Good  Language: Good   Psychomotor Activity  Psychomotor Activity: Psychomotor Activity: Normal   Assets  Assets: Communication Skills; Desire for Improvement; Housing   Sleep  Sleep: Sleep: Fair Number of Hours of Sleep: -1   Nutritional Assessment (For OBS and FBC admissions only) Has the patient had a weight loss or gain of 10 pounds or more in the last 3 months?: No Has the patient had a decrease in food intake/or appetite?: No Does the patient have dental problems?: No Does the patient have eating habits or behaviors that may be indicators of an eating disorder including binging or inducing vomiting?: No Has the patient recently lost weight without trying?: 0 Has the patient been eating poorly because of a decreased appetite?: 0 Malnutrition Screening Tool Score: 0    Physical Exam HENT:     Head: Normocephalic and atraumatic.     Nose: Nose normal.  Eyes:     Pupils: Pupils are equal, round, and reactive to light.  Cardiovascular:     Rate and Rhythm: Normal rate.  Pulmonary:     Effort: Pulmonary effort is normal.  Abdominal:     General: Abdomen is flat.  Musculoskeletal:        General: Normal range of motion.     Cervical back: Normal range of motion.  Skin:    General: Skin is warm.  Neurological:     Mental Status: She is alert and oriented to person, place, and time.  Psychiatric:        Attention and Perception: Attention  normal.        Mood and Affect: Mood is anxious and depressed.        Speech: Speech normal.        Behavior: Behavior is cooperative.        Thought Content: Thought content includes suicidal ideation. Thought content includes suicidal plan.        Cognition and Memory: Cognition normal.        Judgment: Judgment is impulsive.    Review of Systems  Constitutional: Negative.   HENT: Negative.    Eyes: Negative.   Respiratory: Negative.    Cardiovascular: Negative.   Gastrointestinal: Negative.   Genitourinary: Negative.   Musculoskeletal: Negative.   Skin: Negative.   Neurological: Negative.   Endo/Heme/Allergies: Negative.   Psychiatric/Behavioral:  Positive for depression and suicidal ideas. The patient is nervous/anxious.     Blood pressure (!) 90/55, pulse (!) 114, temperature 99.5 F (37.5 C), temperature source Oral, resp. rate 20, last menstrual period 07/27/2019, SpO2 96 %. There is no height or weight on file to calculate BMI.  Past Psychiatric History: [facility],  Is the patient at risk to self? Yes  Has the patient been a risk to self in the past 6 months? Yes .  Has the patient been a risk to self within the distant past? Yes   Is the patient a risk to others? No   Has the patient been a risk to others in the past 6 months? No   Has the patient been a risk to others within the distant past? No   Past Medical History:  Past Medical History:  Diagnosis Date   Anxiety    history - no meds   Arthritis    bulging disc L4/5  L5/S1   Chronic back pain    Cystitis 07/2008   under care of [facility]   Depression    history - no meds   GERD (gastroesophageal reflux disease)    H/O hiatal hernia    no meds - diet controlled   Hepatitis    History Hep A at age 61 yrs - no prev problems   Hyperlipidemia    Hypertension    Insomnia    Macular degeneration 2013   Age related per pt - no meds   Pain, dental 2010   history - teeth ext per patient   Plantar  fasciitis    s/p left gastroc slide    S/P dilatation of esophageal stricture 2006-2008    Past Surgical History:  Procedure Laterality Date   CESAREAN SECTION     1991   CHOLECYSTECTOMY  1991   COLONOSCOPY     dilatation of esophageal stricture  2005-2008   DILATION AND EVACUATION  02/09/2012   Procedure: DILATATION AND EVACUATION;  Surgeon: Osborne Oman, MD;  Location: [facility];  Service: Gynecology;  Laterality: N/A;  With ultrasound guidance   DILATION AND EVACUATION N/A 08/29/2014   Procedure: DILATATION AND EVACUATION (D&E) 2ND TRIMESTER;  Surgeon: Mora Bellman, MD;  Location: [facility];  Service: Gynecology;  Laterality: N/A;   ESOPHAGOGASTRODUODENOSCOPY N/A 09/30/2017   Procedure: ESOPHAGOGASTRODUODENOSCOPY (EGD);  Surgeon: Wonda Horner, MD;  Location: [facility]COPY;  Service: Endoscopy;  Laterality: N/A;   gastroc slide  2005   left side    Pancystourethroscopy     plantar fasciitis     left foot   SVD  1999   x 1    Family History:  Family History  Problem Relation Age of Onset   Diabetes Mother    Hyperlipidemia Mother    Stroke Mother    Hypertension Mother    Arthritis Mother    Heart disease Father    Hypertension Father    Hypertension Brother     Social History:  Social History   Socioeconomic History   Marital status: Single    Spouse name: Not on file   Number of children: Not on file   Years of education: Not on file   Highest education level: Not on file  Occupational History   Not on file  Tobacco Use   Smoking status: Every Day    Packs/day: 0.25    Years: 8.00    Total pack years: 2.00    Types: Cigarettes   Smokeless tobacco: Never   Tobacco comments:    4-5 cigs/day  Substance and Sexual Activity   Alcohol use: Yes    Comment: rarely   Drug use: No   Sexual activity: Yes    Birth control/protection: None  Other Topics Concern   Not on file  Social History Narrative   Current smoker 1/2 PPD   Denies alcohol use   2 children     Social Determinants of Engineer, drilling  Resource Strain: Not on file  Food Insecurity: Not on file  Transportation Needs: Not on file  Physical Activity: Not on file  Stress: Not on file  Social Connections: Not on file  Intimate Partner Violence: Not on file    SDOH:  SDOH Screenings   Depression (PHQ2-9): Medium Risk (09/05/2022)  Tobacco Use: High Risk (07/09/2022)    Last Labs:  Admission on 09/05/2022  Component Date Value Ref Range Status   POC Amphetamine UR 09/05/2022 None Detected  NONE DETECTED (Cut Off Level 1000 ng/mL) Preliminary   POC Secobarbital (BAR) 09/05/2022 None Detected  NONE DETECTED (Cut Off Level 300 ng/mL) Preliminary   POC Buprenorphine (BUP) 09/05/2022 None Detected  NONE DETECTED (Cut Off Level 10 ng/mL) Preliminary   POC Oxazepam (BZO) 09/05/2022 None Detected  NONE DETECTED (Cut Off Level 300 ng/mL) Preliminary   POC Cocaine UR 09/05/2022 None Detected  NONE DETECTED (Cut Off Level 300 ng/mL) Preliminary   POC Methamphetamine UR 09/05/2022 None Detected  NONE DETECTED (Cut Off Level 1000 ng/mL) Preliminary   POC Morphine 09/05/2022 None Detected  NONE DETECTED (Cut Off Level 300 ng/mL) Preliminary   POC Methadone UR 09/05/2022 None Detected  NONE DETECTED (Cut Off Level 300 ng/mL) Preliminary   POC Oxycodone UR 09/05/2022 None Detected  NONE DETECTED (Cut Off Level 100 ng/mL) Preliminary   POC Marijuana UR 09/05/2022 Positive (A)  NONE DETECTED (Cut Off Level 50 ng/mL) Preliminary   Preg Test, Ur 09/05/2022 Negative  Negative Preliminary   SARSCOV2ONAVIRUS 2 AG 09/05/2022 NEGATIVE  NEGATIVE Final   Comment: (NOTE) SARS-CoV-2 antigen NOT DETECTED.   Negative results are presumptive.  Negative results do not preclude SARS-CoV-2 infection and should not be used as the sole basis for treatment or other patient management decisions, including infection  control decisions, particularly in the presence of clinical signs and  symptoms consistent  with COVID-19, or in those who have been in contact with the virus.  Negative results must be combined with clinical observations, patient history, and epidemiological information. The expected result is Negative.  Fact Sheet for Patients: HandmadeRecipes.com.cy  Fact Sheet for Healthcare Providers: FuneralLife.at  This test is not yet approved or cleared by the [place] FDA and  has been authorized for detection and/or diagnosis of SARS-CoV-2 by FDA under an Emergency Use Authorization (EUA).  This EUA will remain in effect (meaning this test can be used) for the duration of  the COV                          ID-19 declaration under Section 564(b)(1) of the Act, 21 U.S.C. section 360bbb-3(b)(1), unless the authorization is terminated or revoked sooner.     Preg Test, Ur 09/05/2022 NEGATIVE  NEGATIVE Final   Comment:        THE SENSITIVITY OF THIS METHODOLOGY IS >24 mIU/mL   Admission on 07/09/2022, Discharged on 07/09/2022  Component Date Value Ref Range Status   Glucose, UA 07/09/2022 NEGATIVE  NEGATIVE mg/dL Final   Bilirubin Urine 07/09/2022 NEGATIVE  NEGATIVE Final   Ketones, ur 07/09/2022 NEGATIVE  NEGATIVE mg/dL Final   Specific Gravity, Urine 07/09/2022 1.020  1.005 - 1.030 Final   Hgb urine dipstick 07/09/2022 SMALL (A)  NEGATIVE Final   pH 07/09/2022 5.5  5.0 - 8.0 Final   Protein, ur 07/09/2022 NEGATIVE  NEGATIVE mg/dL Final   Urobilinogen, UA 07/09/2022 0.2  0.0 - 1.0 mg/dL Final   Nitrite 07/09/2022 NEGATIVE  NEGATIVE  Final   Leukocytes,Ua 07/09/2022 SMALL (A)  NEGATIVE Final   Biochemical Testing Only. Please order routine urinalysis from main lab if confirmatory testing is needed.   Specimen Description 07/09/2022 URINE, CLEAN CATCH   Final   Special Requests 07/09/2022    Final                   Value:NONE Performed at [facility], [place] [address] [place], [place] [address]    Culture 07/09/2022  >=100,000 COLONIES/mL ESCHERICHIA COLI (A)   Final   Report Status 07/09/2022 07/12/2022 FINAL   Final   Organism ID, Bacteria 07/09/2022 ESCHERICHIA COLI (A)   Final  Office Visit on 05/03/2022  Component Date Value Ref Range Status   Glucose 05/03/2022 115 (H)  70 - 99 mg/dL Final   BUN 05/03/2022 15  6 - 24 mg/dL Final   Creatinine, Ser 05/03/2022 1.13 (H)  0.57 - 1.00 mg/dL Final   eGFR 05/03/2022 59 (L)  >59 mL/min/1.73 Final   BUN/Creatinine Ratio 05/03/2022 13  9 - 23 Final   Sodium 05/03/2022 138  134 - 144 mmol/L Final   Potassium 05/03/2022 3.9  3.5 - 5.2 mmol/L Final   Chloride 05/03/2022 95 (L)  96 - 106 mmol/L Final   CO2 05/03/2022 26  20 - 29 mmol/L Final   Calcium 05/03/2022 10.3 (H)  8.7 - 10.2 mg/dL Final  Office Visit on 04/05/2022  Component Date Value Ref Range Status   Fecal Occult Bld 04/07/2022 Negative  Negative Final  Admission on 03/29/2022, Discharged on 03/30/2022  Component Date Value Ref Range Status   Sodium 03/29/2022 135  135 - 145 mmol/L Final   Potassium 03/29/2022 2.9 (L)  3.5 - 5.1 mmol/L Final   Chloride 03/29/2022 93 (L)  98 - 111 mmol/L Final   CO2 03/29/2022 28  22 - 32 mmol/L Final   Glucose, Bld 03/29/2022 117 (H)  70 - 99 mg/dL Final   Glucose reference range applies only to samples taken after fasting for at least 8 hours.   BUN 03/29/2022 9  6 - 20 mg/dL Final   Creatinine, Ser 03/29/2022 0.97  0.44 - 1.00 mg/dL Final   Calcium 03/29/2022 8.6 (L)  8.9 - 10.3 mg/dL Final   Total Protein 03/29/2022 6.7  6.5 - 8.1 g/dL Final   Albumin 03/29/2022 3.7  3.5 - 5.0 g/dL Final   AST 03/29/2022 208 (H)  15 - 41 U/L Final   ALT 03/29/2022 197 (H)  0 - 44 U/L Final   Alkaline Phosphatase 03/29/2022 144 (H)  38 - 126 U/L Final   Total Bilirubin 03/29/2022 0.9  0.3 - 1.2 mg/dL Final   GFR, Estimated 03/29/2022 >60  >60 mL/min Final   Comment: (NOTE) Calculated using the CKD-EPI Creatinine Equation (2021)    Anion gap 03/29/2022 14  5 - 15 Final    Performed at [facility] [address] [place], [place] [address]   Alcohol, Ethyl (B) 03/29/2022 <10  <10 mg/dL Final   Comment: (NOTE) Lowest detectable limit for serum alcohol is 10 mg/dL.  For medical purposes only. Performed at [facility], [place] [address], [place], [place] [address]    WBC 03/29/2022 11.7 (H)  4.0 - 10.5 K/uL Final   RBC 03/29/2022 4.17  3.87 - 5.11 MIL/uL Final   Hemoglobin 03/29/2022 12.9  12.0 - 15.0 g/dL Final   HCT 03/29/2022 37.5  36.0 - 46.0 % Final   MCV  03/29/2022 89.9  80.0 - 100.0 fL Final   MCH 03/29/2022 30.9  26.0 - 34.0 pg Final   MCHC 03/29/2022 34.4  30.0 - 36.0 g/dL Final   RDW 03/29/2022 12.9  11.5 - 15.5 % Final   Platelets 03/29/2022 255  150 - 400 K/uL Final   nRBC 03/29/2022 0.0  0.0 - 0.2 % Final   Performed at [facility], [place] [address] [place], [place] [address]   Opiates 03/29/2022 NONE DETECTED  NONE DETECTED Final   Cocaine 03/29/2022 NONE DETECTED  NONE DETECTED Final   Benzodiazepines 03/29/2022 NONE DETECTED  NONE DETECTED Final   Amphetamines 03/29/2022 NONE DETECTED  NONE DETECTED Final   Tetrahydrocannabinol 03/29/2022 NONE DETECTED  NONE DETECTED Final   Barbiturates 03/29/2022 NONE DETECTED  NONE DETECTED Final   Comment: (NOTE) DRUG SCREEN FOR MEDICAL PURPOSES ONLY.  IF CONFIRMATION IS NEEDED FOR ANY PURPOSE, NOTIFY LAB WITHIN 5 DAYS.  LOWEST DETECTABLE LIMITS FOR URINE DRUG SCREEN Drug Class                     Cutoff (ng/mL) Amphetamine and metabolites    1000 Barbiturate and metabolites    200 Benzodiazepine                 937 Tricyclics and metabolites     300 Opiates and metabolites        300 Cocaine and metabolites        300 THC                            50 Performed at [facility], [place] [address] [place], [place] [address]    Magnesium 03/29/2022 1.8  1.7 - 2.4 mg/dL Final   Performed at [facility] [address] [place], [place] [address]   Potassium  03/30/2022 3.1 (L)  3.5 - 5.1 mmol/L Final   Performed at [facility] [address], [place], [place] [address]   SARS Coronavirus 2 by RT PCR 03/30/2022 NEGATIVE  NEGATIVE Final   Comment: (NOTE) SARS-CoV-2 target nucleic acids are NOT DETECTED.  The SARS-CoV-2 RNA is generally detectable in upper respiratory specimens during the acute phase of infection. The lowest concentration of SARS-CoV-2 viral copies this assay can detect is 138 copies/mL. A negative result does not preclude SARS-Cov-2 infection and should not be used as the sole basis for treatment or other patient management decisions. A negative result may occur with  improper specimen collection/handling, submission of specimen other than nasopharyngeal swab, presence of viral mutation(s) within the areas targeted by this assay, and inadequate number of viral copies(<138 copies/mL). A negative result must be combined with clinical observations, patient history, and epidemiological information. The expected result is Negative.  Fact Sheet for Patients:  EntrepreneurPulse.com.au  Fact Sheet for Healthcare Providers:  IncredibleEmployment.be  This test is no                          t yet approved or cleared by the [place] FDA and  has been authorized for detection and/or diagnosis of SARS-CoV-2 by FDA under an Emergency Use Authorization (EUA). This EUA will remain  in effect (meaning this test can be used) for the duration of the COVID-19 declaration under Section 564(b)(1) of the Act, 21 U.S.C.section 360bbb-3(b)(1), unless the authorization is terminated  or revoked sooner.  Influenza A by PCR 03/30/2022 NEGATIVE  NEGATIVE Final   Influenza B by PCR 03/30/2022 NEGATIVE  NEGATIVE Final   Comment: (NOTE) The Xpert Xpress SARS-CoV-2/FLU/RSV plus assay is intended as an aid in the diagnosis of influenza from Nasopharyngeal swab specimens and should not be used as  a sole basis for treatment. Nasal washings and aspirates are unacceptable for Xpert Xpress SARS-CoV-2/FLU/RSV testing.  Fact Sheet for Patients: EntrepreneurPulse.com.au  Fact Sheet for Healthcare Providers: IncredibleEmployment.be  This test is not yet approved or cleared by the [place] FDA and has been authorized for detection and/or diagnosis of SARS-CoV-2 by FDA under an Emergency Use Authorization (EUA). This EUA will remain in effect (meaning this test can be used) for the duration of the COVID-19 declaration under Section 564(b)(1) of the Act, 21 U.S.C. section 360bbb-3(b)(1), unless the authorization is terminated or revoked.  Performed at [facility], [place] [address], [place],  [address]   Admission on 03/29/2022, Discharged on 03/29/2022  Component Date Value Ref Range Status   SARS Coronavirus 2 by RT PCR 03/29/2022 NEGATIVE  NEGATIVE Final   Comment: (NOTE) SARS-CoV-2 target nucleic acids are NOT DETECTED.  The SARS-CoV-2 RNA is generally detectable in upper respiratory specimens during the acute phase of infection. The lowest concentration of SARS-CoV-2 viral copies this assay can detect is 138 copies/mL. A negative result does not preclude SARS-Cov-2 infection and should not be used as the sole basis for treatment or other patient management decisions. A negative result may occur with  improper specimen collection/handling, submission of specimen other than nasopharyngeal swab, presence of viral mutation(s) within the areas targeted by this assay, and inadequate number of viral copies(<138 copies/mL). A negative result must be combined with clinical observations, patient history, and epidemiological information. The expected result is Negative.  Fact Sheet for Patients:  EntrepreneurPulse.com.au  Fact Sheet for Healthcare Providers:  IncredibleEmployment.be  This test  is no                          t yet approved or cleared by the [place] FDA and  has been authorized for detection and/or diagnosis of SARS-CoV-2 by FDA under an Emergency Use Authorization (EUA). This EUA will remain  in effect (meaning this test can be used) for the duration of the COVID-19 declaration under Section 564(b)(1) of the Act, 21 U.S.C.section 360bbb-3(b)(1), unless the authorization is terminated  or revoked sooner.       Influenza A by PCR 03/29/2022 NEGATIVE  NEGATIVE Final   Influenza B by PCR 03/29/2022 NEGATIVE  NEGATIVE Final   Comment: (NOTE) The Xpert Xpress SARS-CoV-2/FLU/RSV plus assay is intended as an aid in the diagnosis of influenza from Nasopharyngeal swab specimens and should not be used as a sole basis for treatment. Nasal washings and aspirates are unacceptable for Xpert Xpress SARS-CoV-2/FLU/RSV testing.  Fact Sheet for Patients: EntrepreneurPulse.com.au  Fact Sheet for Healthcare Providers: IncredibleEmployment.be  This test is not yet approved or cleared by the [place] FDA and has been authorized for detection and/or diagnosis of SARS-CoV-2 by FDA under an Emergency Use Authorization (EUA). This EUA will remain in effect (meaning this test can be used) for the duration of the COVID-19 declaration under Section 564(b)(1) of the Act, 21 U.S.C. section 360bbb-3(b)(1), unless the authorization is terminated or revoked.  Performed at [facility], [place] [address] [place], [place] [address]    WBC 03/29/2022 12.5 (H)  4.0 -  10.5 K/uL Final   RBC 03/29/2022 4.34  3.87 - 5.11 MIL/uL Final   Hemoglobin 03/29/2022 13.3  12.0 - 15.0 g/dL Final   HCT 03/29/2022 38.6  36.0 - 46.0 % Final   MCV 03/29/2022 88.9  80.0 - 100.0 fL Final   MCH 03/29/2022 30.6  26.0 - 34.0 pg Final   MCHC 03/29/2022 34.5  30.0 - 36.0 g/dL Final   RDW 03/29/2022 12.8  11.5 - 15.5 % Final   Platelets 03/29/2022 264  150 -  400 K/uL Final   nRBC 03/29/2022 0.0  0.0 - 0.2 % Final   Neutrophils Relative % 03/29/2022 75  % Final   Neutro Abs 03/29/2022 9.4 (H)  1.7 - 7.7 K/uL Final   Lymphocytes Relative 03/29/2022 17  % Final   Lymphs Abs 03/29/2022 2.1  0.7 - 4.0 K/uL Final   Monocytes Relative 03/29/2022 5  % Final   Monocytes Absolute 03/29/2022 0.7  0.1 - 1.0 K/uL Final   Eosinophils Relative 03/29/2022 1  % Final   Eosinophils Absolute 03/29/2022 0.2  0.0 - 0.5 K/uL Final   Basophils Relative 03/29/2022 1  % Final   Basophils Absolute 03/29/2022 0.1  0.0 - 0.1 K/uL Final   Immature Granulocytes 03/29/2022 1  % Final   Abs Immature Granulocytes 03/29/2022 0.07  0.00 - 0.07 K/uL Final   Performed at [facility], [place] [address] [place], [place] [address]   Sodium 03/29/2022 135  135 - 145 mmol/L Final   Potassium 03/29/2022 2.9 (L)  3.5 - 5.1 mmol/L Final   Chloride 03/29/2022 95 (L)  98 - 111 mmol/L Final   CO2 03/29/2022 26  22 - 32 mmol/L Final   Glucose, Bld 03/29/2022 112 (H)  70 - 99 mg/dL Final   Glucose reference range applies only to samples taken after fasting for at least 8 hours.   BUN 03/29/2022 6  6 - 20 mg/dL Final   Creatinine, Ser 03/29/2022 0.97  0.44 - 1.00 mg/dL Final   Calcium 03/29/2022 8.7 (L)  8.9 - 10.3 mg/dL Final   Total Protein 03/29/2022 7.0  6.5 - 8.1 g/dL Final   Albumin 03/29/2022 3.7  3.5 - 5.0 g/dL Final   AST 03/29/2022 249 (H)  15 - 41 U/L Final   ALT 03/29/2022 215 (H)  0 - 44 U/L Final   Alkaline Phosphatase 03/29/2022 142 (H)  38 - 126 U/L Final   Total Bilirubin 03/29/2022 0.7  0.3 - 1.2 mg/dL Final   GFR, Estimated 03/29/2022 >60  >60 mL/min Final   Comment: (NOTE) Calculated using the CKD-EPI Creatinine Equation (2021)    Anion gap 03/29/2022 14  5 - 15 Final   Performed at [facility] [address] [place], [place] [address]   Hgb A1c MFr Bld 03/29/2022 6.1 (H)  4.8 - 5.6 % Final   Comment: (NOTE) Pre diabetes:           5.7%-6.4%  Diabetes:              >6.4%  Glycemic control for   <7.0% adults with diabetes    Mean Plasma Glucose 03/29/2022 128.37  mg/dL Final   Performed at [facility] [address], [place], [place] [address]   Alcohol, Ethyl (B) 03/29/2022 <10  <10 mg/dL Final   Comment: (NOTE) Lowest detectable limit for serum alcohol is 10 mg/dL.  For medical purposes only. Performed at [facility], [place]  [address] [place], [place] [address]    Cholesterol 03/29/2022 243 (H)  0 - 200 mg/dL Final   Triglycerides 03/29/2022 474 (H)  <150 mg/dL Final   HDL 03/29/2022 58  >40 mg/dL Final   Total CHOL/HDL Ratio 03/29/2022 4.2  RATIO Final   VLDL 03/29/2022 UNABLE TO CALCULATE IF TRIGLYCERIDE OVER 400 mg/dL  0 - 40 mg/dL Final   LDL Cholesterol 03/29/2022 UNABLE TO CALCULATE IF TRIGLYCERIDE OVER 400 mg/dL  0 - 99 mg/dL Final   Comment:        Total Cholesterol/HDL:CHD Risk Coronary Heart Disease Risk Table                     Men   Women  1/2 Average Risk   3.4   3.3  Average Risk       5.0   4.4  2 X Average Risk   9.6   7.1  3 X Average Risk  23.4   11.0        Use the calculated Patient Ratio above and the CHD Risk Table to determine the patient's CHD Risk.        ATP III CLASSIFICATION (LDL):  <100     mg/dL   Optimal  100-129  mg/dL   Near or Above                    Optimal  130-159  mg/dL   Borderline  160-189  mg/dL   High  >190     mg/dL   Very High Performed at [facility] [address], [place], [place] [address]    TSH 03/29/2022 1.575  0.350 - 4.500 uIU/mL Final   Comment: Performed by a 3rd Generation assay with a functional sensitivity of <=0.01 uIU/mL. Performed at [facility], [place] [address] [place], [place] [address]    Preg Test, Ur 03/29/2022 NEGATIVE  NEGATIVE Final   Comment:        THE SENSITIVITY OF THIS METHODOLOGY IS >20 mIU/mL. Performed at [facility], [place] [address], [place], [place] [address]    POC  Amphetamine UR 03/29/2022 None Detected  NONE DETECTED (Cut Off Level 1000 ng/mL) Final   POC Secobarbital (BAR) 03/29/2022 None Detected  NONE DETECTED (Cut Off Level 300 ng/mL) Final   POC Buprenorphine (BUP) 03/29/2022 None Detected  NONE DETECTED (Cut Off Level 10 ng/mL) Final   POC Oxazepam (BZO) 03/29/2022 None Detected  NONE DETECTED (Cut Off Level 300 ng/mL) Final   POC Cocaine UR 03/29/2022 None Detected  NONE DETECTED (Cut Off Level 300 ng/mL) Final   POC Methamphetamine UR 03/29/2022 None Detected  NONE DETECTED (Cut Off Level 1000 ng/mL) Final   POC Morphine 03/29/2022 None Detected  NONE DETECTED (Cut Off Level 300 ng/mL) Final   POC Methadone UR 03/29/2022 None Detected  NONE DETECTED (Cut Off Level 300 ng/mL) Final   POC Oxycodone UR 03/29/2022 None Detected  NONE DETECTED (Cut Off Level 100 ng/mL) Final   POC Marijuana UR 03/29/2022 None Detected  NONE DETECTED (Cut Off Level 50 ng/mL) Final   SARSCOV2ONAVIRUS 2 AG 03/29/2022 NEGATIVE  NEGATIVE Final   Comment: (NOTE) SARS-CoV-2 antigen NOT DETECTED.   Negative results are presumptive.  Negative results do not preclude SARS-CoV-2 infection and should not be used as the sole basis for treatment or other patient management decisions, including infection  control decisions, particularly in the presence of clinical signs and  symptoms consistent with  COVID-19, or in those who have been in contact with the virus.  Negative results must be combined with clinical observations, patient history, and epidemiological information. The expected result is Negative.  Fact Sheet for Patients: HandmadeRecipes.com.cy  Fact Sheet for Healthcare Providers: FuneralLife.at  This test is not yet approved or cleared by the [place] FDA and  has been authorized for detection and/or diagnosis of SARS-CoV-2 by FDA under an Emergency Use Authorization (EUA).  This EUA will remain in effect  (meaning this test can be used) for the duration of  the COV                          ID-19 declaration under Section 564(b)(1) of the Act, 21 U.S.C. section 360bbb-3(b)(1), unless the authorization is terminated or revoked sooner.     Preg Test, Ur 03/29/2022 NEGATIVE  NEGATIVE Final   Comment:        THE SENSITIVITY OF THIS METHODOLOGY IS >24 mIU/mL    Direct LDL 03/29/2022 137 (H)  0 - 99 mg/dL Final   Performed at [facility], [place] [address] [place], [place] [address]    Allergies: Penicillins and Azithromycin  PTA Medications:  No current facility-administered medications on file prior to encounter.   Current Outpatient Medications on File Prior to Encounter  Medication Sig Dispense Refill   albuterol (VENTOLIN HFA) 108 (90 Base) MCG/ACT inhaler Inhale 2 puffs into the lungs every 4 (four) hours as needed. 18 g 0   amitriptyline (ELAVIL) 50 MG tablet Take 1 tablet by mouth once nightly at bedtime. 90 tablet 0   amLODipine (NORVASC) 10 MG tablet Take 1 tablet (10 mg total) by mouth daily. 60 tablet 3   atorvastatin (LIPITOR) 20 MG tablet Take 1 tablet (20 mg total) by mouth daily. 30 tablet 2   azithromycin (ZITHROMAX) 250 MG tablet take 2 tablets by mouth now and 1 tablet daily for 5 days 6 tablet 0   gabapentin (NEURONTIN) 300 MG capsule Take 1 capsule (300 mg total) by mouth once nightly at bedtime. 30 capsule 2   hydrOXYzine (ATARAX) 25 MG tablet Take 1 tablet (25 mg total) by mouth every 6 (six) hours. 60 tablet 1   ibuprofen (ADVIL) 200 MG tablet Take 800 mg by mouth daily as needed for headache.     pantoprazole (PROTONIX) 40 MG tablet Take 1 tablet (40 mg total) by mouth once daily. 30 tablet 1   promethazine-dextromethorphan (PROMETHAZINE-DM) 6.25-15 MG/5ML syrup Take 5 mLs by mouth every 6 (six) hours as needed. 160 mL 0   sertraline (ZOLOFT) 100 MG tablet Take 1 tablet (100 mg total) by mouth daily. 30 tablet 4   valsartan-hydrochlorothiazide (DIOVAN-HCT)  160-25 MG tablet Take 1 tablet by mouth daily. 90 tablet 3   Vitamin D, Ergocalciferol, (DRISDOL) 1.25 MG (50000 UNIT) CAPS capsule Take 1 capsule (50,000 Units total) by mouth every 7 (seven) days. 4 capsule 2     Medical Decision Making  nna M. Raglin is a 51 y/o female presenting to [facility] via [facility] under IVC. Patient was petitioned for IVC by her daughter [name] for suicidal ideations with threats to walk into traffic to be hit by a car.     Recommendations  Based on my evaluation the patient does not appear to have an emergency medical condition.  Patient will be admitted to [facility] continue assessment for crisis management, safety and stabilization.  Lucia Bitter, NP  09/05/22  5:33 AM

## 2022-09-06 MED ORDER — VENLAFAXINE HCL ER 75 MG PO CP24
75.0000 mg | ORAL_CAPSULE | Freq: Every day | ORAL | 0 refills | Status: DC
  Filled 2022-09-06: qty 30, 30d supply, fill #0

## 2022-09-06 MED ORDER — HYDROXYZINE HCL 25 MG PO TABS
25.0000 mg | ORAL_TABLET | Freq: Three times a day (TID) | ORAL | 1 refills | Status: DC | PRN
  Filled 2022-09-06: qty 60, 10d supply, fill #0
  Filled 2022-09-20 – 2022-09-30 (×2): qty 60, 10d supply, fill #1

## 2022-09-06 NOTE — ED Provider Notes (Signed)
FBC/OBS ASAP Discharge Summary  Date and Time: 09/06/2022 8:26 AM  Name: [name] [name]  MRN:  [id number]   Discharge Diagnoses:  Final diagnoses:  Severe episode of recurrent major depressive disorder, without psychotic features ([place])    Subjective: "I don't know why I said I wanted to kill myself, I would never kill myself"  Stay Summary:  RONNE SAVOIA patient presented to [facility], 09/05/2022, under IVC petition, petitioned by patient's daughter due to suicidal ideations, suicidal threats, secondary to alcohol intoxication.  Patient has a history of GAD, MDD, and alcohol use disorder.   [name] [name], 51 y.o., female patient seen face to face by this provider, consulted with Dr. Leverne Humbles; and chart reviewed on 09/06/22.    On evaluation [name] [name] reports, that her mother died and was buried on 2023/09/13, and she admits to dealing with stress, depression related to mother's death and ongoing family conflict with her siblings.  Patient reports she has 4 brothers and for the most part they do not all get along.  She endorses that recently she has been utilizing alcohol inappropriately to manage stress and grief related to her mother's death.  [name] reports recently she moved in with her boyfriend and his daughter as she was previously living alone and felt this would be a better environment to reduce her depression and sense of loneliness.  She reports since moving in with her wife and she does feel that her depression has improved however admits to over indulging in alcohol and recalls making statements indicating thoughts of killing herself.  She reports today that she would never commit suicide and she has no idea why she made the statements however contributes poor behavior and suicidal comments to over indulging in alcohol. Patient is currently taking fluoxetine and Vistaril which was prescribed by her primary care provider for treatment of anxiety and depression.  She reports that fluoxetine has  not been helping with her depression however her PCP a few months ago increased the dose and she still has not felt significant improvement.  Patient reports she has never attempted therapy however is open to therapy.  She does not feel she has a problem with alcohol however feels she has more of a problem with coping and choosing better ways to manage her stress.  She reports she can go weeks without drinking alcohol however something significant happens pertaining to stress or feeling overwhelmed she has a tendency to turn to alcohol for management of stressful and emotional situations.  She endorses that this is a maladaptive coping mechanism.  She reports she only started socially drinking alcohol about 10 years ago but admits  as time has gone on she will have periods in which she consumes large volumes of alcohol.  Drinks of choice include liquor.  Endorses use of marijuana however denies any other illicit drug use.  UDS is positive for marijuana.  Patient endorses a good relationship with her daughter however felt upset that her daughter called the police but understands that her behavior contributed to her daughter calling the police and having her brought here for psychiatric evaluation. Requests review of current psychiatric medications as she feels the hydroxyzine was helping but could benefit from a dose adjustment, Zoloft is not working despite increase in dose, and requests to continue Trazodone 50 mg for sleep as this has helped achieve sleep while here at [facility].  [place] [facility]:  [name] [name] # [phone], patient's boyfriend, who endorses patient has struggled  more with alcohol and depression since the lost of her mother. [name] [name] endorses that he feels safe with patient returning home as long as she has resources for follow-up. He endorses that patient has not grieved appropriately since the death of her mother and needs therapy. [name] [name] agrees to come and pick patient up and agrees to assist with  patient returning here in 2 days to establish care here at [facility] outpatient clinic.   During evaluation [name] [name] is (position) in no acute distress.  She is alert/oriented x 4; calm/cooperative; and mood is euthymic with a congruent affect. She is speaking in a clear tone at moderate volume, and normal pace; with good eye contact.  Her thought process is coherent and relevant; There is no indication that /she is currently responding to internal/external stimuli or experiencing delusional thought content; and she has denied suicidal/self-harm/homicidal ideation, psychosis, and paranoia.  Patient has remained calm throughout assessment and has answered questions appropriately.  Patient is able to contract for safety and denies any intentions or thoughts of self harm or harming others. Endorses that would never go through with ending her life and endorses protective factors as she would never hurt her children and has not desire to end her life.   At this time [name] [name] is educated and verbalizes understanding of mental health resources and other crisis services in the community. She is instructed to call 911 and present to the nearest emergency room should she experience any suicidal/homicidal ideation auditory/visual/hallucinations, or detrimental worsening of her mental health condition. [name] [name] was a also advised by Probation officer that she could call the toll-free phone on insurance card to assist with identifying in network counselors and agencies or number on back of Medicaid card to speak with care coordinator.    Total Time spent with patient: 30 minutes  Past Medical History:  Past Medical History:  Diagnosis Date   Anxiety    history - no meds   Arthritis    bulging disc L4/5  L5/S1   Chronic back pain    Cystitis 07/2008   under care of [facility]   Depression    history - no meds   GERD (gastroesophageal reflux disease)    H/O hiatal hernia    no meds - diet controlled    Hepatitis    History Hep A at age 30 yrs - no prev problems   Hyperlipidemia    Hypertension    Insomnia    Macular degeneration 2013   Age related per pt - no meds   Pain, dental 2010   history - teeth ext per patient   Plantar fasciitis    s/p left gastroc slide    S/P dilatation of esophageal stricture 2006-2008    Past Surgical History:  Procedure Laterality Date   CESAREAN SECTION     1991   CHOLECYSTECTOMY  1991   COLONOSCOPY     dilatation of esophageal stricture  2005-2008   DILATION AND EVACUATION  02/09/2012   Procedure: DILATATION AND EVACUATION;  Surgeon: Osborne Oman, MD;  Location: [facility];  Service: Gynecology;  Laterality: N/A;  With ultrasound guidance   DILATION AND EVACUATION N/A 08/29/2014   Procedure: DILATATION AND EVACUATION (D&E) 2ND TRIMESTER;  Surgeon: Mora Bellman, MD;  Location: [facility];  Service: Gynecology;  Laterality: N/A;   ESOPHAGOGASTRODUODENOSCOPY N/A 09/30/2017   Procedure: ESOPHAGOGASTRODUODENOSCOPY (EGD);  Surgeon: Wonda Horner, MD;  Location[facility];  Service: Endoscopy;  Laterality: N/A;  gastroc slide  2005   left side    Pancystourethroscopy     plantar fasciitis     left foot   SVD  1999   x 1   Family History:  Family History  Problem Relation Age of Onset   Diabetes Mother    Hyperlipidemia Mother    Stroke Mother    Hypertension Mother    Arthritis Mother    Heart disease Father    Hypertension Father    Hypertension Brother    Social History:  Social History   Substance and Sexual Activity  Alcohol Use Yes   Comment: rarely     Social History   Substance and Sexual Activity  Drug Use No    Social History   Socioeconomic History   Marital status: Single    Spouse name: Not on file   Number of children: Not on file   Years of education: Not on file   Highest education level: Not on file  Occupational History   Not on file  Tobacco Use   Smoking status: Every Day    Packs/day: 0.25    Years:  8.00    Total pack years: 2.00    Types: Cigarettes   Smokeless tobacco: Never   Tobacco comments:    4-5 cigs/day  Substance and Sexual Activity   Alcohol use: Yes    Comment: rarely   Drug use: No   Sexual activity: Yes    Birth control/protection: None  Other Topics Concern   Not on file  Social History Narrative   Current smoker 1/2 PPD   Denies alcohol use   2 children    Social Determinants of Health   Financial Resource Strain: Not on file  Food Insecurity: Not on file  Transportation Needs: Not on file  Physical Activity: Not on file  Stress: Not on file  Social Connections: Not on file   SDOH:  SDOH Screenings   Depression (PHQ2-9): Medium Risk (09/05/2022)  Tobacco Use: High Risk (07/09/2022)    Tobacco Cessation:  A prescription for an FDA-approved tobacco cessation medication provided at discharge  Current Medications:  Current Facility-Administered Medications  Medication Dose Route Frequency Provider Last Rate Last Admin   acetaminophen (TYLENOL) tablet 650 mg  650 mg Oral Q6H PRN Bobbitt, Shalon E, NP       alum & mag hydroxide-simeth (MAALOX/MYLANTA) 200-200-20 MG/5ML suspension 30 mL  30 mL Oral Q4H PRN Bobbitt, Shalon E, NP       atorvastatin (LIPITOR) tablet 20 mg  20 mg Oral Daily Bobbitt, Shalon E, NP   20 mg at 09/05/22 0954   gabapentin (NEURONTIN) capsule 300 mg  300 mg Oral QHS Bobbitt, Shalon E, NP   300 mg at 09/05/22 2122   irbesartan (AVAPRO) tablet 150 mg  150 mg Oral Daily Bobbitt, Shalon E, NP   150 mg at 09/05/22 9983   And   hydrochlorothiazide (HYDRODIURIL) tablet 25 mg  25 mg Oral Daily Bobbitt, Shalon E, NP   25 mg at 09/05/22 1037   hydrOXYzine (ATARAX) tablet 25 mg  25 mg Oral TID PRN Bobbitt, Shalon E, NP   25 mg at 09/05/22 2123   ibuprofen (ADVIL) tablet 800 mg  800 mg Oral Daily PRN Bobbitt, Shalon E, NP       loperamide (IMODIUM) capsule 2-4 mg  2-4 mg Oral PRN Doda, Vandana, MD       LORazepam (ATIVAN) tablet 1 mg  1 mg  Oral Q6H PRN  Armando Reichert, MD       magnesium hydroxide (MILK OF MAGNESIA) suspension 30 mL  30 mL Oral Daily PRN Bobbitt, Shalon E, NP       multivitamin with minerals tablet 1 tablet  1 tablet Oral Daily Doda, Vandana, MD   1 tablet at 09/05/22 0954   ondansetron (ZOFRAN-ODT) disintegrating tablet 4 mg  4 mg Oral Q6H PRN Armando Reichert, MD       pantoprazole (PROTONIX) EC tablet 40 mg  40 mg Oral Daily Bobbitt, Shalon E, NP   40 mg at 09/05/22 0955   sertraline (ZOLOFT) tablet 100 mg  100 mg Oral Daily Bobbitt, Shalon E, NP   100 mg at 09/05/22 5732   thiamine (VITAMIN B1) tablet 100 mg  100 mg Oral Daily Doda, Vandana, MD       traZODone (DESYREL) tablet 50 mg  50 mg Oral QHS PRN Bobbitt, Shalon E, NP   50 mg at 09/05/22 2122   Current Outpatient Medications  Medication Sig Dispense Refill   albuterol (VENTOLIN HFA) 108 (90 Base) MCG/ACT inhaler Inhale 2 puffs into the lungs every 4 (four) hours as needed. 18 g 0   amitriptyline (ELAVIL) 50 MG tablet Take 1 tablet by mouth once nightly at bedtime. 90 tablet 0   amLODipine (NORVASC) 10 MG tablet Take 1 tablet (10 mg total) by mouth daily. 60 tablet 3   atorvastatin (LIPITOR) 20 MG tablet Take 1 tablet (20 mg total) by mouth daily. 30 tablet 2   azithromycin (ZITHROMAX) 250 MG tablet take 2 tablets by mouth now and 1 tablet daily for 5 days 6 tablet 0   gabapentin (NEURONTIN) 300 MG capsule Take 1 capsule (300 mg total) by mouth once nightly at bedtime. 30 capsule 2   hydrOXYzine (ATARAX) 25 MG tablet Take 1 tablet (25 mg total) by mouth every 6 (six) hours. 60 tablet 1   ibuprofen (ADVIL) 200 MG tablet Take 800 mg by mouth daily as needed for headache.     pantoprazole (PROTONIX) 40 MG tablet Take 1 tablet (40 mg total) by mouth once daily. 30 tablet 1   sertraline (ZOLOFT) 100 MG tablet Take 1 tablet (100 mg total) by mouth daily. 30 tablet 4   tiZANidine (ZANAFLEX) 4 MG tablet Take 4 mg by mouth 3 (three) times daily.      valsartan-hydrochlorothiazide (DIOVAN-HCT) 160-25 MG tablet Take 1 tablet by mouth daily. 90 tablet 3    PTA Medications: (Not in a hospital admission)      09/05/2022    4:27 AM 04/05/2022    3:42 PM 02/25/2022    9:24 AM  Depression screen PHQ 2/9  Decreased Interest '1 3 3  '$ Down, Depressed, Hopeless '1 3 3  '$ PHQ - 2 Score '2 6 6  '$ Altered sleeping '1 3 3  '$ Tired, decreased energy '1 3 3  '$ Change in appetite 1 3 0  Feeling bad or failure about yourself  1 2 0  Trouble concentrating '1 3 1  '$ Moving slowly or fidgety/restless 1 0 0  Suicidal thoughts 1 0 0  PHQ-9 Score '9 20 13  '$ Difficult doing work/chores Somewhat difficult  Extremely dIfficult    Flowsheet Row ED from 09/05/2022 in [facility] ED from 07/09/2022 in [facility] ED from 03/29/2022 in [place] High Risk No Risk No Risk       Musculoskeletal  Strength & Muscle Tone: within  normal limits Gait & Station: normal Patient leans: N/A  Psychiatric Specialty Exam  Presentation  General Appearance:  Appropriate for Environment  Eye Contact: Good  Speech: Clear and Coherent  Speech Volume: Normal  Handedness: Right   Mood and Affect  Mood: Euthymic  Affect: Appropriate   Thought Process  Thought Processes: Coherent  Descriptions of Associations:Intact  Orientation:Full (Time, Place and Person)  Thought Content:WDL  Diagnosis of Schizophrenia or Schizoaffective disorder in past: No    Hallucinations:Hallucinations: None  Ideas of Reference:None  Suicidal Thoughts:Suicidal Thoughts: No SI Passive Intent and/or Plan: Without Intent  Homicidal Thoughts:Homicidal Thoughts: No   Sensorium  Memory: Immediate Good; Recent Good; Remote Good  Judgment: Fair  Insight: Good   Executive Functions  Concentration: Good  Attention Span: Good  Recall: Good  Fund of  Knowledge: Good  Language: Good   Psychomotor Activity  Psychomotor Activity: Psychomotor Activity: Normal   Assets  Assets: Communication Skills; Desire for Improvement; Housing; Physical Health   Sleep  Sleep: Sleep: Poor Number of Hours of Sleep: 0 (Difficulty falling asleep and frequent awakenings)   Nutritional Assessment (For OBS and FBC admissions only) Has the patient had a weight loss or gain of 10 pounds or more in the last 3 months?: No Has the patient had a decrease in food intake/or appetite?: No Does the patient have dental problems?: No Does the patient have eating habits or behaviors that may be indicators of an eating disorder including binging or inducing vomiting?: No Has the patient recently lost weight without trying?: 0 Has the patient been eating poorly because of a decreased appetite?: 0 Malnutrition Screening Tool Score: 0    Physical Exam  Physical Exam Constitutional:      Appearance: Normal appearance.  HENT:     Head: Normocephalic and atraumatic.     Right Ear: External ear normal.     Left Ear: External ear normal.     Nose: Nose normal.  Eyes:     Extraocular Movements: Extraocular movements intact.     Pupils: Pupils are equal, round, and reactive to light.  Cardiovascular:     Rate and Rhythm: Normal rate and regular rhythm.  Pulmonary:     Effort: Pulmonary effort is normal.     Breath sounds: Normal breath sounds.  Musculoskeletal:        General: Normal range of motion.     Cervical back: Normal range of motion.  Skin:    Capillary Refill: Capillary refill takes less than 2 seconds.  Neurological:     General: No focal deficit present.     Mental Status: She is alert.     Review of Systems  Psychiatric/Behavioral:  Positive for depression and substance abuse. The patient is nervous/anxious and has insomnia.       Blood pressure 134/83, pulse (!) 115, temperature 98.2 F (36.8 C), temperature source Oral, resp.  rate 18, last menstrual period 07/27/2019, SpO2 99 %. There is no height or weight on file to calculate BMI.  Demographic Factors:  Caucasian  Loss Factors: Loss of significant relationship  Historical Factors: NA  Risk Reduction Factors:   Living with another person, especially a relative, Positive social support, and Positive therapeutic relationship  Continued Clinical Symptoms:  Severe Anxiety and/or Agitation Depression:   Comorbid alcohol abuse/dependence Insomnia Alcohol/Substance Abuse/Dependencies  Cognitive Features That Contribute To Risk:  None    Suicide Risk:  Mild:  Suicidal ideation of limited frequency, intensity, duration, and specificity.  There are  no identifiable plans, no associated intent, mild dysphoria and related symptoms, good self-control (both objective and subjective assessment), few other risk factors, and identifiable protective factors, including available and accessible social support.  Plan Of Care/Follow-up recommendations:  Other:  Recommend intensive outpatient therapy and medication management at [facility] within 48 hours in the outpatient psychiatric clinic.  Resources provided for grief counseling and for outpatient substance treatment. -Discontinue Zoloft 100 mg and start Effexor 75 mg XR daily for depression and GAD. Trazodone 50 mg daily bedtime PRN for sleep, and Vistaril 50 mg TID PRN for anxiety.  Disposition: Discharge, home to self with close outpatient follow-up.  Molli Barrows, FNP-C, PMHNP-BC  09/06/2022, 8:26 AM

## 2022-09-06 NOTE — ED Notes (Signed)
Pt sleeping in recliner bed, easily awakened, A/o x 4. Denies SI. HI/AVH. No noted resp distress. Will continue to monitor for safety

## 2022-09-06 NOTE — ED Notes (Signed)
Pt sleeping'@this'$  time. Breathing even and unlabored will continue to monitor for safety

## 2022-09-07 ENCOUNTER — Other Ambulatory Visit: Payer: Self-pay

## 2022-09-08 ENCOUNTER — Other Ambulatory Visit: Payer: Self-pay

## 2022-09-08 ENCOUNTER — Other Ambulatory Visit: Payer: Self-pay | Admitting: Critical Care Medicine

## 2022-09-08 DIAGNOSIS — E7801 Familial hypercholesterolemia: Secondary | ICD-10-CM

## 2022-09-08 MED ORDER — ATORVASTATIN CALCIUM 20 MG PO TABS
20.0000 mg | ORAL_TABLET | Freq: Every day | ORAL | 2 refills | Status: DC
  Filled 2022-09-08: qty 30, 30d supply, fill #0
  Filled 2022-10-08: qty 30, 30d supply, fill #1
  Filled 2022-11-09: qty 30, 30d supply, fill #2

## 2022-09-09 ENCOUNTER — Other Ambulatory Visit (HOSPITAL_BASED_OUTPATIENT_CLINIC_OR_DEPARTMENT_OTHER): Payer: Self-pay

## 2022-09-09 ENCOUNTER — Other Ambulatory Visit: Payer: Self-pay

## 2022-09-27 ENCOUNTER — Other Ambulatory Visit: Payer: Self-pay

## 2022-09-30 ENCOUNTER — Other Ambulatory Visit: Payer: Self-pay

## 2022-10-04 NOTE — Progress Notes (Deleted)
New Patient Office Visit  Subjective    Patient ID: [name] [name], female    DOB: 1971-02-11  Age: 52 y.o. MRN: [id number]  CC: HTN  HPI [name] [name] presents to establish care This patient arrives here to establish care 04/05/22 Patient's previously was seen in June to mobile medicine unit as documented below MMU OV 02/2022 [name] [name] presents for medication refills.  States that she has been taking blood pressure medication for the past 5 to 6 years.  States that she has been out of her medication for the past few months.  States that she does have a blood pressure cuff at home, and will start checking her blood pressure on a daily basis.  States that she has not been checking it previously, just received a blood pressure cuff.  States that she has been having intermittent sharp headaches since being out of her blood pressure medication.  States that she has been going to orthopedics for bulging disc which is caused her to have bilateral leg pain.  States that she takes amitriptyline and gabapentin as well as Norco.  States that she has not been able to follow-up with them due to financial constraints.  States that she has been having elevated anxiety, and a depressed mood.  States that she always has the feeling of "doom and gloom."  States that she is having difficulty falling asleep and staying asleep, has failed melatonin.  States that she was previously prescribed medication to help her with her depression, states she believes it was Lexapro, states that she did not take it for longer than 30 days, states that she did not notice any difference.  [name].    1. Essential hypertension Resume regimen.  Patient encouraged to check blood pressure at home on a daily basis, keep a written log and have available for all office visits.  Patient given appointment for [facility].  Patient  given application for [place] financial assistance, medications sent to community pharmacy at [facility] to help with financial constraints, patient given appointment to [facility] with Dr. Joya Gaskins on April 05, 2022.  Red flags given for prompt reevaluation. - olmesartan-hydrochlorothiazide (BENICAR HCT) 20-12.5 MG tablet; Take 1 tablet by mouth once daily.  Dispense: 30 tablet; Refill: 1 - CBC with Differential/Platelet; Future - Comp. Metabolic Panel (12); Future - Lipid panel; Future - TSH; Future   2. Hypertensive urgency Dose given in clinic, patient education given on potential side effects. - cloNIDine (CATAPRES) tablet 0.2 mg   3. GAD (generalized anxiety disorder) Trial Zoloft, hydroxyzine.  Patient education given on coping skills - hydrOXYzine (ATARAX) 25 MG tablet; Take 1 tablet (25 mg total) by mouth 3 (three) times daily as needed.  Dispense: 60 tablet; Refill: 1 - sertraline (ZOLOFT) 50 MG tablet; Take 1 tablet (50 mg total) by mouth once daily.  Dispense: 30 tablet; Refill: 3 - Vitamin D, 25-hydroxy; Future   4. Severe episode of recurrent major depressive disorder, without psychotic features ([place])     5. Psychophysiological insomnia Patient education given on good sleep hygiene   6. Pain in both lower extremities Continue current regimen - amitriptyline (ELAVIL) 50 MG tablet; Take 1 tablet (50 mg total) by mouth once nightly at bedtime.  Dispense: 30 tablet; Refill: 1 - gabapentin (NEURONTIN) 300 MG capsule; Take 1 capsule (300 mg total) by mouth once  nightly at bedtime.  Dispense: 30 capsule; Refill: 2   7. ESOPHAGEAL STRICTURE Continue current regimen - pantoprazole (PROTONIX) 40 MG tablet; Take 1 tablet (40 mg total) by mouth once daily.  Dispense: 30 tablet; Refill: 1   8. Encounter for HCV screening test for low risk patient   - HCV Ab w Reflex to Quant PCR; Future   9. Class 2 obesity due to excess calories with  body mass index (BMI) of 36.0 to 36.9 in adult, unspecified whether serious comorbidity present     Today the patient states she still having chronic low back pain and bilateral leg pain from lumbar radiculopathy.  She also has history of esophageal stricture with previous dilations.  Patient has history of hypertension as well on arrival elevated blood pressure noted 165/101.  Patient is on the Benicar HCT alone.  Patient also smokes on a daily basis 10 cigarettes daily.  Patient has been taking the atorvastatin daily along with gabapentin.  She does go to pain management but cannot afford to go back to them as she is uninsured.  She was giving Norco's and she understands and knows we do not give opiates at this clinic.  Patient is willing to get the orange card and [place] discount.  Other labs at the last visit were unremarkable.  She is prediabetic.  Patient has significant anxiety and depression but no thoughts of suicidal ideation.  She is on low-dose Zoloft and hydroxyzine without much improvement in symptoms.  Patient has been taking the vitamin D as prescribed.  She takes Elavil in combination with gabapentin for neuropathic pain.  She also takes ibuprofen as well.  Patient has a prescription potassium she is yet to start with potassium 3.1 at the mobile medicine unit previously seen.  8/21 Patient returns today in follow-up for blood pressure on arrival blood pressure is 125/86.  She is still smoking 5 to 7 cigarettes daily.  She still suffers from stress anxiety and depression but is not suicidal.  The patient did not answer when physical therapy called to get her scheduled.  She does have chronic low back pain.  She is also working on the orange card has not yet achieved this once she does orthopedic surgery and neurology would be good referrals for her.  Patient had her phone cut off she had to get a new phone the numbers in the system now.  She states that the antidepressant we  started has not been of much use but she is not suicidal Patient needs a Pap smear.  Patient still has radicular pain.   10/05/22  Essential hypertension - Primary    Blood pressure now at goal and continue valsartan HCT      Relevant Medications   atorvastatin (LIPITOR) 20 MG tablet   valsartan-hydrochlorothiazide (DIOVAN-HCT) 160-25 MG tablet     Digestive   ESOPHAGEAL STRICTURE   Relevant Medications   pantoprazole (PROTONIX) 40 MG tablet     Nervous and Auditory   Lumbar radiculopathy    Patient given number for physical therapy told to call to make an appointment      Relevant Medications   amitriptyline (ELAVIL) 50 MG tablet   gabapentin (NEURONTIN) 300 MG capsule   hydrOXYzine (ATARAX) 25 MG tablet   sertraline (ZOLOFT) 100 MG tablet     Other   GAD (generalized anxiety disorder)   Relevant Medications   amitriptyline (ELAVIL) 50 MG tablet   hydrOXYzine (ATARAX) 25 MG tablet   sertraline (ZOLOFT)  100 MG tablet   TOBACCO ABUSE       Current smoking consumption amount: 10 cigarettes daily  Dicsussion on advise to quit smoking and smoking impacts: Cardiovascular impacts  Patient's willingness to quit: Wants to quit  Methods to quit smoking discussed: Behavioral modification  Medication management of smoking session drugs discussed: Nicotine replacement  Resources provided:  AVS   Setting quit date not established Follow-up arranged 1 month   Time spent counseling the patient: 5 minutes      Severe episode of recurrent major depressive disorder, without psychotic features ([place])    We will get social work to have a visit for this patient and continue sertraline      Relevant Medications   amitriptyline (ELAVIL) 50 MG tablet   hydrOXYzine (ATARAX) 25 MG tablet   sertraline (ZOLOFT) 100 MG tablet   Hypokalemia    Need to reassess metabolic panel she was given potassium at the last visit for hyperkalemia      Relevant Orders   BMP8+eGFR    RESOLVED: LEG PAIN, BILATERAL   Relevant Medications   amitriptyline (ELAVIL) 50 MG tablet   gabapentin (NEURONTIN) 300 MG capsule   Other Visit Diagnoses     Familial hypercholesterolemia       Relevant Medications   atorvastatin (LIPITOR) 20 MG tablet   valsartan-hydrochlorothiazide (DIOVAN-HCT) 160-25 MG tablet   Vitamin D deficiency       Relevant Medications   Vitamin D, Ergocalciferol, (DRISDOL) 1.25 MG (50000 UNIT) CAPS capsule   Outpatient Encounter Medications as of 10/05/2022  Medication Sig   albuterol (VENTOLIN HFA) 108 (90 Base) MCG/ACT inhaler Inhale 2 puffs into the lungs every 4 (four) hours as needed.   amLODipine (NORVASC) 10 MG tablet Take 1 tablet (10 mg total) by mouth daily.   atorvastatin (LIPITOR) 20 MG tablet Take 1 tablet (20 mg total) by mouth daily.   gabapentin (NEURONTIN) 300 MG capsule Take 1 capsule (300 mg total) by mouth once nightly at bedtime.   hydrOXYzine (ATARAX) 25 MG tablet Take 1-2 tablets (25-50 mg total) by mouth 3 (three) times daily as needed.   ibuprofen (ADVIL) 200 MG tablet Take 800 mg by mouth daily as needed for headache.   pantoprazole (PROTONIX) 40 MG tablet Take 1 tablet (40 mg total) by mouth once daily.   tiZANidine (ZANAFLEX) 4 MG tablet Take 4 mg by mouth 3 (three) times daily.   valsartan-hydrochlorothiazide (DIOVAN-HCT) 160-25 MG tablet Take 1 tablet by mouth daily.   venlafaxine XR (EFFEXOR XR) 75 MG 24 hr capsule Take 1 capsule (75 mg total) by mouth daily with breakfast.   No facility-administered encounter medications on file as of 10/05/2022.    Past Medical History:  Diagnosis Date   Anxiety    history - no meds   Arthritis    bulging disc L4/5  L5/S1   Chronic back pain    Cystitis 07/2008   under care of [facility]   Depression    history - no meds   GERD (gastroesophageal reflux disease)    H/O hiatal hernia    no meds - diet controlled   Hepatitis    History Hep A at age 58 yrs - no prev problems    Hyperlipidemia    Hypertension    Insomnia    Macular degeneration 2013   Age related per pt - no meds   Pain, dental 2010   history - teeth ext per patient   Plantar fasciitis  s/p left gastroc slide    S/P dilatation of esophageal stricture 2006-2008    Past Surgical History:  Procedure Laterality Date   [place]   COLONOSCOPY     dilatation of esophageal stricture  2005-2008   DILATION AND EVACUATION  02/09/2012   Procedure: DILATATION AND EVACUATION;  Surgeon: Osborne Oman, MD;  Location: [facility];  Service: Gynecology;  Laterality: N/A;  With ultrasound guidance   DILATION AND EVACUATION N/A 08/29/2014   Procedure: DILATATION AND EVACUATION (D&E) 2ND TRIMESTER;  Surgeon: Mora Bellman, MD;  Location: [facility];  Service: Gynecology;  Laterality: N/A;   ESOPHAGOGASTRODUODENOSCOPY N/A 09/30/2017   Procedure: ESOPHAGOGASTRODUODENOSCOPY (EGD);  Surgeon: Wonda Horner, MD;  Location: [facility];  Service: Endoscopy;  Laterality: N/A;   gastroc slide  2005   left side    Pancystourethroscopy     plantar fasciitis     left foot   SVD  1999   x 1    Family History  Problem Relation Age of Onset   Diabetes Mother    Hyperlipidemia Mother    Stroke Mother    Hypertension Mother    Arthritis Mother    Heart disease Father    Hypertension Father    Hypertension Brother     Social History   Socioeconomic History   Marital status: Single    Spouse name: Not on file   Number of children: Not on file   Years of education: Not on file   Highest education level: Not on file  Occupational History   Not on file  Tobacco Use   Smoking status: Every Day    Packs/day: 0.25    Years: 8.00    Total pack years: 2.00    Types: Cigarettes   Smokeless tobacco: Never   Tobacco comments:    4-5 cigs/day  Substance and Sexual Activity   Alcohol use: Yes    Comment: rarely   Drug use: No   Sexual activity: Yes    Birth  control/protection: None  Other Topics Concern   Not on file  Social History Narrative   Current smoker 1/2 PPD   Denies alcohol use   2 children    Social Determinants of Radio broadcast assistant Strain: Not on file  Food Insecurity: Not on file  Transportation Needs: Not on file  Physical Activity: Not on file  Stress: Not on file  Social Connections: Not on file  Intimate Partner Violence: Not on file    Review of Systems  Constitutional:  Negative for chills, diaphoresis, fever, malaise/fatigue and weight loss.  HENT:  Negative for congestion, hearing loss, nosebleeds, sore throat and tinnitus.   Eyes:  Negative for blurred vision, photophobia and redness.  Respiratory:  Negative for cough, hemoptysis, sputum production, shortness of breath, wheezing and stridor.   Cardiovascular:  Negative for chest pain, palpitations, orthopnea, claudication, leg swelling and PND.  Gastrointestinal:  Negative for abdominal pain, blood in stool, constipation, diarrhea, heartburn, nausea and vomiting.  Genitourinary:  Negative for dysuria, flank pain, frequency, hematuria and urgency.  Musculoskeletal:  Positive for back pain. Negative for falls, joint pain, myalgias and neck pain.  Skin:  Negative for itching and rash.  Neurological:  Positive for tingling and weakness. Negative for dizziness, tremors, sensory change, speech change, focal weakness, seizures, loss of consciousness and headaches.  Endo/Heme/Allergies:  Negative for environmental allergies and polydipsia. Does not bruise/bleed easily.  Psychiatric/Behavioral:  Positive for depression. Negative for memory loss, substance abuse and suicidal ideas. The patient is nervous/anxious and has insomnia.         Objective    LMP 07/27/2019 (Approximate)   Physical Exam Vitals reviewed.  Constitutional:      Appearance: Normal appearance. She is well-developed. She is obese. She is not diaphoretic.  HENT:     Head:  Normocephalic and atraumatic.     Nose: No nasal deformity, septal deviation, mucosal edema or rhinorrhea.     Right Sinus: No maxillary sinus tenderness or frontal sinus tenderness.     Left Sinus: No maxillary sinus tenderness or frontal sinus tenderness.     Mouth/Throat:     Pharynx: No oropharyngeal exudate.  Eyes:     General: No scleral icterus.    Conjunctiva/sclera: Conjunctivae normal.     Pupils: Pupils are equal, round, and reactive to light.  Neck:     Thyroid: No thyromegaly.     Vascular: No carotid bruit or JVD.     Trachea: Trachea normal. No tracheal tenderness or tracheal deviation.  Cardiovascular:     Rate and Rhythm: Normal rate and regular rhythm.     Chest Wall: PMI is not displaced.     Pulses: Normal pulses. No decreased pulses.     Heart sounds: Normal heart sounds, S1 normal and S2 normal. Heart sounds not distant. No murmur heard.    No systolic murmur is present.     No diastolic murmur is present.     No friction rub. No gallop. No S3 or S4 sounds.  Pulmonary:     Effort: No tachypnea, accessory muscle usage or respiratory distress.     Breath sounds: No stridor. No decreased breath sounds, wheezing, rhonchi or rales.  Chest:     Chest wall: No tenderness.  Abdominal:     General: Bowel sounds are normal. There is no distension.     Palpations: Abdomen is soft. Abdomen is not rigid.     Tenderness: There is no abdominal tenderness. There is no guarding or rebound.  Musculoskeletal:        General: Tenderness present. Normal range of motion.     Cervical back: Normal range of motion and neck supple. No edema, erythema or rigidity. No muscular tenderness. Normal range of motion.     Comments: Tender in the low back area  Lymphadenopathy:     Head:     Right side of head: No submental or submandibular adenopathy.     Left side of head: No submental or submandibular adenopathy.     Cervical: No cervical adenopathy.  Skin:    General: Skin is warm  and dry.     Coloration: Skin is not pale.     Findings: No rash.     Nails: There is no clubbing.  Neurological:     Mental Status: She is alert and oriented to person, place, and time.     Sensory: No sensory deficit.  Psychiatric:        Attention and Perception: Attention and perception normal.        Mood and Affect: Mood is depressed.        Speech: Speech normal.        Behavior: Behavior is cooperative.        Thought Content: Thought content does not include homicidal or suicidal ideation. Thought content does not include homicidal or suicidal plan.        Cognition and Memory:  Cognition and memory normal.         Assessment & Plan:   Problem List Items Addressed This Visit   None 48 minutes spent complex decision making high multiple systems assessed Issued patient with colon cancer screening kit this visit Ordered Pap smear and mammogram via cancer control program No follow-ups on file.   Asencion Noble, MD

## 2022-10-05 ENCOUNTER — Other Ambulatory Visit (HOSPITAL_COMMUNITY): Payer: Self-pay | Admitting: Family Medicine

## 2022-10-05 ENCOUNTER — Ambulatory Visit: Payer: Medicaid Other | Attending: Critical Care Medicine | Admitting: Critical Care Medicine

## 2022-10-06 ENCOUNTER — Other Ambulatory Visit: Payer: Self-pay

## 2022-10-06 NOTE — Telephone Encounter (Signed)
Requested medication (s) are due for refill today - yes  Requested medication (s) are on the active medication list -yes  Future visit scheduled no  Last refill: 09/06/22 #30  Notes to clinic: non delegated Rx, outside provider Rx  Requested Prescriptions  Pending Prescriptions Disp Refills   venlafaxine XR (EFFEXOR XR) 75 MG 24 hr capsule 30 capsule 0    Sig: Take 1 capsule (75 mg total) by mouth daily with breakfast.     There is no refill protocol information for this order       Requested Prescriptions  Pending Prescriptions Disp Refills   venlafaxine XR (EFFEXOR XR) 75 MG 24 hr capsule 30 capsule 0    Sig: Take 1 capsule (75 mg total) by mouth daily with breakfast.     There is no refill protocol information for this order

## 2022-10-07 ENCOUNTER — Other Ambulatory Visit (HOSPITAL_COMMUNITY): Payer: Self-pay | Admitting: Family Medicine

## 2022-10-08 ENCOUNTER — Other Ambulatory Visit (HOSPITAL_COMMUNITY): Payer: Self-pay

## 2022-10-08 ENCOUNTER — Other Ambulatory Visit: Payer: Self-pay

## 2022-10-08 ENCOUNTER — Other Ambulatory Visit: Payer: Self-pay | Admitting: Critical Care Medicine

## 2022-10-08 DIAGNOSIS — K222 Esophageal obstruction: Secondary | ICD-10-CM

## 2022-10-08 MED ORDER — PANTOPRAZOLE SODIUM 40 MG PO TBEC
40.0000 mg | DELAYED_RELEASE_TABLET | Freq: Every day | ORAL | 0 refills | Status: DC
  Filled 2022-10-08 – 2022-10-12 (×2): qty 30, 30d supply, fill #0

## 2022-10-08 MED ORDER — VENLAFAXINE HCL ER 75 MG PO CP24
75.0000 mg | ORAL_CAPSULE | Freq: Every day | ORAL | 0 refills | Status: DC
  Filled 2022-10-08 – 2022-10-12 (×2): qty 30, 30d supply, fill #0

## 2022-10-08 NOTE — Telephone Encounter (Signed)
Requested medications are due for refill today.  yes  Requested medications are on the active medications list.  yes  Last refill. #30  0 rf 09/06/2022  Future visit scheduled.   no  Notes to clinic.  Rx signed by Molli Barrows. Pt is Over due for OV.    Requested Prescriptions  Pending Prescriptions Disp Refills   venlafaxine XR (EFFEXOR XR) 75 MG 24 hr capsule 30 capsule 0    Sig: Take 1 capsule (75 mg total) by mouth daily with breakfast.     There is no refill protocol information for this order

## 2022-10-12 ENCOUNTER — Other Ambulatory Visit (HOSPITAL_COMMUNITY): Payer: Self-pay | Admitting: Family Medicine

## 2022-10-12 ENCOUNTER — Other Ambulatory Visit (HOSPITAL_COMMUNITY): Payer: Self-pay

## 2022-10-12 ENCOUNTER — Other Ambulatory Visit: Payer: Self-pay

## 2022-10-12 MED ORDER — HYDROXYZINE HCL 25 MG PO TABS
25.0000 mg | ORAL_TABLET | Freq: Three times a day (TID) | ORAL | 1 refills | Status: DC | PRN
  Filled 2022-10-12: qty 60, 10d supply, fill #0
  Filled 2022-11-09: qty 60, 10d supply, fill #1

## 2022-10-13 ENCOUNTER — Other Ambulatory Visit (HOSPITAL_COMMUNITY): Payer: Self-pay

## 2022-10-13 ENCOUNTER — Other Ambulatory Visit: Payer: Self-pay

## 2022-10-19 ENCOUNTER — Other Ambulatory Visit (HOSPITAL_COMMUNITY): Payer: Self-pay

## 2022-10-19 ENCOUNTER — Telehealth: Payer: Self-pay | Admitting: Critical Care Medicine

## 2022-10-19 NOTE — Telephone Encounter (Signed)
Call ms Osika  she needs her mammogram done ordered last summer it is in the system Pls schedule this maybe at upcoming womens health event??  Order in system since last summer

## 2022-10-21 NOTE — Telephone Encounter (Signed)
Snapshot has been printed and placed in folder for scheduling and patient will be called

## 2022-11-09 ENCOUNTER — Other Ambulatory Visit: Payer: Self-pay | Admitting: Critical Care Medicine

## 2022-11-09 DIAGNOSIS — K222 Esophageal obstruction: Secondary | ICD-10-CM

## 2022-11-10 ENCOUNTER — Other Ambulatory Visit: Payer: Self-pay

## 2022-11-10 ENCOUNTER — Other Ambulatory Visit (HOSPITAL_COMMUNITY): Payer: Self-pay

## 2022-11-10 NOTE — Telephone Encounter (Signed)
Requested medication (s) are due for refill today: Yes  Requested medication (s) are on the active medication list: Yes  Last refill:  10/08/22  Future visit scheduled: No  Notes to clinic:  Left message to call and make appointment.    Requested Prescriptions  Pending Prescriptions Disp Refills   venlafaxine XR (EFFEXOR XR) 75 MG 24 hr capsule 30 capsule 0    Sig: Take 1 capsule (75 mg total) by mouth daily with breakfast. Patient needs an appointment.     Psychiatry: Antidepressants - SNRI - desvenlafaxine & venlafaxine Failed - 11/09/2022  4:26 PM      Failed - Cr in normal range and within 360 days    Creatinine  Date Value Ref Range Status  06/29/2018 0.83 0.44 - 1.00 mg/dL Final   Creatinine, Ser  Date Value Ref Range Status  05/03/2022 1.13 (H) 0.57 - 1.00 mg/dL Final         Failed - Valid encounter within last 6 months    Recent Outpatient Visits           6 months ago Essential hypertension   [place] Elsie Stain, MD   7 months ago Essential hypertension   [place] Elsie Stain, MD              Failed - Lipid Panel in normal range within the last 12 months    Cholesterol, Total  Date Value Ref Range Status  02/25/2022 343 (H) 100 - 199 mg/dL Final   Cholesterol  Date Value Ref Range Status  09/05/2022 267 (H) 0 - 200 mg/dL Final   LDL Chol Calc (NIH)  Date Value Ref Range Status  02/25/2022 218 (H) 0 - 99 mg/dL Final   LDL Cholesterol  Date Value Ref Range Status  09/05/2022 UNABLE TO CALCULATE IF TRIGLYCERIDE OVER 400 mg/dL 0 - 99 mg/dL Final    Comment:           Total Cholesterol/HDL:CHD Risk Coronary Heart Disease Risk Table                     Men   Women  1/2 Average Risk   3.4   3.3  Average Risk       5.0   4.4  2 X Average Risk   9.6   7.1  3 X Average Risk  23.4   11.0        Use the calculated Patient Ratio above and the CHD Risk Table to  determine the patient's CHD Risk.        ATP III CLASSIFICATION (LDL):  <100     mg/dL   Optimal  100-129  mg/dL   Near or Above                    Optimal  130-159  mg/dL   Borderline  160-189  mg/dL   High  >190     mg/dL   Very High Performed at [facility] [address], [place], [place] [address]    Direct LDL  Date Value Ref Range Status  03/29/2022 137 (H) 0 - 99 mg/dL Final    Comment:    Performed at [facility] [address], [place], [place] [address]   HDL  Date Value Ref Range Status  09/05/2022 53 >40 mg/dL Final  02/25/2022 56 >39 mg/dL Final  Triglycerides  Date Value Ref Range Status  09/05/2022 422 (H) <150 mg/dL Final         Passed - Completed PHQ-2 or PHQ-9 in the last 360 days      Passed - Last BP in normal range    BP Readings from Last 1 Encounters:  07/09/22 115/78          pantoprazole (PROTONIX) 40 MG tablet 30 tablet 0    Sig: Take 1 tablet (40 mg total) by mouth once daily.     Gastroenterology: Proton Pump Inhibitors Passed - 11/09/2022  4:26 PM      Passed - Valid encounter within last 12 months    Recent Outpatient Visits           6 months ago Essential hypertension   [place] Elsie Stain, MD   7 months ago Essential hypertension   [place] Elsie Stain, MD

## 2022-11-26 ENCOUNTER — Other Ambulatory Visit: Payer: Self-pay | Admitting: Critical Care Medicine

## 2022-11-26 DIAGNOSIS — K222 Esophageal obstruction: Secondary | ICD-10-CM

## 2022-12-01 ENCOUNTER — Other Ambulatory Visit: Payer: Self-pay | Admitting: Critical Care Medicine

## 2022-12-01 DIAGNOSIS — K222 Esophageal obstruction: Secondary | ICD-10-CM

## 2022-12-02 ENCOUNTER — Other Ambulatory Visit: Payer: Self-pay | Admitting: Critical Care Medicine

## 2022-12-02 ENCOUNTER — Other Ambulatory Visit: Payer: Self-pay

## 2022-12-02 NOTE — Telephone Encounter (Signed)
Requested medication (s) are due for refill today: expired medication- effexor.   Requested medication (s) are on the active medication list: yes  Last refill:  effexor- 10/08/22- 11/11/22 #30 0 refills, protonix- 10/08/22 #30 0 refills, hydroxizine- 10/12/22 #60 1 refills  Future visit scheduled: no  Notes to clinic:  expired medication - effexor called patient to schedule appt for med refills no answer, LVTCB. Do you want to renew Rx? No refill remains for protonix, protocol failed last labs 02/25/22 . Do you want to refill Rxs?     Requested Prescriptions  Pending Prescriptions Disp Refills   venlafaxine XR (EFFEXOR XR) 75 MG 24 hr capsule 30 capsule 0    Sig: Take 1 capsule (75 mg total) by mouth daily with breakfast. Patient needs an appointment.     Psychiatry: Antidepressants - SNRI - desvenlafaxine & venlafaxine Failed - 12/01/2022  9:01 PM      Failed - Cr in normal range and within 360 days    Creatinine  Date Value Ref Range Status  06/29/2018 0.83 0.44 - 1.00 mg/dL Final   Creatinine, Ser  Date Value Ref Range Status  05/03/2022 1.13 (H) 0.57 - 1.00 mg/dL Final         Failed - Valid encounter within last 6 months    Recent Outpatient Visits           7 months ago Essential hypertension   [place] Elsie Stain, MD   8 months ago Essential hypertension   [place] Elsie Stain, MD              Failed - Lipid Panel in normal range within the last 12 months    Cholesterol, Total  Date Value Ref Range Status  02/25/2022 343 (H) 100 - 199 mg/dL Final   Cholesterol  Date Value Ref Range Status  09/05/2022 267 (H) 0 - 200 mg/dL Final   LDL Chol Calc (NIH)  Date Value Ref Range Status  02/25/2022 218 (H) 0 - 99 mg/dL Final   LDL Cholesterol  Date Value Ref Range Status  09/05/2022 UNABLE TO CALCULATE IF TRIGLYCERIDE OVER 400 mg/dL 0 - 99 mg/dL Final    Comment:            Total Cholesterol/HDL:CHD Risk Coronary Heart Disease Risk Table                     Men   Women  1/2 Average Risk   3.4   3.3  Average Risk       5.0   4.4  2 X Average Risk   9.6   7.1  3 X Average Risk  23.4   11.0        Use the calculated Patient Ratio above and the CHD Risk Table to determine the patient's CHD Risk.        ATP III CLASSIFICATION (LDL):  <100     mg/dL   Optimal  100-129  mg/dL   Near or Above                    Optimal  130-159  mg/dL   Borderline  160-189  mg/dL   High  >190     mg/dL   Very High Performed at [facility] [address] [place], [place] [address]    Direct LDL  Date Value Ref Range Status  03/29/2022  137 (H) 0 - 99 mg/dL Final    Comment:    Performed at [facility], [place] [address], [place], [place] [address]   HDL  Date Value Ref Range Status  09/05/2022 53 >40 mg/dL Final  02/25/2022 56 >39 mg/dL Final   Triglycerides  Date Value Ref Range Status  09/05/2022 422 (H) <150 mg/dL Final         Passed - Completed PHQ-2 or PHQ-9 in the last 360 days      Passed - Last BP in normal range    BP Readings from Last 1 Encounters:  07/09/22 115/78          pantoprazole (PROTONIX) 40 MG tablet 30 tablet 0    Sig: Take 1 tablet (40 mg total) by mouth once daily.     Gastroenterology: Proton Pump Inhibitors Passed - 12/01/2022  9:01 PM      Passed - Valid encounter within last 12 months    Recent Outpatient Visits           7 months ago Essential hypertension   [place], MD   8 months ago Essential hypertension   [place] Elsie Stain, MD               hydrOXYzine (ATARAX) 25 MG tablet 60 tablet 1    Sig: Take 1-2 tablets (25-50 mg total) by mouth 3 (three) times daily as needed.     Ear, Nose, and Throat:  Antihistamines 2 Failed - 12/01/2022  9:01 PM      Failed - Cr in normal range and within 360 days     Creatinine  Date Value Ref Range Status  06/29/2018 0.83 0.44 - 1.00 mg/dL Final   Creatinine, Ser  Date Value Ref Range Status  05/03/2022 1.13 (H) 0.57 - 1.00 mg/dL Final         Passed - Valid encounter within last 12 months    Recent Outpatient Visits           7 months ago Essential hypertension   [place] Elsie Stain, MD   8 months ago Essential hypertension   [place] Elsie Stain, MD

## 2022-12-02 NOTE — Telephone Encounter (Signed)
Requested medications are due for refill today.  yes  Requested medications are on the active medications list.  yes  Last refill. 10/12/2022 #60 1 rf  Future visit scheduled.   no  Notes to clinic.  Please review for refill.    Requested Prescriptions  Pending Prescriptions Disp Refills   hydrOXYzine (ATARAX) 25 MG tablet 60 tablet 1    Sig: Take 1-2 tablets (25-50 mg total) by mouth 3 (three) times daily as needed.     Ear, Nose, and Throat:  Antihistamines 2 Failed - 12/02/2022  2:12 PM      Failed - Cr in normal range and within 360 days    Creatinine  Date Value Ref Range Status  06/29/2018 0.83 0.44 - 1.00 mg/dL Final   Creatinine, Ser  Date Value Ref Range Status  05/03/2022 1.13 (H) 0.57 - 1.00 mg/dL Final         Passed - Valid encounter within last 12 months    Recent Outpatient Visits           7 months ago Essential hypertension   [place] Elsie Stain, MD   8 months ago Essential hypertension   [place] Elsie Stain, MD

## 2022-12-02 NOTE — Telephone Encounter (Signed)
Called patient to schedule appt for medication refills. No answer, LVMTCB. 

## 2022-12-02 NOTE — Telephone Encounter (Signed)
Called pt left message on machine to call back for appt.

## 2022-12-03 ENCOUNTER — Other Ambulatory Visit: Payer: Self-pay | Admitting: Critical Care Medicine

## 2022-12-03 ENCOUNTER — Other Ambulatory Visit: Payer: Self-pay

## 2022-12-03 DIAGNOSIS — M79604 Pain in right leg: Secondary | ICD-10-CM

## 2022-12-03 DIAGNOSIS — K222 Esophageal obstruction: Secondary | ICD-10-CM

## 2022-12-03 DIAGNOSIS — E78019 Familial hypercholesterolemia, unspecified: Secondary | ICD-10-CM

## 2022-12-03 DIAGNOSIS — E7801 Familial hypercholesterolemia: Secondary | ICD-10-CM

## 2022-12-03 MED ORDER — PANTOPRAZOLE SODIUM 40 MG PO TBEC
40.0000 mg | DELAYED_RELEASE_TABLET | Freq: Every day | ORAL | 0 refills | Status: DC
  Filled 2022-12-03: qty 30, 30d supply, fill #0

## 2022-12-03 MED ORDER — GABAPENTIN 300 MG PO CAPS
300.0000 mg | ORAL_CAPSULE | Freq: Every day | ORAL | 2 refills | Status: AC
  Filled 2022-12-03: qty 30, 30d supply, fill #0
  Filled 2022-12-07 – 2023-01-02 (×2): qty 30, 30d supply, fill #1
  Filled 2023-01-28: qty 30, 30d supply, fill #2

## 2022-12-03 MED ORDER — HYDROXYZINE HCL 25 MG PO TABS
25.0000 mg | ORAL_TABLET | Freq: Three times a day (TID) | ORAL | 0 refills | Status: DC | PRN
  Filled 2022-12-03: qty 60, 10d supply, fill #0

## 2022-12-03 MED ORDER — ATORVASTATIN CALCIUM 20 MG PO TABS
20.0000 mg | ORAL_TABLET | Freq: Every day | ORAL | 2 refills | Status: AC
  Filled 2022-12-03: qty 30, 30d supply, fill #0
  Filled 2023-01-02: qty 30, 30d supply, fill #1
  Filled 2023-02-09: qty 30, 30d supply, fill #2

## 2022-12-03 MED ORDER — AMLODIPINE BESYLATE 10 MG PO TABS
10.0000 mg | ORAL_TABLET | Freq: Every day | ORAL | 0 refills | Status: DC
  Filled 2022-12-03: qty 30, 30d supply, fill #0

## 2022-12-04 ENCOUNTER — Other Ambulatory Visit (HOSPITAL_COMMUNITY): Payer: Self-pay

## 2022-12-06 ENCOUNTER — Other Ambulatory Visit: Payer: Self-pay

## 2022-12-07 ENCOUNTER — Other Ambulatory Visit: Payer: Self-pay

## 2022-12-13 ENCOUNTER — Other Ambulatory Visit (HOSPITAL_COMMUNITY): Payer: Self-pay

## 2023-01-02 ENCOUNTER — Other Ambulatory Visit: Payer: Self-pay | Admitting: Critical Care Medicine

## 2023-01-02 DIAGNOSIS — K222 Esophageal obstruction: Secondary | ICD-10-CM

## 2023-01-03 ENCOUNTER — Other Ambulatory Visit: Payer: Self-pay

## 2023-01-03 NOTE — Telephone Encounter (Signed)
Requested medication (s) are due for refill today: yes  Requested medication (s) are on the active medication list: yes  Last refill:  12/03/22  Future visit scheduled: no  Notes to clinic:  Unable to refill per protocol, courtesy refill already given, routing for provider approval.      Requested Prescriptions  Pending Prescriptions Disp Refills   amLODipine (NORVASC) 10 MG tablet 30 tablet 0    Sig: Take 1 tablet (10 mg total) by mouth daily. Must have office visit for refills     Cardiovascular: Calcium Channel Blockers 2 Failed - 01/02/2023 12:21 PM      Failed - Last Heart Rate in normal range    Pulse Readings from Last 1 Encounters:  07/09/22 95         Failed - Valid encounter within last 6 months    Recent Outpatient Visits           8 months ago Essential hypertension   [place] [facility] Frisk, [facility]   9 months ago Essential hypertension   [place] [facility] Storm Frisk, MD              Passed - Last BP in normal range    BP Readings from Last 1 Encounters:  07/09/22 115/78          pantoprazole (PROTONIX) 40 MG tablet 30 tablet 0    Sig: Take 1 tablet (40 mg total) by mouth daily. Must have office visit for refills     Gastroenterology: Proton Pump Inhibitors Passed - 01/02/2023 12:21 PM      Passed - Valid encounter within last 12 months    Recent Outpatient Visits           8 months ago Essential hypertension   [place] [facility] Storm Frisk, MD   9 months ago Essential hypertension   [place] [facility] Storm Frisk, MD

## 2023-01-28 ENCOUNTER — Other Ambulatory Visit: Payer: Self-pay | Admitting: Critical Care Medicine

## 2023-01-28 DIAGNOSIS — K222 Esophageal obstruction: Secondary | ICD-10-CM

## 2023-01-31 ENCOUNTER — Other Ambulatory Visit: Payer: Self-pay | Admitting: Critical Care Medicine

## 2023-01-31 ENCOUNTER — Other Ambulatory Visit: Payer: Self-pay

## 2023-01-31 DIAGNOSIS — K222 Esophageal obstruction: Secondary | ICD-10-CM

## 2023-01-31 NOTE — Telephone Encounter (Signed)
Rx 12/03/22 #30- must have OV for RF notes. Requested Prescriptions  Pending Prescriptions Disp Refills   pantoprazole (PROTONIX) 40 MG tablet 30 tablet 0    Sig: Take 1 tablet (40 mg total) by mouth daily. Must have office visit for refills     Gastroenterology: Proton Pump Inhibitors Passed - 01/28/2023 11:39 PM      Passed - Valid encounter within last 12 months    Recent Outpatient Visits           9 months ago Essential hypertension   [place] [facility] Storm Frisk, MD   10 months ago Essential hypertension   [place] [facility] Storm Frisk, MD

## 2023-02-09 ENCOUNTER — Other Ambulatory Visit: Payer: Self-pay | Admitting: Critical Care Medicine

## 2023-02-09 ENCOUNTER — Other Ambulatory Visit: Payer: Self-pay

## 2023-02-09 ENCOUNTER — Other Ambulatory Visit (HOSPITAL_COMMUNITY): Payer: Self-pay

## 2023-02-09 DIAGNOSIS — K222 Esophageal obstruction: Secondary | ICD-10-CM

## 2023-02-09 MED ORDER — HYDROXYZINE HCL 25 MG PO TABS
25.0000 mg | ORAL_TABLET | Freq: Three times a day (TID) | ORAL | 0 refills | Status: AC | PRN
  Filled 2023-02-09: qty 60, 10d supply, fill #0

## 2023-02-09 MED ORDER — VENLAFAXINE HCL ER 75 MG PO CP24
75.0000 mg | ORAL_CAPSULE | Freq: Every day | ORAL | 0 refills | Status: AC
  Filled 2023-02-09: qty 30, 30d supply, fill #0

## 2023-02-09 MED ORDER — PANTOPRAZOLE SODIUM 40 MG PO TBEC
40.0000 mg | DELAYED_RELEASE_TABLET | Freq: Every day | ORAL | 0 refills | Status: AC
  Filled 2023-02-09: qty 30, 30d supply, fill #0

## 2023-02-09 MED ORDER — AMLODIPINE BESYLATE 10 MG PO TABS
10.0000 mg | ORAL_TABLET | Freq: Every day | ORAL | 0 refills | Status: AC
  Filled 2023-02-09: qty 30, 30d supply, fill #0

## 2023-03-02 ENCOUNTER — Other Ambulatory Visit: Payer: Self-pay | Admitting: Critical Care Medicine

## 2023-03-02 ENCOUNTER — Other Ambulatory Visit (HOSPITAL_COMMUNITY): Payer: Self-pay

## 2023-03-02 DIAGNOSIS — M79605 Pain in left leg: Secondary | ICD-10-CM

## 2023-03-03 ENCOUNTER — Other Ambulatory Visit (HOSPITAL_COMMUNITY): Payer: Self-pay

## 2023-03-03 ENCOUNTER — Other Ambulatory Visit: Payer: Self-pay

## 2023-03-04 ENCOUNTER — Other Ambulatory Visit: Payer: Self-pay | Admitting: Critical Care Medicine

## 2023-03-04 DIAGNOSIS — M79604 Pain in right leg: Secondary | ICD-10-CM

## 2023-03-04 DIAGNOSIS — E7801 Familial hypercholesterolemia: Secondary | ICD-10-CM

## 2023-03-04 DIAGNOSIS — K222 Esophageal obstruction: Secondary | ICD-10-CM

## 2023-03-05 ENCOUNTER — Other Ambulatory Visit (HOSPITAL_COMMUNITY): Payer: Self-pay

## 2023-03-07 ENCOUNTER — Other Ambulatory Visit: Payer: Self-pay | Admitting: Critical Care Medicine

## 2023-03-07 DIAGNOSIS — M79604 Pain in right leg: Secondary | ICD-10-CM

## 2023-03-07 DIAGNOSIS — E7801 Familial hypercholesterolemia: Secondary | ICD-10-CM

## 2023-03-07 DIAGNOSIS — K222 Esophageal obstruction: Secondary | ICD-10-CM

## 2023-03-08 NOTE — Telephone Encounter (Signed)
Called pt - left message on machine to call back and schedule OV. 

## 2023-03-08 NOTE — Telephone Encounter (Signed)
Requested medications are due for refill today.  yes  Requested medications are on the active medications list.  yes  Last refill. varied  Future visit scheduled.   no  Notes to clinic.  Pt is overdue for OV. Called pt and left message to call back and schedule ov.    Requested Prescriptions  Pending Prescriptions Disp Refills   gabapentin (NEURONTIN) 300 MG capsule 30 capsule 2    Sig: Take 1 capsule (300 mg total) by mouth once nightly at bedtime.     Neurology: Anticonvulsants - gabapentin Failed - 03/07/2023 10:05 PM      Failed - Cr in normal range and within 360 days    Creatinine  Date Value Ref Range Status  06/29/2018 0.83 0.44 - 1.00 mg/dL Final   Creatinine, Ser  Date Value Ref Range Status  05/03/2022 1.13 (H) 0.57 - 1.00 mg/dL Final         Passed - Completed PHQ-2 or PHQ-9 in the last 360 days      Passed - Valid encounter within last 12 months    Recent Outpatient Visits           10 months ago Essential hypertension   [place] [facility] Storm Frisk, MD   11 months ago Essential hypertension   [place] [facility] Storm Frisk, MD               atorvastatin (LIPITOR) 20 MG tablet 30 tablet 2    Sig: Take 1 tablet (20 mg total) by mouth daily.     Cardiovascular:  Antilipid - Statins Failed - 03/07/2023 10:05 PM      Failed - Lipid Panel in normal range within the last 12 months    Cholesterol, Total  Date Value Ref Range Status  02/25/2022 343 (H) 100 - 199 mg/dL Final   Cholesterol  Date Value Ref Range Status  09/05/2022 267 (H) 0 - 200 mg/dL Final   LDL Chol Calc (NIH)  Date Value Ref Range Status  02/25/2022 218 (H) 0 - 99 mg/dL Final   LDL Cholesterol  Date Value Ref Range Status  09/05/2022 UNABLE TO CALCULATE IF TRIGLYCERIDE OVER 400 mg/dL 0 - 99 mg/dL Final    Comment:           Total Cholesterol/HDL:CHD Risk Coronary Heart Disease Risk Table                      Men   Women  1/2 Average Risk   3.4   3.3  Average Risk       5.0   4.4  2 X Average Risk   9.6   7.1  3 X Average Risk  23.4   11.0        Use the calculated Patient Ratio above and the CHD Risk Table to determine the patient's CHD Risk.        ATP III CLASSIFICATION (LDL):  <100     mg/dL   Optimal  161-096  mg/dL   Near or Above                    Optimal  130-159  mg/dL   Borderline  045-409  mg/dL   High  >811     mg/dL   Very High Performed at [facility], [address] [place], [place] [address]    Direct LDL  Date Value  Ref Range Status  03/29/2022 137 (H) 0 - 99 mg/dL Final    Comment:    Performed at [facility], [address] [place], [place] [address]   HDL  Date Value Ref Range Status  09/05/2022 53 >40 mg/dL Final  13/04/6577 56 >46 mg/dL Final   Triglycerides  Date Value Ref Range Status  09/05/2022 422 (H) <150 mg/dL Final         Passed - Patient is not pregnant      Passed - Valid encounter within last 12 months    Recent Outpatient Visits           10 months ago Essential hypertension   [place] [facility] Storm Frisk, MD   11 months ago Essential hypertension   [place] [facility] Storm Frisk, MD               venlafaxine XR (EFFEXOR XR) 75 MG 24 hr capsule 30 capsule 0    Sig: Take 1 capsule (75 mg total) by mouth daily with breakfast. Patient needs an appointment.     Psychiatry: Antidepressants - SNRI - desvenlafaxine & venlafaxine Failed - 03/07/2023 10:05 PM      Failed - Cr in normal range and within 360 days    Creatinine  Date Value Ref Range Status  06/29/2018 0.83 0.44 - 1.00 mg/dL Final   Creatinine, Ser  Date Value Ref Range Status  05/03/2022 1.13 (H) 0.57 - 1.00 mg/dL Final         Failed - Valid encounter within last 6 months    Recent Outpatient Visits           10 months ago Essential hypertension   [place] [facility] Storm Frisk, MD   11 months ago Essential hypertension   [place] [facility] Storm Frisk, MD              Failed - Lipid Panel in normal range within the last 12 months    Cholesterol, Total  Date Value Ref Range Status  02/25/2022 343 (H) 100 - 199 mg/dL Final   Cholesterol  Date Value Ref Range Status  09/05/2022 267 (H) 0 - 200 mg/dL Final   LDL Chol Calc (NIH)  Date Value Ref Range Status  02/25/2022 218 (H) 0 - 99 mg/dL Final   LDL Cholesterol  Date Value Ref Range Status  09/05/2022 UNABLE TO CALCULATE IF TRIGLYCERIDE OVER 400 mg/dL 0 - 99 mg/dL Final    Comment:           Total Cholesterol/HDL:CHD Risk Coronary Heart Disease Risk Table                     Men   Women  1/2 Average Risk   3.4   3.3  Average Risk       5.0   4.4  2 X Average Risk   9.6   7.1  3 X Average Risk  23.4   11.0        Use the calculated Patient Ratio above and the CHD Risk Table to determine the patient's CHD Risk.        ATP III CLASSIFICATION (LDL):  <100     mg/dL   Optimal  962-952  mg/dL   Near or Above  Optimal  130-159  mg/dL   Borderline  956-213  mg/dL   High  >086     mg/dL   Very High Performed at [facility], [address] [place], [place] [address]    Direct LDL  Date Value Ref Range Status  03/29/2022 137 (H) 0 - 99 mg/dL Final    Comment:    Performed at [facility], [address], [place], [place] [address]   HDL  Date Value Ref Range Status  09/05/2022 53 >40 mg/dL Final  28/41/3244 56 >01 mg/dL Final   Triglycerides  Date Value Ref Range Status  09/05/2022 422 (H) <150 mg/dL Final         Passed - Completed PHQ-2 or PHQ-9 in the last 360 days      Passed - Last BP in normal range    BP Readings from Last 1 Encounters:  07/09/22 115/78          hydrOXYzine (ATARAX) 25 MG tablet 60 tablet 0    Sig: Take 1-2 tablets (25-50 mg total) by mouth 3  (three) times daily as needed. Please schedule PCP appt.     Ear, Nose, and Throat:  Antihistamines 2 Failed - 03/07/2023 10:05 PM      Failed - Cr in normal range and within 360 days    Creatinine  Date Value Ref Range Status  06/29/2018 0.83 0.44 - 1.00 mg/dL Final   Creatinine, Ser  Date Value Ref Range Status  05/03/2022 1.13 (H) 0.57 - 1.00 mg/dL Final         Passed - Valid encounter within last 12 months    Recent Outpatient Visits           10 months ago Essential hypertension   [place] [facility] Storm Frisk, MD   11 months ago Essential hypertension   [place] [facility] Storm Frisk, MD               amLODipine (NORVASC) 10 MG tablet 30 tablet 0    Sig: Take 1 tablet (10 mg total) by mouth daily. Must have office visit for refills     Cardiovascular: Calcium Channel Blockers 2 Failed - 03/07/2023 10:05 PM      Failed - Last Heart Rate in normal range    Pulse Readings from Last 1 Encounters:  07/09/22 95         Failed - Valid encounter within last 6 months    Recent Outpatient Visits           10 months ago Essential hypertension   [place] [facility] Storm Frisk, MD   11 months ago Essential hypertension   [place] [facility] Storm Frisk, MD              Passed - Last BP in normal range    BP Readings from Last 1 Encounters:  07/09/22 115/78          pantoprazole (PROTONIX) 40 MG tablet 30 tablet 0    Sig: Take 1 tablet (40 mg total) by mouth daily. Must have office visit for refills     Gastroenterology: Proton Pump Inhibitors Passed - 03/07/2023 10:05 PM      Passed - Valid encounter within last 12 months    Recent Outpatient Visits           10 months ago  Essential hypertension   [place] [facility] Storm Frisk, MD   11 months ago Essential hypertension   [place]  [facility] Storm Frisk, MD

## 2023-03-10 ENCOUNTER — Other Ambulatory Visit (HOSPITAL_COMMUNITY): Payer: Self-pay

## 2023-06-03 ENCOUNTER — Other Ambulatory Visit: Payer: Self-pay | Admitting: Family

## 2023-06-03 ENCOUNTER — Ambulatory Visit
Admission: RE | Admit: 2023-06-03 | Discharge: 2023-06-03 | Disposition: A | Discharge status: Home / Self Care | Payer: Medicaid Other | Source: Ambulatory Visit | Attending: Family | Admitting: Family

## 2023-06-03 DIAGNOSIS — R051 Acute cough: Secondary | ICD-10-CM

## 2023-06-03 DIAGNOSIS — M542 Cervicalgia: Secondary | ICD-10-CM

## 2023-06-03 DIAGNOSIS — M25512 Pain in left shoulder: Secondary | ICD-10-CM

## 2024-02-10 ENCOUNTER — Other Ambulatory Visit: Payer: Self-pay | Admitting: Neurosurgery

## 2024-02-10 DIAGNOSIS — D492 Neoplasm of unspecified behavior of bone, soft tissue, and skin: Secondary | ICD-10-CM

## 2024-03-27 ENCOUNTER — Other Ambulatory Visit: Payer: Self-pay | Admitting: Neurosurgery

## 2024-03-27 DIAGNOSIS — D492 Neoplasm of unspecified behavior of bone, soft tissue, and skin: Secondary | ICD-10-CM

## 2024-03-30 ENCOUNTER — Encounter: Payer: Self-pay | Admitting: Neurosurgery

## 2024-04-12 ENCOUNTER — Ambulatory Visit
Admission: RE | Admit: 2024-04-12 | Discharge: 2024-04-12 | Disposition: A | Discharge status: Home / Self Care | Source: Ambulatory Visit | Attending: Neurosurgery | Admitting: Neurosurgery

## 2024-04-12 DIAGNOSIS — D492 Neoplasm of unspecified behavior of bone, soft tissue, and skin: Secondary | ICD-10-CM

## 2024-04-12 MED ORDER — GADOPICLENOL 0.5 MMOL/ML IV SOLN
9.0000 mL | Freq: Once | INTRAVENOUS | Status: AC | PRN
  Administered 2024-04-12: 9 mL via INTRAVENOUS
